# Patient Record
Sex: Female | Born: 1985 | Race: White | Hispanic: No | Marital: Married | Smoking: Never smoker
Health system: Southern US, Community
[De-identification: ages and names within clinical notes are randomized; demographics above are authoritative.]

## PROBLEM LIST (undated history)

## (undated) ENCOUNTER — Inpatient Hospital Stay (HOSPITAL_COMMUNITY): Payer: Self-pay

## (undated) DIAGNOSIS — I10 Essential (primary) hypertension: Secondary | ICD-10-CM

## (undated) DIAGNOSIS — N200 Calculus of kidney: Secondary | ICD-10-CM

## (undated) DIAGNOSIS — O99119 Other diseases of the blood and blood-forming organs and certain disorders involving the immune mechanism complicating pregnancy, unspecified trimester: Secondary | ICD-10-CM

## (undated) DIAGNOSIS — Z803 Family history of malignant neoplasm of breast: Secondary | ICD-10-CM

## (undated) DIAGNOSIS — Z800 Family history of malignant neoplasm of digestive organs: Secondary | ICD-10-CM

## (undated) DIAGNOSIS — F419 Anxiety disorder, unspecified: Secondary | ICD-10-CM

## (undated) DIAGNOSIS — O139 Gestational [pregnancy-induced] hypertension without significant proteinuria, unspecified trimester: Secondary | ICD-10-CM

## (undated) DIAGNOSIS — G43909 Migraine, unspecified, not intractable, without status migrainosus: Secondary | ICD-10-CM

## (undated) DIAGNOSIS — Z808 Family history of malignant neoplasm of other organs or systems: Secondary | ICD-10-CM

## (undated) DIAGNOSIS — K589 Irritable bowel syndrome without diarrhea: Secondary | ICD-10-CM

## (undated) DIAGNOSIS — C4491 Basal cell carcinoma of skin, unspecified: Secondary | ICD-10-CM

## (undated) DIAGNOSIS — D696 Thrombocytopenia, unspecified: Secondary | ICD-10-CM

## (undated) DIAGNOSIS — K219 Gastro-esophageal reflux disease without esophagitis: Secondary | ICD-10-CM

## (undated) DIAGNOSIS — E039 Hypothyroidism, unspecified: Secondary | ICD-10-CM

## (undated) HISTORY — DX: Calculus of kidney: N20.0

## (undated) HISTORY — PX: DENTAL SURGERY: SHX609

## (undated) HISTORY — DX: Family history of malignant neoplasm of breast: Z80.3

## (undated) HISTORY — DX: Migraine, unspecified, not intractable, without status migrainosus: G43.909

## (undated) HISTORY — DX: Family history of malignant neoplasm of other organs or systems: Z80.8

## (undated) HISTORY — DX: Family history of malignant neoplasm of digestive organs: Z80.0

## (undated) HISTORY — DX: Irritable bowel syndrome, unspecified: K58.9

## (undated) HISTORY — PX: BASAL CELL CARCINOMA EXCISION: SHX1214

## (undated) HISTORY — DX: Basal cell carcinoma of skin, unspecified: C44.91

---

## 2007-07-29 ENCOUNTER — Other Ambulatory Visit: Admission: RE | Admit: 2007-07-29 | Discharge: 2007-07-29 | Payer: Self-pay | Admitting: Family Medicine

## 2013-06-13 ENCOUNTER — Encounter: Payer: Self-pay | Admitting: Certified Nurse Midwife

## 2013-06-13 ENCOUNTER — Ambulatory Visit: Payer: Self-pay | Admitting: Certified Nurse Midwife

## 2013-06-14 ENCOUNTER — Ambulatory Visit: Payer: Self-pay | Admitting: Certified Nurse Midwife

## 2013-06-22 ENCOUNTER — Encounter: Payer: Self-pay | Admitting: Certified Nurse Midwife

## 2013-06-22 ENCOUNTER — Ambulatory Visit (INDEPENDENT_AMBULATORY_CARE_PROVIDER_SITE_OTHER): Payer: BC Managed Care – PPO | Admitting: Certified Nurse Midwife

## 2013-06-22 VITALS — BP 100/64 | HR 64 | Resp 16 | Ht 60.25 in | Wt 168.0 lb

## 2013-06-22 DIAGNOSIS — Z23 Encounter for immunization: Secondary | ICD-10-CM

## 2013-06-22 DIAGNOSIS — Z01419 Encounter for gynecological examination (general) (routine) without abnormal findings: Secondary | ICD-10-CM

## 2013-06-22 DIAGNOSIS — IMO0001 Reserved for inherently not codable concepts without codable children: Secondary | ICD-10-CM

## 2013-06-22 DIAGNOSIS — Z309 Encounter for contraceptive management, unspecified: Secondary | ICD-10-CM

## 2013-06-22 DIAGNOSIS — Z0000 Encounter for general adult medical examination without abnormal findings: Secondary | ICD-10-CM

## 2013-06-22 LAB — POCT URINALYSIS DIPSTICK
Bilirubin, UA: NEGATIVE
Leukocytes, UA: NEGATIVE
Nitrite, UA: NEGATIVE
Protein, UA: NEGATIVE
Urobilinogen, UA: NEGATIVE
pH, UA: 5

## 2013-06-22 MED ORDER — NORETHIN ACE-ETH ESTRAD-FE 1-20 MG-MCG PO TABS: 1.0000 | ORAL_TABLET | Freq: Every day | ORAL | Status: DC

## 2013-06-22 NOTE — Patient Instructions (Addendum)
General topics  Next pap or exam is  due in 1 year Take a Women's multivitamin Take 1200 mg. of calcium daily - prefer dietary If any concerns in interim to call back  Breast Self-Awareness Practicing breast self-awareness may pick up problems early, prevent significant medical complications, and possibly save your life. By practicing breast self-awareness, you can become familiar with how your breasts look and feel and if your breasts are changing. This allows you to notice changes early. It can also offer you some reassurance that your breast health is good. One way to learn what is normal for your breasts and whether your breasts are changing is to do a breast self-exam. If you find a lump or something that was not present in the past, it is best to contact your caregiver right away. Other findings that should be evaluated by your caregiver include nipple discharge, especially if it is bloody; skin changes or reddening; areas where the skin seems to be pulled in (retracted); or new lumps and bumps. Breast pain is seldom associated with cancer (malignancy), but should also be evaluated by a caregiver. BREAST SELF-EXAM The best time to examine your breasts is 5 7 days after your menstrual period is over.  Exit[employer] Patient Information 2013 [employer].   Exercise to Stay Healthy Exercise helps you become and stay healthy. EXERCISE IDEAS AND TIPS Choose exercises that:  You enjoy.  Fit into your day. You do not need to exercise really hard to be healthy. You can do exercises at a slow or medium level and stay healthy. You can:  Stretch before and after working out.  Try yoga, Pilates, or tai chi.  Lift weights.  Walk fast, swim, jog, run, climb stairs, bicycle, dance, or rollerskate.  Take aerobic classes. Exercises that burn about 150 calories:  Running 1  miles in 15 minutes.  Playing volleyball for 45 to 60 minutes.  Washing and waxing a car for 45 to 60  minutes.  Playing touch football for 45 minutes.  Walking 1  miles in 35 minutes.  Pushing a stroller 1  miles in 30 minutes.  Playing basketball for 30 minutes.  Raking leaves for 30 minutes.  Bicycling 5 miles in 30 minutes.  Walking 2 miles in 30 minutes.  Dancing for 30 minutes.  Shoveling snow for 15 minutes.  Swimming laps for 20 minutes.  Walking up stairs for 15 minutes.  Bicycling 4 miles in 15 minutes.  Gardening for 30 to 45 minutes.  Jumping rope for 15 minutes.  Washing windows or floors for 45 to 60 minutes. Document Released: 11/08/2010 Document Revised: 12/29/2011 Document Reviewed: 11/08/2010 [employer] Patient Information 2013 [employer].   Other topics ( that may be useful information):    Sexually Transmitted Disease Sexually transmitted disease (STD) refers to any infection that is passed from person to person during sexual activity. This may happen by way of saliva, semen, blood, vaginal mucus, or urine. Common STDs include:  Gonorrhea.  Chlamydia.  Syphilis.  HIV/AIDS.  Genital herpes.  Hepatitis B and C.  Trichomonas.  Human papillomavirus (HPV).  Pubic lice. CAUSES  An STD may be spread by bacteria, virus, or parasite. A person can get an STD by:  Sexual intercourse with an infected person.  Sharing sex toys with an infected person.  Sharing needles with an infected person.  Having intimate contact with the genitals, mouth, or rectal areas of an infected person. SYMPTOMS  Some people may not have any symptoms, but   they can still pass the infection to others. Different STDs have different symptoms. Symptoms include:  Painful or bloody urination.  Pain in the pelvis, abdomen, vagina, anus, throat, or eyes.  Skin rash, itching, irritation, growths, or sores (lesions). These usually occur in the genital or anal area.  Abnormal vaginal discharge.  Penile discharge in men.  Soft, flesh-colored skin growths in the  genital or anal area.  Fever.  Pain or bleeding during sexual intercourse.  Swollen glands in the groin area.  Yellow skin and eyes (jaundice). This is seen with hepatitis. DIAGNOSIS  To make a diagnosis, your caregiver may:  Take a medical history.  Perform a physical exam.  Take a specimen (culture) to be examined.  Examine a sample of discharge under a microscope.  Perform blood test TREATMENT   Chlamydia, gonorrhea, trichomonas, and syphilis can be cured with antibiotic medicine.  Genital herpes, hepatitis, and HIV can be treated, but not cured, with prescribed medicines. The medicines will lessen the symptoms.  Genital warts from HPV can be treated with medicine or by freezing, burning (electrocautery), or surgery. Warts may come back.  HPV is a virus and cannot be cured with medicine or surgery.However, abnormal areas may be followed very closely by your caregiver and may be removed from the cervix, vagina, or vulva through office procedures or surgery. If your diagnosis is confirmed, your recent sexual partners need treatment. This is true even if they are symptom-free or have a negative culture or evaluation. They should not have sex until their caregiver says it is okay. HOME CARE INSTRUCTIONS  All sexual partners should be informed, tested, and treated for all STDs.  Take your antibiotics as directed. Finish them even if you start to feel better.  Only take over-the-counter or prescription medicines for pain, discomfort, or fever as directed by your caregiver.  Rest.  Eat a balanced diet and drink enough fluids to keep your urine clear or pale yellow.  Do not have sex until treatment is completed and you have followed up with your caregiver. STDs should be checked after treatment.  Keep all follow-up appointments, Pap tests, and blood tests as directed by your caregiver.  Only use latex condoms and water-soluble lubricants during sexual activity. Do not use  petroleum jelly or oils.  Avoid alcohol and illegal drugs.  Get vaccinated for HPV and hepatitis. If you have not received these vaccines in the past, talk to your caregiver about whether one or both might be right for you.  Avoid risky sex practices that can break the skin. The only way to avoid getting an STD is to avoid all sexual activity.Latex condoms and dental dams (for oral sex) will help lessen the risk of getting an STD, but will not completely eliminate the risk. SEEK MEDICAL CARE IF:   You have a fever.  You have any new or worsening symptoms. Document Released: 12/27/2002 Document Revised: 12/29/2011 Document Reviewed: 01/03/2011 Exit[employer] Patient Information 2013 [employer].    Domestic Abuse You are being battered or abused if someone close to you hits, pushes, or physically hurts you in any way. You also are being abused if you are forced into activities. You are being sexually abused if you are forced to have sexual contact of any kind. You are being emotionally abused if you are made to feel worthless or if you are constantly threatened. It is important to remember that help is available. No one has the right to abuse you. PREVENTION OF FURTHER   ABUSE  Learn the warning signs of danger. This varies with situations but may include: the use of alcohol, threats, isolation from friends and family, or forced sexual contact. Leave if you feel that violence is going to occur.  If you are attacked or beaten, report it to the police so the abuse is documented. You do not have to press charges. The police can protect you while you or the attackers are leaving. Get the officer's name and badge number and a copy of the report.  Find someone you can trust and tell them what is happening to you: your caregiver, a nurse, clergy member, close friend or family member. Feeling ashamed is natural, but remember that you have done nothing wrong. No one deserves abuse. Document Released:  10/03/2000 Document Revised: 12/29/2011 Document Reviewed: 12/12/2010 Exit[employer] Patient Information 2013 [employer].    How Much is Too Much Alcohol? Drinking too much alcohol can cause injury, accidents, and health problems. These types of problems can include:   Car crashes.  Falls.  Family fighting (domestic violence).  Drowning.  Fights.  Injuries.  Burns.  Damage to certain organs.  Having a baby with birth defects. ONE DRINK CAN BE TOO MUCH WHEN YOU ARE:  Working.  Pregnant or breastfeeding.  Taking medicines. Ask your doctor.  Driving or planning to drive. If you or someone you know has a drinking problem, get help from a doctor.  Document Released: 08/02/2009 Document Revised: 12/29/2011 Document Reviewed: 08/02/2009 Exit[employer] Patient Information 2013 [employer].   Smoking Hazards Smoking cigarettes is extremely bad for your health. Tobacco smoke has over 200 known poisons in it. There are over 60 chemicals in tobacco smoke that cause cancer. Some of the chemicals found in cigarette smoke include:   Cyanide.  Benzene.  Formaldehyde.  Methanol (wood alcohol).  Acetylene (fuel used in welding torches).  Ammonia. Cigarette smoke also contains the poisonous gases nitrogen oxide and carbon monoxide.  Cigarette smokers have an increased risk of many serious medical problems and Smoking causes approximately:  90% of all lung cancer deaths in men.  80% of all lung cancer deaths in women.  90% of deaths from chronic obstructive lung disease. Compared with nonsmokers, smoking increases the risk of:  Coronary heart disease by 2 to 4 times.  Stroke by 2 to 4 times.  Men developing lung cancer by 23 times.  Women developing lung cancer by 13 times.  Dying from chronic obstructive lung diseases by 12 times.  . Smoking is the most preventable cause of death and disease in our society.  WHY IS SMOKING ADDICTIVE?  Nicotine is the chemical  agent in tobacco that is capable of causing addiction or dependence.  When you smoke and inhale, nicotine is absorbed rapidly into the bloodstream through your lungs. Nicotine absorbed through the lungs is capable of creating a powerful addiction. Both inhaled and non-inhaled nicotine may be addictive.  Addiction studies of cigarettes and spit tobacco show that addiction to nicotine occurs mainly during the teen years, when young people begin using tobacco products. WHAT ARE THE BENEFITS OF QUITTING?  There are many health benefits to quitting smoking.   Likelihood of developing cancer and heart disease decreases. Health improvements are seen almost immediately.  Blood pressure, pulse rate, and breathing patterns start returning to normal soon after quitting. QUITTING SMOKING   American Lung Association - 1-800-LUNGUSA  American Cancer Society - 1-800-ACS-2345 Document Released: 11/13/2004 Document Revised: 12/29/2011 Document Reviewed: 07/18/2009 Exit[employer] Patient Information 2013 [employer].   Stress Management Stress is a state of physical or mental tension that often results from changes in your life or normal routine. Some common causes of stress are:  Death of a loved one.  Injuries or severe illnesses.  Getting fired or changing jobs.  Moving into a new home. Other causes may be:  Sexual problems.  Business or financial losses.  Taking on a large debt.  Regular conflict with someone at home or at work.  Constant tiredness from lack of sleep. It is not just bad things that are stressful. It may be stressful to:  Win the lottery.  Get married.  Buy a new car. The amount of stress that can be easily tolerated varies from person to person. Changes generally cause stress, regardless of the types of change. Too much stress can affect your health. It may lead to physical or emotional problems. Too little stress (boredom) may also become stressful. SUGGESTIONS TO  REDUCE STRESS:  Talk things over with your family and friends. It often is helpful to share your concerns and worries. If you feel your problem is serious, you may want to get help from a professional counselor.  Consider your problems one at a time instead of lumping them all together. Trying to take care of everything at once may seem impossible. List all the things you need to do and then start with the most important one. Set a goal to accomplish 2 or 3 things each day. If you expect to do too many in a single day you will naturally fail, causing you to feel even more stressed.  Do not use alcohol or drugs to relieve stress. Although you may feel better for a short time, they do not remove the problems that caused the stress. They can also be habit forming.  Exercise regularly - at least 3 times per week. Physical exercise can help to relieve that "uptight" feeling and will relax you.  The shortest distance between despair and hope is often a good night's sleep.  Go to bed and get up on time allowing yourself time for appointments without being rushed.  Take a short "time-out" period from any stressful situation that occurs during the day. Close your eyes and take some deep breaths. Starting with the muscles in your face, tense them, hold it for a few seconds, then relax. Repeat this with the muscles in your neck, shoulders, hand, stomach, back and legs.  Take good care of yourself. Eat a balanced diet and get plenty of rest.  Schedule time for having fun. Take a break from your daily routine to relax. HOME CARE INSTRUCTIONS   Call if you feel overwhelmed by your problems and feel you can no longer manage them on your own.  Return immediately if you feel like hurting yourself or someone else. Document Released: 04/01/2001 Document Revised: 12/29/2011 Document Reviewed: 11/22/2007 Exit[employer] Patient Information 2013 [employer].       General topics  Next pap or exam is  due in 1  year Take a Women's multivitamin Take 1200 mg. of calcium daily - prefer dietary If any concerns in interim to call back  Breast Self-Awareness Practicing breast self-awareness may pick up problems early, prevent significant medical complications, and possibly save your life. By practicing breast self-awareness, you can become familiar with how your breasts look and feel and if your breasts are changing. This allows you to notice changes early. It can also offer you some reassurance that your breast   health is good. One way to learn what is normal for your breasts and whether your breasts are changing is to do a breast self-exam. If you find a lump or something that was not present in the past, it is best to contact your caregiver right away. Other findings that should be evaluated by your caregiver include nipple discharge, especially if it is bloody; skin changes or reddening; areas where the skin seems to be pulled in (retracted); or new lumps and bumps. Breast pain is seldom associated with cancer (malignancy), but should also be evaluated by a caregiver. BREAST SELF-EXAM The best time to examine your breasts is 5 7 days after your menstrual period is over.  [employer] Patient Information 2013 [employer].   Exercise to Stay Healthy Exercise helps you become and stay healthy. EXERCISE IDEAS AND TIPS Choose exercises that:  You enjoy.  Fit into your day. You do not need to exercise really hard to be healthy. You can do exercises at a slow or medium level and stay healthy. You can:  Stretch before and after working out.  Try yoga, Pilates, or tai chi.  Lift weights.  Walk fast, swim, jog, run, climb stairs, bicycle, dance, or rollerskate.  Take aerobic classes. Exercises that burn about 150 calories:  Running 1  miles in 15 minutes.  Playing volleyball for 45 to 60 minutes.  Washing and waxing a car for 45 to 60 minutes.  Playing touch football for 45 minutes.  Walking 1   miles in 35 minutes.  Pushing a stroller 1  miles in 30 minutes.  Playing basketball for 30 minutes.  Raking leaves for 30 minutes.  Bicycling 5 miles in 30 minutes.  Walking 2 miles in 30 minutes.  Dancing for 30 minutes.  Shoveling snow for 15 minutes.  Swimming laps for 20 minutes.  Walking up stairs for 15 minutes.  Bicycling 4 miles in 15 minutes.  Gardening for 30 to 45 minutes.  Jumping rope for 15 minutes.  Washing windows or floors for 45 to 60 minutes. Document Released: 11/08/2010 Document Revised: 12/29/2011 Document Reviewed: 11/08/2010 Exit[employer] Patient Information 2013 [employer].   Other topics ( that may be useful information):    Sexually Transmitted Disease Sexually transmitted disease (STD) refers to any infection that is passed from person to person during sexual activity. This may happen by way of saliva, semen, blood, vaginal mucus, or urine. Common STDs include:  Gonorrhea.  Chlamydia.  Syphilis.  HIV/AIDS.  Genital herpes.  Hepatitis B and C.  Trichomonas.  Human papillomavirus (HPV).  Pubic lice. CAUSES  An STD may be spread by bacteria, virus, or parasite. A person can get an STD by:  Sexual intercourse with an infected person.  Sharing sex toys with an infected person.  Sharing needles with an infected person.  Having intimate contact with the genitals, mouth, or rectal areas of an infected person. SYMPTOMS  Some people may not have any symptoms, but they can still pass the infection to others. Different STDs have different symptoms. Symptoms include:  Painful or bloody urination.  Pain in the pelvis, abdomen, vagina, anus, throat, or eyes.  Skin rash, itching, irritation, growths, or sores (lesions). These usually occur in the genital or anal area.  Abnormal vaginal discharge.  Penile discharge in men.  Soft, flesh-colored skin growths in the genital or anal area.  Fever.  Pain or bleeding during  sexual intercourse.  Swollen glands in the groin area.  Yellow skin and eyes (jaundice).   This is seen with hepatitis. DIAGNOSIS  To make a diagnosis, your caregiver may:  Take a medical history.  Perform a physical exam.  Take a specimen (culture) to be examined.  Examine a sample of discharge under a microscope.  Perform blood test TREATMENT   Chlamydia, gonorrhea, trichomonas, and syphilis can be cured with antibiotic medicine.  Genital herpes, hepatitis, and HIV can be treated, but not cured, with prescribed medicines. The medicines will lessen the symptoms.  Genital warts from HPV can be treated with medicine or by freezing, burning (electrocautery), or surgery. Warts may come back.  HPV is a virus and cannot be cured with medicine or surgery.However, abnormal areas may be followed very closely by your caregiver and may be removed from the cervix, vagina, or vulva through office procedures or surgery. If your diagnosis is confirmed, your recent sexual partners need treatment. This is true even if they are symptom-free or have a negative culture or evaluation. They should not have sex until their caregiver says it is okay. HOME CARE INSTRUCTIONS  All sexual partners should be informed, tested, and treated for all STDs.  Take your antibiotics as directed. Finish them even if you start to feel better.  Only take over-the-counter or prescription medicines for pain, discomfort, or fever as directed by your caregiver.  Rest.  Eat a balanced diet and drink enough fluids to keep your urine clear or pale yellow.  Do not have sex until treatment is completed and you have followed up with your caregiver. STDs should be checked after treatment.  Keep all follow-up appointments, Pap tests, and blood tests as directed by your caregiver.  Only use latex condoms and water-soluble lubricants during sexual activity. Do not use petroleum jelly or oils.  Avoid alcohol and illegal  drugs.  Get vaccinated for HPV and hepatitis. If you have not received these vaccines in the past, talk to your caregiver about whether one or both might be right for you.  Avoid risky sex practices that can break the skin. The only way to avoid getting an STD is to avoid all sexual activity.Latex condoms and dental dams (for oral sex) will help lessen the risk of getting an STD, but will not completely eliminate the risk. SEEK MEDICAL CARE IF:   You have a fever.  You have any new or worsening symptoms. Document Released: 12/27/2002 Document Revised: 12/29/2011 Document Reviewed: 01/03/2011 Exit[employer] Patient Information 2013 [employer].    Domestic Abuse You are being battered or abused if someone close to you hits, pushes, or physically hurts you in any way. You also are being abused if you are forced into activities. You are being sexually abused if you are forced to have sexual contact of any kind. You are being emotionally abused if you are made to feel worthless or if you are constantly threatened. It is important to remember that help is available. No one has the right to abuse you. PREVENTION OF FURTHER ABUSE  Learn the warning signs of danger. This varies with situations but may include: the use of alcohol, threats, isolation from friends and family, or forced sexual contact. Leave if you feel that violence is going to occur.  If you are attacked or beaten, report it to the police so the abuse is documented. You do not have to press charges. The police can protect you while you or the attackers are leaving. Get the officer's name and badge number and a copy of the report.  Find someone you   can trust and tell them what is happening to you: your caregiver, a nurse, clergy member, close friend or family member. Feeling ashamed is natural, but remember that you have done nothing wrong. No one deserves abuse. Document Released: 10/03/2000 Document Revised: 12/29/2011 Document  Reviewed: 12/12/2010 Exit[employer] Patient Information 2013 [employer].    How Much is Too Much Alcohol? Drinking too much alcohol can cause injury, accidents, and health problems. These types of problems can include:   Car crashes.  Falls.  Family fighting (domestic violence).  Drowning.  Fights.  Injuries.  Burns.  Damage to certain organs.  Having a baby with birth defects. ONE DRINK CAN BE TOO MUCH WHEN YOU ARE:  Working.  Pregnant or breastfeeding.  Taking medicines. Ask your doctor.  Driving or planning to drive. If you or someone you know has a drinking problem, get help from a doctor.  Document Released: 08/02/2009 Document Revised: 12/29/2011 Document Reviewed: 08/02/2009 Exit[employer] Patient Information 2013 [employer].   Smoking Hazards Smoking cigarettes is extremely bad for your health. Tobacco smoke has over 200 known poisons in it. There are over 60 chemicals in tobacco smoke that cause cancer. Some of the chemicals found in cigarette smoke include:   Cyanide.  Benzene.  Formaldehyde.  Methanol (wood alcohol).  Acetylene (fuel used in welding torches).  Ammonia. Cigarette smoke also contains the poisonous gases nitrogen oxide and carbon monoxide.  Cigarette smokers have an increased risk of many serious medical problems and Smoking causes approximately:  90% of all lung cancer deaths in men.  80% of all lung cancer deaths in women.  90% of deaths from chronic obstructive lung disease. Compared with nonsmokers, smoking increases the risk of:  Coronary heart disease by 2 to 4 times.  Stroke by 2 to 4 times.  Men developing lung cancer by 23 times.  Women developing lung cancer by 13 times.  Dying from chronic obstructive lung diseases by 12 times.  . Smoking is the most preventable cause of death and disease in our society.  WHY IS SMOKING ADDICTIVE?  Nicotine is the chemical agent in tobacco that is capable of causing addiction  or dependence.  When you smoke and inhale, nicotine is absorbed rapidly into the bloodstream through your lungs. Nicotine absorbed through the lungs is capable of creating a powerful addiction. Both inhaled and non-inhaled nicotine may be addictive.  Addiction studies of cigarettes and spit tobacco show that addiction to nicotine occurs mainly during the teen years, when young people begin using tobacco products. WHAT ARE THE BENEFITS OF QUITTING?  There are many health benefits to quitting smoking.   Likelihood of developing cancer and heart disease decreases. Health improvements are seen almost immediately.  Blood pressure, pulse rate, and breathing patterns start returning to normal soon after quitting. QUITTING SMOKING   American Lung Association - 1-800-LUNGUSA  American Cancer Society - 1-800-ACS-2345 Document Released: 11/13/2004 Document Revised: 12/29/2011 Document Reviewed: 07/18/2009 Exit[employer] Patient Information 2013 [employer].   Stress Management Stress is a state of physical or mental tension that often results from changes in your life or normal routine. Some common causes of stress are:  Death of a loved one.  Injuries or severe illnesses.  Getting fired or changing jobs.  Moving into a new home. Other causes may be:  Sexual problems.  Business or financial losses.  Taking on a large debt.  Regular conflict with someone at home or at work.  Constant tiredness from lack of sleep. It is not   just bad things that are stressful. It may be stressful to:  Win the lottery.  Get married.  Buy a new car. The amount of stress that can be easily tolerated varies from person to person. Changes generally cause stress, regardless of the types of change. Too much stress can affect your health. It may lead to physical or emotional problems. Too little stress (boredom) may also become stressful. SUGGESTIONS TO REDUCE STRESS:  Talk things over with your family and  friends. It often is helpful to share your concerns and worries. If you feel your problem is serious, you may want to get help from a professional counselor.  Consider your problems one at a time instead of lumping them all together. Trying to take care of everything at once may seem impossible. List all the things you need to do and then start with the most important one. Set a goal to accomplish 2 or 3 things each day. If you expect to do too many in a single day you will naturally fail, causing you to feel even more stressed.  Do not use alcohol or drugs to relieve stress. Although you may feel better for a short time, they do not remove the problems that caused the stress. They can also be habit forming.  Exercise regularly - at least 3 times per week. Physical exercise can help to relieve that "uptight" feeling and will relax you.  The shortest distance between despair and hope is often a good night's sleep.  Go to bed and get up on time allowing yourself time for appointments without being rushed.  Take a short "time-out" period from any stressful situation that occurs during the day. Close your eyes and take some deep breaths. Starting with the muscles in your face, tense them, hold it for a few seconds, then relax. Repeat this with the muscles in your neck, shoulders, hand, stomach, back and legs.  Take good care of yourself. Eat a balanced diet and get plenty of rest.  Schedule time for having fun. Take a break from your daily routine to relax. HOME CARE INSTRUCTIONS   Call if you feel overwhelmed by your problems and feel you can no longer manage them on your own.  Return immediately if you feel like hurting yourself or someone else. Document Released: 04/01/2001 Document Revised: 12/29/2011 Document Reviewed: 11/22/2007 ExitCare Patient Information 2013 [employer].   

## 2013-06-22 NOTE — Progress Notes (Signed)
27 y.o. G0P0000 Married Caucasian Fe here for annual exam. Periods normal no issues. Contraception working well. Planning on trying for pregnancy in the next year.  No health issues today. Plans to start back on weight watchers to help with weight control before pregnancy. Encouraged preconceptual consultation.  Patient's last menstrual period was 05/27/2013.          Sexually active: yes  The current method of family planning is OCP (estrogen/progesterone).    Exercising: yes  cardio, walking & running Smoker:  no  Health Maintenance: Pap:  05-13-11 neg MMG:  none Colonoscopy:  none BMD:   none TDaP:  2004 Labs: Poct urine-neg, Hgb-14.0 Self breast exam: done occ   reports that she has never smoked. She does not have any smokeless tobacco history on file. She reports that she drinks about 1.5 ounces of alcohol per week. She reports that she does not use illicit drugs.  Past Medical History  Diagnosis Date  . IBS (irritable bowel syndrome)   . Migraines     as a child  . Kidney stones     History reviewed. No pertinent past surgical history.  Current Outpatient Prescriptions  Medication Sig Dispense Refill  . Multiple Vitamins-Minerals (MULTIVITAMIN PO) Take by mouth daily.      . norethindrone-ethinyl estradiol (LOESTRIN FE 1/20) 1-20 MG-MCG tablet Take 1 tablet by mouth daily.       No current facility-administered medications for this visit.    Family History  Problem Relation Age of Onset  . Cancer Father     thyroid  . Hypertension Father   . Cancer Paternal Grandmother     colon  . Hypertension Paternal Grandmother   . Hypertension Mother   . Hypertension Brother   . Hypertension Maternal Grandmother   . Heart attack Maternal Grandmother   . Hypertension Maternal Grandfather   . Cancer Paternal Grandfather     brain  . Hypertension Paternal Grandfather     ROS:  Pertinent items are noted in HPI.  Otherwise, a comprehensive ROS was negative.  Exam:   BP  100/64  Pulse 64  Resp 16  Ht 5' 0.25" (1.53 m)  Wt 168 lb (76.204 kg)  BMI 32.55 kg/m2  LMP 05/27/2013 Height: 5' 0.25" (153 cm)  Ht Readings from Last 3 Encounters:  06/22/13 5' 0.25" (1.53 m)    General appearance: alert, cooperative and appears stated age Head: Normocephalic, without obvious abnormality, atraumatic Neck: no adenopathy, supple, symmetrical, trachea midline and thyroid normal to inspection and palpation Lungs: clear to auscultation bilaterally Breasts: normal appearance, no masses or tenderness, No nipple retraction or dimpling, No nipple discharge or bleeding, No axillary or supraclavicular adenopathy Heart: regular rate and rhythm Abdomen: soft, non-tender; no masses,  no organomegaly Extremities: extremities normal, atraumatic, no cyanosis or edema Skin: Skin color, texture, turgor normal. No rashes or lesions Lymph nodes: Cervical, supraclavicular, and axillary nodes normal. No abnormal inguinal nodes palpated Neurologic: Grossly normal   Pelvic: External genitalia:  no lesions              Urethra:  normal appearing urethra with no masses, tenderness or lesions              Bartholin's and Skene's: normal                 Vagina: normal appearing vagina with normal color and discharge, no lesions              Cervix: normal,non tender  Pap taken: yes Bimanual Exam:  Uterus:  normal size, contour, position, consistency, mobility, non-tender and anteverted              Adnexa: normal adnexa and no mass, fullness, tenderness               Rectovaginal: Confirms               Anus:  Deferred  A:  Well Woman with normal exam  Contraception OCP  Planning pregnancy in next year  TDAP due  P:   Reviewed health and wellness pertinent to exam  RxLoestrin 1/20 Fe see order  Encouraged preconceptual consult, start on prenatal vitamins 3 months before trying for pregnancy  Lab: Rubella titer  Requests TDAP  Pap smear as per guidelines   pap smear  taken today  counseled on breast self exam, use and side effects of OCP's, adequate intake of calcium and vitamin D, diet and exercise  return annually or prn  An After Visit Summary was printed and given to the patient.

## 2013-06-23 LAB — RUBELLA SCREEN: Rubella: 1.81 Index — ABNORMAL HIGH (ref <0.90)

## 2013-06-27 NOTE — Progress Notes (Signed)
aex is 9/15

## 2013-06-29 NOTE — Progress Notes (Signed)
Note reviewed, agree with plan.  Ariyel Jeangilles, MD  

## 2013-06-30 ENCOUNTER — Telehealth: Payer: Self-pay

## 2013-06-30 NOTE — Telephone Encounter (Signed)
Per PG,let patient know that TSH is good and Rubella shows immunity. Continue with weight loss for possible pregnancy this next year. Pt notified of results

## 2013-08-22 ENCOUNTER — Telehealth: Payer: Self-pay | Admitting: Certified Nurse Midwife

## 2013-08-22 NOTE — Telephone Encounter (Signed)
Patient has taken two positive OTC pregnancy tests. Patient wants to speak with nurse about some questions she has before scheduling an appointment with [place]. Patient is having her wisdom teeth removed next week.

## 2013-08-22 NOTE — Telephone Encounter (Signed)
Spoke with pt who has had 2 + UPT's. LMP approx. 07-17-13. Pt is supposed to have 4 wisdom teeth extracted next week. Advised pt to call oral surgeon's office to let them know, as they may not want to do the surgery since she is pregnant. Pt agreeable. Sched OV to confirm 08-26-13 at 11:15 per pt request.

## 2013-08-26 ENCOUNTER — Encounter: Payer: Self-pay | Admitting: Certified Nurse Midwife

## 2013-08-26 ENCOUNTER — Ambulatory Visit (INDEPENDENT_AMBULATORY_CARE_PROVIDER_SITE_OTHER): Payer: BC Managed Care – PPO | Admitting: Certified Nurse Midwife

## 2013-08-26 VITALS — BP 110/70 | HR 84 | Resp 18 | Wt 173.0 lb

## 2013-08-26 DIAGNOSIS — Z3201 Encounter for pregnancy test, result positive: Secondary | ICD-10-CM

## 2013-08-26 DIAGNOSIS — N912 Amenorrhea, unspecified: Secondary | ICD-10-CM

## 2013-08-26 NOTE — Progress Notes (Signed)
27 y.o. Married Caucasian female G0P0000 here with complaint  up of amenorrhea with positive home pregnancy test. Spouse present with patient. Planned pregnancy very happy about news. Patient denies vaginal bleeding or cramping. Occasional nausea, but mainly fatigue. Patient recently being treated for infection in gum and on area above the left eye with Amoxicillin and Keflex. Oral issues better and skin issues resolving. Some breast tenderness, over all feels well. LMP 07-17-13. Denies any other problems or medication or alcohol use or smoking. Eating well, has decreased caffeine use now. Taking daily prenatal vitamins  Recent Rubella screen positive immune Tdap updated 06/22/13 here Aex with normal pap 06/22/13 here  O: Healthy WD,WN female Affect: normal    A:Amenorrhea with positive UPT here [facility] 04-23-13 , 5wk 4d per LMP only, planned pregnancy 2-Recent treatment for infection in gum and on skin above the left eye   P: Reviewed importance of prenatal care and good nutrition for pregnancy. Given referral list of OB providers and encouraged appointment by 8 weeks. Discussed warning signs of early pregnancy and need to advise. Reviewed comfort measures for early pregnancy. Offered viability PUS, declines. Questions addressed at length. Given OB packet. Patient to call with appointment so records can be sent. Discussed flu vaccine protection. Wished well with pregnancy   RV prn

## 2013-08-28 NOTE — Progress Notes (Signed)
Note reviewed, agree with plan.  Marcelline Temkin, MD  

## 2013-09-13 LAB — OB RESULTS CONSOLE HIV ANTIBODY (ROUTINE TESTING): HIV: NONREACTIVE

## 2013-09-13 LAB — OB RESULTS CONSOLE RPR: RPR: NONREACTIVE

## 2013-09-13 LAB — OB RESULTS CONSOLE GC/CHLAMYDIA
CHLAMYDIA, DNA PROBE: NEGATIVE
GC PROBE AMP, GENITAL: NEGATIVE

## 2013-09-13 LAB — OB RESULTS CONSOLE ABO/RH: RH Type: POSITIVE

## 2013-09-13 LAB — OB RESULTS CONSOLE HEPATITIS B SURFACE ANTIGEN: Hepatitis B Surface Ag: NEGATIVE

## 2013-09-13 LAB — OB RESULTS CONSOLE ANTIBODY SCREEN: Antibody Screen: NEGATIVE

## 2013-10-20 NOTE — L&D Delivery Note (Signed)
Delivery Note At 10:25 AM a viable female was delivered via Vaginal, Spontaneous Delivery (Presentation: Occiput Anterior).  APGAR: 8, 9; weight 5 lb 5 oz (2410 g).   Placenta status: Intact, Spontaneous.  Cord: 3 vessels with the following complications: None.  Cord pH: NA  Anesthesia: Epidural  Episiotomy: None Lacerations: 2nd degree;Vaginal Suture Repair: 3.0 chromic Est. Blood Loss (mL): 450  Mom to AICU.  Baby to Couplet care / Skin to Skin.  Magnesium Sulfate to continue for 24 hours post delivery  Farrel Gordon 04/14/2014, 11:23 AM

## 2014-03-18 ENCOUNTER — Inpatient Hospital Stay (HOSPITAL_COMMUNITY)
Admission: AD | Admit: 2014-03-18 | Discharge: 2014-03-18 | Disposition: A | Discharge status: Home / Self Care | Payer: BC Managed Care – PPO | Source: Ambulatory Visit | Attending: Obstetrics and Gynecology | Admitting: Obstetrics and Gynecology

## 2014-03-18 ENCOUNTER — Encounter (HOSPITAL_COMMUNITY): Payer: Self-pay | Admitting: *Deleted

## 2014-03-18 DIAGNOSIS — O212 Late vomiting of pregnancy: Secondary | ICD-10-CM | POA: Insufficient documentation

## 2014-03-18 DIAGNOSIS — K589 Irritable bowel syndrome without diarrhea: Secondary | ICD-10-CM | POA: Diagnosis not present

## 2014-03-18 DIAGNOSIS — R42 Dizziness and giddiness: Secondary | ICD-10-CM | POA: Diagnosis present

## 2014-03-18 DIAGNOSIS — R197 Diarrhea, unspecified: Secondary | ICD-10-CM | POA: Insufficient documentation

## 2014-03-18 LAB — URINE MICROSCOPIC-ADD ON

## 2014-03-18 LAB — URINALYSIS, ROUTINE W REFLEX MICROSCOPIC
BILIRUBIN URINE: NEGATIVE
Glucose, UA: NEGATIVE mg/dL
Hgb urine dipstick: NEGATIVE
KETONES UR: 15 mg/dL — AB
NITRITE: NEGATIVE
PROTEIN: NEGATIVE mg/dL
Specific Gravity, Urine: 1.02 (ref 1.005–1.030)
UROBILINOGEN UA: 0.2 mg/dL (ref 0.0–1.0)
pH: 6.5 (ref 5.0–8.0)

## 2014-03-18 MED ORDER — PROMETHAZINE HCL 12.5 MG PO TABS: 12.5000 mg | ORAL_TABLET | Freq: Four times a day (QID) | ORAL | Status: DC | PRN

## 2014-03-18 MED ORDER — ONDANSETRON 4 MG PO TBDP
4.0000 mg | ORAL_TABLET | Freq: Once | ORAL | Status: AC
  Administered 2014-03-18: 4 mg via ORAL
  Filled 2014-03-18: qty 1

## 2014-03-18 NOTE — Discharge Instructions (Signed)
Viral Gastroenteritis Viral gastroenteritis is also known as stomach flu. This condition affects the stomach and intestinal tract. It can cause sudden diarrhea and vomiting. The illness typically lasts 3 to 8 days. Most people develop an immune response that eventually gets rid of the virus. While this natural response develops, the virus can make you quite ill. CAUSES  Many different viruses can cause gastroenteritis, such as rotavirus or noroviruses. You can catch one of these viruses by consuming contaminated food or water. You may also catch a virus by sharing utensils or other personal items with an infected person or by touching a contaminated surface. SYMPTOMS  The most common symptoms are diarrhea and vomiting. These problems can cause a severe loss of body fluids (dehydration) and a body salt (electrolyte) imbalance. Other symptoms may include:  Fever.  Headache.  Fatigue.  Abdominal pain. DIAGNOSIS  Your caregiver can usually diagnose viral gastroenteritis based on your symptoms and a physical exam. A stool sample may also be taken to test for the presence of viruses or other infections. TREATMENT  This illness typically goes away on its own. Treatments are aimed at rehydration. The most serious cases of viral gastroenteritis involve vomiting so severely that you are not able to keep fluids down. In these cases, fluids must be given through an intravenous line (IV). HOME CARE INSTRUCTIONS   Drink enough fluids to keep your urine clear or pale yellow. Drink small amounts of fluids frequently and increase the amounts as tolerated.  Ask your caregiver for specific rehydration instructions.  Avoid:  Foods high in sugar.  Alcohol.  Carbonated drinks.  Tobacco.  Juice.  Caffeine drinks.  Extremely hot or cold fluids.  Fatty, greasy foods.  Too much intake of anything at one time.  Dairy products until 24 to 48 hours after diarrhea stops.  You may consume probiotics.  Probiotics are active cultures of beneficial bacteria. They may lessen the amount and number of diarrheal stools in adults. Probiotics can be found in yogurt with active cultures and in supplements.  Wash your hands well to avoid spreading the virus.  Only take over-the-counter or prescription medicines for pain, discomfort, or fever as directed by your caregiver. Do not give aspirin to children. Antidiarrheal medicines are not recommended.  Ask your caregiver if you should continue to take your regular prescribed and over-the-counter medicines.  Keep all follow-up appointments as directed by your caregiver. SEEK IMMEDIATE MEDICAL CARE IF:   You are unable to keep fluids down.  You do not urinate at least once every 6 to 8 hours.  You develop shortness of breath.  You notice blood in your stool or vomit. This may look like coffee grounds.  You have abdominal pain that increases or is concentrated in one small area (localized).  You have persistent vomiting or diarrhea.  You have a fever.  The patient is a child younger than 3 months, and he or she has a fever.  The patient is a child older than 3 months, and he or she has a fever and persistent symptoms.  The patient is a child older than 3 months, and he or she has a fever and symptoms suddenly get worse.  The patient is a baby, and he or she has no tears when crying. MAKE SURE YOU:   Understand these instructions.  Will watch your condition.  Will get help right away if you are not doing well or get worse. Document Released: 10/06/2005 Document Revised: 12/29/2011 Document Reviewed: 07/23/2011  or she has no tears when crying.  MAKE SURE YOU:   · Understand these instructions.  · Will watch your condition.  · Will get help right away if you are not doing well or get worse.  Document Released: 10/06/2005 Document Revised: 12/29/2011 Document Reviewed: 07/23/2011  ExitCare® Patient Information ©2014 [employer].  Diet for Diarrhea, Adult  Frequent, runny stools (diarrhea) may be caused or worsened by food or drink. Diarrhea may be relieved by changing your diet. Since diarrhea can last up to 7 days, it is easy for you to lose too much fluid from the body and become dehydrated. Fluids that are  lost need to be replaced. Along with a modified diet, make sure you drink enough fluids to keep your urine clear or pale yellow.  DIET INSTRUCTIONS  · Ensure adequate fluid intake (hydration): have 1 cup (8 oz) of fluid for each diarrhea episode. Avoid fluids that contain simple sugars or sports drinks, fruit juices, whole milk products, and sodas. Your urine should be clear or pale yellow if you are drinking enough fluids. Hydrate with an oral rehydration solution that you can purchase at pharmacies, retail stores, and online. You can prepare an oral rehydration solution at home by mixing the following ingredients together:  ·   tsp table salt.  · ¾ tsp baking soda.  ·  tsp salt substitute containing potassium chloride.  · 1  tablespoons sugar.  · 1 L (34 oz) of water.  · Certain foods and beverages may increase the speed at which food moves through the gastrointestinal (GI) tract. These foods and beverages should be avoided and include:  · Caffeinated and alcoholic beverages.  · High-fiber foods, such as raw fruits and vegetables, nuts, seeds, and whole grain breads and cereals.  · Foods and beverages sweetened with sugar alcohols, such as xylitol, sorbitol, and mannitol.  · Some foods may be well tolerated and may help thicken stool including:  · Starchy foods, such as rice, toast, pasta, low-sugar cereal, oatmeal, grits, baked potatoes, crackers, and bagels.    · Bananas.    · Applesauce.  · Add probiotic-rich foods to help increase healthy bacteria in the GI tract, such as yogurt and fermented milk products.  RECOMMENDED FOODS AND BEVERAGES  Starches  Choose foods with less than 2 g of fiber per serving.  · Recommended:  White, French, and pita breads, plain rolls, buns, bagels. Plain muffins, matzo. Soda, saltine, or graham crackers. Pretzels, melba toast, zwieback. Cooked cereals made with water: cornmeal, farina, cream cereals. Dry cereals: refined corn, wheat, rice. Potatoes prepared any way without skins,  refined macaroni, spaghetti, noodles, refined rice.  · Avoid:  Bread, rolls, or crackers made with whole wheat, multi-grains, rye, bran seeds, nuts, or coconut. Corn tortillas or taco shells. Cereals containing whole grains, multi-grains, bran, coconut, nuts, raisins. Cooked or dry oatmeal. Coarse wheat cereals, granola. Cereals advertised as "high-fiber." Potato skins. Whole grain pasta, wild or brown rice. Popcorn. Sweet potatoes, yams. Sweet rolls, doughnuts, waffles, pancakes, sweet breads.  Vegetables  · Recommended: Strained tomato and vegetable juices. Most well-cooked and canned vegetables without seeds. Fresh: Tender lettuce, cucumber without the skin, cabbage, spinach, bean sprouts.  · Avoid: Fresh, cooked, or canned: Artichokes, baked beans, beet greens, broccoli, Brussels sprouts, corn, kale, legumes, peas, sweet potatoes. Cooked: Green or red cabbage, spinach. Avoid large servings of any vegetables because vegetables shrink when cooked, and they contain more fiber per serving than fresh vegetables.  Fruit  · Recommended: Cooked   or canned: Apricots, applesauce, cantaloupe, cherries, fruit cocktail, grapefruit, grapes, kiwi, mandarin oranges, peaches, pears, plums, watermelon. Fresh: Apples without skin, ripe banana, grapes, cantaloupe, cherries, grapefruit, peaches, oranges, plums. Keep servings limited to ½ cup or 1 piece.  · Avoid: Fresh: Apples with skin, apricots, mangoes, pears, raspberries, strawberries. Prune juice, stewed or dried prunes. Dried fruits, raisins, dates. Large servings of all fresh fruits.  Protein  · Recommended: Ground or well-cooked tender beef, ham, veal, lamb, pork, or poultry. Eggs. Fish, oysters, shrimp, lobster, other seafoods. Liver, organ meats.  · Avoid: Tough, fibrous meats with gristle. Peanut butter, smooth or chunky. Cheese, nuts, seeds, legumes, dried peas, beans, lentils.  Dairy  · Recommended: Yogurt, lactose-free milk, kefir, drinkable yogurt, buttermilk, soy  milk, or plain hard cheese.  · Avoid: Milk, chocolate milk, beverages made with milk, such as milkshakes.  Soups  · Recommended: Bouillon, broth, or soups made from allowed foods. Any strained soup.  · Avoid: Soups made from vegetables that are not allowed, cream or milk-based soups.  Desserts and Sweets  · Recommended: Sugar-free gelatin, sugar-free frozen ice pops made without sugar alcohol.  · Avoid: Plain cakes and cookies, pie made with fruit, pudding, custard, cream pie. Gelatin, fruit, ice, sherbet, frozen ice pops. Ice cream, ice milk without nuts. Plain hard candy, honey, jelly, molasses, syrup, sugar, chocolate syrup, gumdrops, marshmallows.  Fats and Oils  · Recommended: Limit fats to less than 8 tsp per day.  · Avoid: Seeds, nuts, olives, avocados. Margarine, butter, cream, mayonnaise, salad oils, plain salad dressings. Plain gravy, crisp bacon without rind.  Beverages  · Recommended: Water, decaffeinated teas, oral rehydration solutions, sugar-free beverages not sweetened with sugar alcohols.  · Avoid: Fruit juices, caffeinated beverages (coffee, tea, soda), alcohol, sports drinks, or lemon-lime soda.  Condiments  · Recommended: Ketchup, mustard, horseradish, vinegar, cocoa powder. Spices in moderation: allspice, basil, bay leaves, celery powder or leaves, cinnamon, cumin powder, curry powder, ginger, mace, marjoram, onion or garlic powder, oregano, paprika, parsley flakes, ground pepper, rosemary, sage, savory, tarragon, thyme, turmeric.  · Avoid: Coconut, honey.  Document Released: 12/27/2003 Document Revised: 06/30/2012 Document Reviewed: 02/20/2012  ExitCare® Patient Information ©2014 [employer].

## 2014-03-18 NOTE — MAU Provider Note (Signed)
History   28yo, G1P0 at [redacted]w[redacted]d presents to [facility]U, she had a baby shower today and around 5 pm started to feel bad with lightheadedness, diarrhea and N/V beginning around 8pm this evening. Diarrhea x 2-3 and vomiting x1.  Denies anyone around her being sick with similar symptoms.  Denies VB, UCs, recent fever, resp c/o's, UTI or PIH s/s. GFM.   There are no active problems to display for this patient.   Chief Complaint  Patient presents with  . Nausea  . Emesis During Pregnancy   HPI  OB History   Grav Para Term Preterm Abortions TAB SAB Ect Mult Living   1 0 0 0 0 0 0 0 0 0       Past Medical History  Diagnosis Date  . IBS (irritable bowel syndrome)   . Migraines     as a child  . Kidney stones     History reviewed. No pertinent past surgical history.  Family History  Problem Relation Age of Onset  . Cancer Father     thyroid  . Hypertension Father   . Cancer Paternal Grandmother     colon  . Hypertension Paternal Grandmother   . Hypertension Mother   . Hypertension Brother   . Hypertension Maternal Grandmother   . Heart attack Maternal Grandmother   . Hypertension Maternal Grandfather   . Cancer Paternal Grandfather     brain  . Hypertension Paternal Grandfather     History  Substance Use Topics  . Smoking status: Never Smoker   . Smokeless tobacco: Never Used  . Alcohol Use: 1.5 - 2.0 oz/week    3-4 drink(s) per week     Comment: occ glass of wine    Allergies: No Known Allergies  Prescriptions prior to admission  Medication Sig Dispense Refill  . Prenatal Vit-Fe Fumarate-FA (PRENATAL MULTIVITAMIN) TABS tablet Take 1 tablet by mouth daily at 12 noon.      . ranitidine (ZANTAC) 150 MG tablet Take 150 mg by mouth 2 (two) times daily as needed for heartburn.        ROS ROS: see HPI above, all other systems are negative  Physical Exam   Blood pressure 136/85, temperature 98.2 F (36.8 C), temperature source Oral, resp. rate 20, last menstrual period  07/17/2013, SpO2 98.00%.  Physical Exam Chest: Clear Heart: RRR Abdomen: gravid, NT Extremities: WNL  FHT: NST reactive UCs: none  ED Course  IUP at [redacted]w[redacted]d ?Viral syndrome with N/V/D  Zofran 4mg  ODT once - reports little relief PO hydration - well tolerated with no emesis  D/c home with precautions Phenergan 12.5 mg PO Q 6 hour PRN     Linda Hedges CNM, MSN 03/18/2014 9:24 PM

## 2014-03-18 NOTE — MAU Note (Signed)
Had a baby shower today and around 5 pm started to feel bad, lightheaded and back pain. Diarrhea and vomiting began around 8pm this evening. Diarrhea x 2-3 and vomiting x1.

## 2014-03-29 LAB — OB RESULTS CONSOLE GBS: STREP GROUP B AG: POSITIVE

## 2014-04-12 ENCOUNTER — Encounter (HOSPITAL_COMMUNITY): Payer: Self-pay | Admitting: *Deleted

## 2014-04-12 ENCOUNTER — Inpatient Hospital Stay (HOSPITAL_COMMUNITY)
Admission: AD | Admit: 2014-04-12 | Discharge: 2014-04-16 | DRG: 774 | Disposition: A | Discharge status: Home / Self Care | Payer: BC Managed Care – PPO | Source: Ambulatory Visit | Attending: Obstetrics and Gynecology | Admitting: Obstetrics and Gynecology

## 2014-04-12 DIAGNOSIS — K589 Irritable bowel syndrome without diarrhea: Secondary | ICD-10-CM | POA: Diagnosis present

## 2014-04-12 DIAGNOSIS — O365990 Maternal care for other known or suspected poor fetal growth, unspecified trimester, not applicable or unspecified: Secondary | ICD-10-CM | POA: Diagnosis present

## 2014-04-12 DIAGNOSIS — O9903 Anemia complicating the puerperium: Secondary | ICD-10-CM | POA: Diagnosis present

## 2014-04-12 DIAGNOSIS — O99892 Other specified diseases and conditions complicating childbirth: Secondary | ICD-10-CM | POA: Diagnosis present

## 2014-04-12 DIAGNOSIS — B951 Streptococcus, group B, as the cause of diseases classified elsewhere: Secondary | ICD-10-CM | POA: Diagnosis present

## 2014-04-12 DIAGNOSIS — Z22330 Carrier of Group B streptococcus: Secondary | ICD-10-CM

## 2014-04-12 DIAGNOSIS — Z87442 Personal history of urinary calculi: Secondary | ICD-10-CM | POA: Diagnosis not present

## 2014-04-12 DIAGNOSIS — O1490 Unspecified pre-eclampsia, unspecified trimester: Secondary | ICD-10-CM | POA: Diagnosis present

## 2014-04-12 DIAGNOSIS — D649 Anemia, unspecified: Secondary | ICD-10-CM | POA: Diagnosis present

## 2014-04-12 DIAGNOSIS — Z8249 Family history of ischemic heart disease and other diseases of the circulatory system: Secondary | ICD-10-CM

## 2014-04-12 DIAGNOSIS — IMO0002 Reserved for concepts with insufficient information to code with codable children: Principal | ICD-10-CM | POA: Diagnosis present

## 2014-04-12 DIAGNOSIS — O9989 Other specified diseases and conditions complicating pregnancy, childbirth and the puerperium: Secondary | ICD-10-CM

## 2014-04-12 DIAGNOSIS — C4491 Basal cell carcinoma of skin, unspecified: Secondary | ICD-10-CM | POA: Diagnosis not present

## 2014-04-12 LAB — COMPREHENSIVE METABOLIC PANEL
ALK PHOS: 144 U/L — AB (ref 39–117)
ALT: 11 U/L (ref 0–35)
ALT: 12 U/L (ref 0–35)
AST: 17 U/L (ref 0–37)
AST: 17 U/L (ref 0–37)
Albumin: 2.6 g/dL — ABNORMAL LOW (ref 3.5–5.2)
Albumin: 2.7 g/dL — ABNORMAL LOW (ref 3.5–5.2)
Alkaline Phosphatase: 133 U/L — ABNORMAL HIGH (ref 39–117)
BUN: 14 mg/dL (ref 6–23)
BUN: 13 mg/dL (ref 6–23)
CALCIUM: 9.3 mg/dL (ref 8.4–10.5)
CO2: 18 meq/L — AB (ref 19–32)
CO2: 18 mEq/L — ABNORMAL LOW (ref 19–32)
Calcium: 9.4 mg/dL (ref 8.4–10.5)
Chloride: 101 mEq/L (ref 96–112)
Chloride: 100 mEq/L (ref 96–112)
Creatinine, Ser: 0.93 mg/dL (ref 0.50–1.10)
Creatinine, Ser: 0.96 mg/dL (ref 0.50–1.10)
GFR calc Af Amer: >90 mL/min (ref >90)
GFR calc Af Amer: >90 mL/min (ref >90)
GFR calc non Af Amer: 82 mL/min — ABNORMAL LOW (ref >90)
GFR calc non Af Amer: 79 mL/min — ABNORMAL LOW (ref >90)
Glucose, Bld: 85 mg/dL (ref 70–99)
Glucose, Bld: 76 mg/dL (ref 70–99)
Potassium: 4 mEq/L (ref 3.7–5.3)
Potassium: 4 mEq/L (ref 3.7–5.3)
SODIUM: 132 meq/L — AB (ref 137–147)
Sodium: 133 mEq/L — ABNORMAL LOW (ref 137–147)
TOTAL PROTEIN: 6.6 g/dL (ref 6.0–8.3)
TOTAL PROTEIN: 6.6 g/dL (ref 6.0–8.3)
Total Bilirubin: <0.2 mg/dL — ABNORMAL LOW (ref 0.3–1.2)
Total Bilirubin: 0.2 mg/dL — ABNORMAL LOW (ref 0.3–1.2)

## 2014-04-12 LAB — URINALYSIS, ROUTINE W REFLEX MICROSCOPIC
BILIRUBIN URINE: NEGATIVE
Glucose, UA: NEGATIVE mg/dL
Ketones, ur: 15 mg/dL — AB
Nitrite: NEGATIVE
PH: 6 (ref 5.0–8.0)
Protein, ur: 100 mg/dL — AB
SPECIFIC GRAVITY, URINE: 1.025 (ref 1.005–1.030)
Urobilinogen, UA: 0.2 mg/dL (ref 0.0–1.0)

## 2014-04-12 LAB — CBC
HCT: 36.1 % (ref 36.0–46.0)
HEMATOCRIT: 37.3 % (ref 36.0–46.0)
HEMOGLOBIN: 12.9 g/dL (ref 12.0–15.0)
Hemoglobin: 12.4 g/dL (ref 12.0–15.0)
MCH: 30.2 pg (ref 26.0–34.0)
MCH: 30.3 pg (ref 26.0–34.0)
MCHC: 34.3 g/dL (ref 30.0–36.0)
MCHC: 34.6 g/dL (ref 30.0–36.0)
MCV: 87.8 fL (ref 78.0–100.0)
MCV: 87.6 fL (ref 78.0–100.0)
PLATELETS: 117 10*3/uL — AB (ref 150–400)
Platelets: 130 10*3/uL — ABNORMAL LOW (ref 150–400)
RBC: 4.11 MIL/uL (ref 3.87–5.11)
RBC: 4.26 MIL/uL (ref 3.87–5.11)
RDW: 13 % (ref 11.5–15.5)
RDW: 13.2 % (ref 11.5–15.5)
WBC: 9.7 10*3/uL (ref 4.0–10.5)
WBC: 12.6 10*3/uL — ABNORMAL HIGH (ref 4.0–10.5)

## 2014-04-12 LAB — PROTEIN / CREATININE RATIO, URINE
CREATININE, URINE: 140.74 mg/dL
Protein Creatinine Ratio: 0.51 — ABNORMAL HIGH (ref 0.00–0.15)
Total Protein, Urine: 71.5 mg/dL

## 2014-04-12 LAB — URINE MICROSCOPIC-ADD ON

## 2014-04-12 LAB — LACTATE DEHYDROGENASE
LDH: 211 U/L (ref 94–250)
LDH: 226 U/L (ref 94–250)

## 2014-04-12 LAB — MAGNESIUM: MAGNESIUM: 1.7 mg/dL (ref 1.5–2.5)

## 2014-04-12 LAB — URIC ACID
URIC ACID, SERUM: 8.9 mg/dL — AB (ref 2.4–7.0)
Uric Acid, Serum: 9 mg/dL — ABNORMAL HIGH (ref 2.4–7.0)

## 2014-04-12 MED ORDER — FLEET ENEMA 7-19 GM/118ML RE ENEM: 1.0000 | ENEMA | RECTAL | Status: DC | PRN

## 2014-04-12 MED ORDER — TERBUTALINE SULFATE 1 MG/ML IJ SOLN: 0.2500 mg | Freq: Once | INTRAMUSCULAR | Status: AC | PRN

## 2014-04-12 MED ORDER — MISOPROSTOL 25 MCG QUARTER TABLET
25.0000 ug | ORAL_TABLET | ORAL | Status: DC | PRN
  Administered 2014-04-12 – 2014-04-13 (×4): 25 ug via VAGINAL
  Filled 2014-04-12 (×3): qty 0.25
  Filled 2014-04-12: qty 1
  Filled 2014-04-12: qty 0.25

## 2014-04-12 MED ORDER — PENICILLIN G POTASSIUM 5000000 UNITS IJ SOLR: 5.0000 10*6.[IU] | Freq: Once | INTRAVENOUS | Status: DC

## 2014-04-12 MED ORDER — ACETAMINOPHEN 325 MG PO TABS: 650.0000 mg | ORAL_TABLET | ORAL | Status: DC | PRN

## 2014-04-12 MED ORDER — CITRIC ACID-SODIUM CITRATE 334-500 MG/5ML PO SOLN: 30.0000 mL | ORAL | Status: DC | PRN

## 2014-04-12 MED ORDER — MAGNESIUM SULFATE BOLUS VIA INFUSION
4.0000 g | Freq: Once | INTRAVENOUS | Status: AC
  Administered 2014-04-12: 4 g via INTRAVENOUS
  Filled 2014-04-12: qty 500

## 2014-04-12 MED ORDER — OXYTOCIN 40 UNITS IN LACTATED RINGERS INFUSION - SIMPLE MED
1.0000 m[IU]/min | INTRAVENOUS | Status: DC
  Administered 2014-04-13: 2 m[IU]/min via INTRAVENOUS
  Filled 2014-04-12: qty 1000

## 2014-04-12 MED ORDER — ZOLPIDEM TARTRATE 5 MG PO TABS
5.0000 mg | ORAL_TABLET | Freq: Every evening | ORAL | Status: DC | PRN
  Administered 2014-04-12: 5 mg via ORAL
  Filled 2014-04-12: qty 1

## 2014-04-12 MED ORDER — PENICILLIN G POTASSIUM 5000000 UNITS IJ SOLR: 2.5000 10*6.[IU] | INTRAVENOUS | Status: DC

## 2014-04-12 MED ORDER — IBUPROFEN 600 MG PO TABS: 600.0000 mg | ORAL_TABLET | Freq: Four times a day (QID) | ORAL | Status: DC | PRN

## 2014-04-12 MED ORDER — OXYCODONE-ACETAMINOPHEN 5-325 MG PO TABS: 1.0000 | ORAL_TABLET | ORAL | Status: DC | PRN

## 2014-04-12 MED ORDER — LIDOCAINE HCL (PF) 1 % IJ SOLN
30.0000 mL | INTRAMUSCULAR | Status: DC | PRN
  Filled 2014-04-12: qty 30

## 2014-04-12 MED ORDER — PENICILLIN G POTASSIUM 5000000 UNITS IJ SOLR
2.5000 10*6.[IU] | INTRAVENOUS | Status: DC
  Administered 2014-04-13 – 2014-04-14 (×8): 2.5 10*6.[IU] via INTRAVENOUS
  Filled 2014-04-12 (×10): qty 2.5

## 2014-04-12 MED ORDER — PENICILLIN G POTASSIUM 5000000 UNITS IJ SOLR
5.0000 10*6.[IU] | Freq: Once | INTRAMUSCULAR | Status: AC
  Administered 2014-04-12: 5 10*6.[IU] via INTRAVENOUS
  Filled 2014-04-12: qty 5

## 2014-04-12 MED ORDER — ONDANSETRON HCL 4 MG/2ML IJ SOLN
4.0000 mg | Freq: Four times a day (QID) | INTRAMUSCULAR | Status: DC | PRN
  Administered 2014-04-14: 4 mg via INTRAVENOUS
  Filled 2014-04-12: qty 2

## 2014-04-12 MED ORDER — OXYTOCIN BOLUS FROM INFUSION: 500.0000 mL | INTRAVENOUS | Status: DC

## 2014-04-12 MED ORDER — OXYTOCIN 40 UNITS IN LACTATED RINGERS INFUSION - SIMPLE MED: 62.5000 mL/h | INTRAVENOUS | Status: DC

## 2014-04-12 MED ORDER — LABETALOL HCL 5 MG/ML IV SOLN: 10.0000 mg | INTRAVENOUS | Status: DC | PRN

## 2014-04-12 MED ORDER — LACTATED RINGERS IV SOLN
INTRAVENOUS | Status: DC
  Administered 2014-04-12 – 2014-04-13 (×2): via INTRAVENOUS

## 2014-04-12 MED ORDER — BUTORPHANOL TARTRATE 1 MG/ML IJ SOLN
1.0000 mg | INTRAMUSCULAR | Status: DC | PRN
  Administered 2014-04-13: 1 mg via INTRAVENOUS
  Filled 2014-04-12: qty 1

## 2014-04-12 MED ORDER — MAGNESIUM SULFATE 40 G IN LACTATED RINGERS - SIMPLE
1.0000 g/h | INTRAVENOUS | Status: DC
  Administered 2014-04-13: 2 g/h via INTRAVENOUS
  Filled 2014-04-12 (×2): qty 500

## 2014-04-12 MED ORDER — LACTATED RINGERS IV SOLN: 500.0000 mL | INTRAVENOUS | Status: DC | PRN

## 2014-04-12 NOTE — H&P (Signed)
Katherine Marshall is a 28 y.o. female, G1P0 at 29 2/7 weeks, presenting for admission due to pre-eclampsia.  Seen in office today with elevated BP--sent to MAU for PIH w/u, with results showing elevated uric acid, mild thrombocytopenia, elevated PCR, and persistently elevated BPs.  Patient has noted visual changes this week and has " just not been feeling well".  Denies leaking, bleeding, HA, epigastric pain, contractions, and reports +FM.   Patient Active Problem List   Diagnosis Date Noted  . Preeclampsia 04/12/2014  . Positive GBS test 04/12/2014  . SGA (small for gestational age)--16%ile at 36 weeks 04/12/2014    History of present pregnancy: Patient entered care at 10 2/7 weeks.   EDC of 04/24/14 was established by [place] at 10 2/7 weeks.   Anatomy scan:  18 2/7 weeks, with normal findings and an posterior placenta, limited cardiac views.  Questionable marginal cord insertion. Additional [place] evaluations:   24 2/7 weeks: EFW 1+7, 24.7%ile, ? Marginal cord insertion. Limited cardiac views. 27 5/7 weeks:  EFW 2+9, 45%ile, normal fluid, vtx, with completion of cardiac views. 34 3/7 weeks:  EFW 5+7, 16%ile, normal fluid, vtx, BPP 8/8 Significant prenatal events:  Uncertain dx of marginal insertion on anatomy US, but f/u USs did not show marginal insertion.  Had basal cell carcinoma dx 12/2013 on face, with f/u surgery on 02/23/14.  Baseline BP  102/60.  BPs since 34 weeks 110-120/70-80. Last evaluation:  04/12/14, 38 2/7 weeks, c/o dizziness, seeing spots, feeling "off".  Sent to MAU for PIH evaluation and PCR.  OB History   Grav Para Term Preterm Abortions TAB SAB Ect Mult Living   1 0 0 0 0 0 0 0 0 0      Past Medical History  Diagnosis Date  . IBS (irritable bowel syndrome)   . Migraines     as a child  . Kidney stones    History reviewed. No pertinent past surgical history.  Family History: family history includes Cancer in her father, paternal grandfather, and paternal grandmother; Heart  attack in her maternal grandmother; Hypertension in her brother, father, maternal grandfather, maternal grandmother, mother, paternal grandfather, and paternal grandmother.  Social History:  reports that she has never smoked. She has never used smokeless tobacco. She reports that she drinks about 1.5 - 2 ounces of alcohol per week. She reports that she does not use illicit drugs.  Husband, Aaron Edelman, is involved and supportive.  Patient is employed as Freight forwarder and is college educated, of the [employer], and Caucasian.   Prenatal Transfer Tool  Maternal Diabetes: No Genetic Screening: Normal 1st trimester screen and AFP Maternal Ultrasounds/Referrals: Normal, but EFW 16%ile at 34 weeks Fetal Ultrasounds or other Referrals:  None Maternal Substance Abuse:  No Significant Maternal Medications:  Meds include: Other: Magnesium during labor Significant Maternal Lab Results: Lab values include: Group B Strep positive    ROS:  Mild visual sx on admission, +FM  No Known Allergies     Blood pressure 136/90, pulse 81, temperature 98.5 F (36.9 C), temperature source Oral, resp. rate 20, height 5' (1.524 m), weight 212 lb 12.8 oz (96.525 kg), last menstrual period 07/17/2013, SpO2 98.00%.  Filed Vitals:   04/12/14 1731 04/12/14 1746 04/12/14 1801 04/12/14 1816  BP: 161/94 145/94 139/98 136/90  Pulse: 63 56 70 81  Temp:      TempSrc:      Resp:      Height:      Weight:  SpO2:      BP range in MAU 130-161/90-98   Chest clear Heart RRR without murmur Abd gravid, NT, FH 37 cm Pelvic: Closed, 50%, vtx, -2, cervix soft Ext: DTR 2+ without clonus, 1-2+ edema  FHR: Category 1 UCs:  None  Prenatal labs: ABO, Rh: A/Positive/-- (11/25 0000) Antibody: Negative (11/25 0000) Rubella:   Immune RPR: Nonreactive (11/25 0000)  HBsAg: Negative (11/25 0000)  HIV: Non-reactive (11/25 0000)  GBS: Positive (06/10 0000) Sickle cell/Hgb electrophoresis:  NA Pap:  Unknown GC:  Negative  09/13/13 Chlamydia: Negative 09/13/13 Genetic screenings:  Normal 1st trimester screen and AFP Glucola:  WNL Other:  Hgb 13.5 at NOB, 12.3 11/2013, 11.4 at 28 weeks. TSH WNL on 11/23/13, Vit D WNL at 11/23/13.       Assessment/Plan: IUP at 38 2/7 weeks Pre-eclampsia GBS positive SGA fetus  Plan: Admit to [facility] per consult with Dr. Mancel Bale Routine [facility] orders Magnesium sulfate per protocol Labetalol IV prn for BP parameters GBS Rx with PCN per standard dosing PIH labs with mag level q 6 hours--next draw at 2330 Cytotech for cervical ripening, then pitocin per protocol Epidural prn. Reviewed dx of pre-eclampsia with patient and husband, including need for close monitoring of maternal/fetal status, magnesium sulfate during labor and for 24 hours pp. Will allow to eat tonight.  Allena Katz, [place] 04/12/2014, 8:58 PM

## 2014-04-12 NOTE — Progress Notes (Signed)
  Subjective: Now on Birthing Suite--denies HA, visual sx, or epigastric pain.  Objective: BP 122/88  Pulse 74  Temp(Src) 98.5 F (36.9 C) (Oral)  Resp 18  Ht 5' (1.524 m)  Wt 212 lb 12.8 oz (96.525 kg)  BMI 41.56 kg/m2  SpO2 98%  LMP 07/17/2013    Magnesium bolus infusing  FHT: Category 1 UC:   Very occasional SVE:   Dilation: Closed Effacement (%): 50 Station: -2 Exam by:: Donnel Saxon CNM Cytotech 25 mcg placed in posterior fornix--1st dose  Assessment:  Induction for pre-eclampsia GBS positive Unfavorable cervix SGA infant  Plan: Continue cytotech q 4 hours prn.  Donnel Saxon CNM 04/12/2014, 9:28 PM

## 2014-04-12 NOTE — MAU Note (Signed)
Pt sent over from the office for evaluation of elevated BP.

## 2014-04-12 NOTE — MAU Note (Signed)
Pt sent from MD office for elevated BP, has gradually been increasing over the last 2 weeks.  Has been having mild HA's, seeing spots @ times.  Had some uc's last night, mild cramps today.  Closed in office today, denies bleeding or LOF.

## 2014-04-13 ENCOUNTER — Inpatient Hospital Stay (HOSPITAL_COMMUNITY): Payer: BC Managed Care – PPO | Admitting: Anesthesiology

## 2014-04-13 ENCOUNTER — Encounter (HOSPITAL_COMMUNITY): Payer: BC Managed Care – PPO | Admitting: Anesthesiology

## 2014-04-13 DIAGNOSIS — K589 Irritable bowel syndrome without diarrhea: Secondary | ICD-10-CM | POA: Diagnosis not present

## 2014-04-13 DIAGNOSIS — Z87442 Personal history of urinary calculi: Secondary | ICD-10-CM | POA: Diagnosis not present

## 2014-04-13 DIAGNOSIS — C4491 Basal cell carcinoma of skin, unspecified: Secondary | ICD-10-CM | POA: Diagnosis not present

## 2014-04-13 LAB — COMPREHENSIVE METABOLIC PANEL
ALBUMIN: 2.5 g/dL — AB (ref 3.5–5.2)
ALBUMIN: 2.7 g/dL — AB (ref 3.5–5.2)
ALT: 11 U/L (ref 0–35)
ALT: 13 U/L (ref 0–35)
AST: 16 U/L (ref 0–37)
AST: 19 U/L (ref 0–37)
Alkaline Phosphatase: 141 U/L — ABNORMAL HIGH (ref 39–117)
Alkaline Phosphatase: 154 U/L — ABNORMAL HIGH (ref 39–117)
BUN: 13 mg/dL (ref 6–23)
BUN: 12 mg/dL (ref 6–23)
CO2: 21 mEq/L (ref 19–32)
CO2: 21 mEq/L (ref 19–32)
CREATININE: 1.08 mg/dL (ref 0.50–1.10)
Calcium: 8.2 mg/dL — ABNORMAL LOW (ref 8.4–10.5)
Calcium: 8.3 mg/dL — ABNORMAL LOW (ref 8.4–10.5)
Chloride: 100 mEq/L (ref 96–112)
Chloride: 103 mEq/L (ref 96–112)
Creatinine, Ser: 0.96 mg/dL (ref 0.50–1.10)
GFR calc Af Amer: 80 mL/min — ABNORMAL LOW (ref >90)
GFR calc Af Amer: >90 mL/min (ref >90)
GFR calc non Af Amer: 69 mL/min — ABNORMAL LOW (ref >90)
GFR calc non Af Amer: 79 mL/min — ABNORMAL LOW (ref >90)
Glucose, Bld: 96 mg/dL (ref 70–99)
Glucose, Bld: 90 mg/dL (ref 70–99)
POTASSIUM: 4.1 meq/L (ref 3.7–5.3)
Potassium: 4.2 mEq/L (ref 3.7–5.3)
SODIUM: 136 meq/L — AB (ref 137–147)
Sodium: 133 mEq/L — ABNORMAL LOW (ref 137–147)
TOTAL PROTEIN: 6.2 g/dL (ref 6.0–8.3)
TOTAL PROTEIN: 6.9 g/dL (ref 6.0–8.3)
Total Bilirubin: <0.2 mg/dL — ABNORMAL LOW (ref 0.3–1.2)
Total Bilirubin: <0.2 mg/dL — ABNORMAL LOW (ref 0.3–1.2)

## 2014-04-13 LAB — CBC
HCT: 34.4 % — ABNORMAL LOW (ref 36.0–46.0)
HCT: 36.9 % (ref 36.0–46.0)
HEMATOCRIT: 39.6 % (ref 36.0–46.0)
Hemoglobin: 11.8 g/dL — ABNORMAL LOW (ref 12.0–15.0)
Hemoglobin: 12.6 g/dL (ref 12.0–15.0)
Hemoglobin: 13.4 g/dL (ref 12.0–15.0)
MCH: 30.1 pg (ref 26.0–34.0)
MCH: 30.1 pg (ref 26.0–34.0)
MCH: 29.9 pg (ref 26.0–34.0)
MCHC: 34.3 g/dL (ref 30.0–36.0)
MCHC: 34.1 g/dL (ref 30.0–36.0)
MCHC: 33.8 g/dL (ref 30.0–36.0)
MCV: 87.8 fL (ref 78.0–100.0)
MCV: 88.1 fL (ref 78.0–100.0)
MCV: 88.4 fL (ref 78.0–100.0)
PLATELETS: 110 10*3/uL — AB (ref 150–400)
PLATELETS: 121 10*3/uL — AB (ref 150–400)
Platelets: 122 10*3/uL — ABNORMAL LOW (ref 150–400)
RBC: 3.92 MIL/uL (ref 3.87–5.11)
RBC: 4.19 MIL/uL (ref 3.87–5.11)
RBC: 4.48 MIL/uL (ref 3.87–5.11)
RDW: 13.1 % (ref 11.5–15.5)
RDW: 13 % (ref 11.5–15.5)
RDW: 13.2 % (ref 11.5–15.5)
WBC: 10.8 10*3/uL — AB (ref 4.0–10.5)
WBC: 11.3 10*3/uL — ABNORMAL HIGH (ref 4.0–10.5)
WBC: 12 10*3/uL — ABNORMAL HIGH (ref 4.0–10.5)

## 2014-04-13 LAB — TYPE AND SCREEN
ABO/RH(D): A POS
ANTIBODY SCREEN: NEGATIVE

## 2014-04-13 LAB — PROTEIN / CREATININE RATIO, URINE
CREATININE, URINE: 14.73 mg/dL
Total Protein, Urine: <4 mg/dL

## 2014-04-13 LAB — RPR

## 2014-04-13 LAB — URIC ACID
URIC ACID, SERUM: 8.9 mg/dL — AB (ref 2.4–7.0)
URIC ACID, SERUM: 8.9 mg/dL — AB (ref 2.4–7.0)

## 2014-04-13 LAB — LACTATE DEHYDROGENASE
LDH: 214 U/L (ref 94–250)
LDH: 232 U/L (ref 94–250)

## 2014-04-13 LAB — MAGNESIUM: Magnesium: 3.9 mg/dL — ABNORMAL HIGH (ref 1.5–2.5)

## 2014-04-13 LAB — ABO/RH: ABO/RH(D): A POS

## 2014-04-13 MED ORDER — DIPHENHYDRAMINE HCL 50 MG/ML IJ SOLN: 12.5000 mg | INTRAMUSCULAR | Status: DC | PRN

## 2014-04-13 MED ORDER — PHENYLEPHRINE 40 MCG/ML (10ML) SYRINGE FOR IV PUSH (FOR BLOOD PRESSURE SUPPORT)
80.0000 ug | PREFILLED_SYRINGE | INTRAVENOUS | Status: DC | PRN
  Filled 2014-04-13: qty 2

## 2014-04-13 MED ORDER — EPHEDRINE 5 MG/ML INJ
10.0000 mg | INTRAVENOUS | Status: DC | PRN
  Filled 2014-04-13: qty 2

## 2014-04-13 MED ORDER — BUPIVACAINE HCL (PF) 0.25 % IJ SOLN
INTRAMUSCULAR | Status: DC | PRN
  Administered 2014-04-13: 4 mL via EPIDURAL
  Administered 2014-04-13: 4 mL

## 2014-04-13 MED ORDER — SODIUM BICARBONATE 8.4 % IV SOLN
INTRAVENOUS | Status: DC | PRN
  Administered 2014-04-13: 3 mL via EPIDURAL

## 2014-04-13 MED ORDER — FENTANYL 2.5 MCG/ML BUPIVACAINE 1/10 % EPIDURAL INFUSION (WH - ANES)
14.0000 mL/h | INTRAMUSCULAR | Status: DC | PRN
  Administered 2014-04-14: 14 mL/h via EPIDURAL
  Filled 2014-04-13 (×2): qty 125

## 2014-04-13 MED ORDER — FENTANYL 2.5 MCG/ML BUPIVACAINE 1/10 % EPIDURAL INFUSION (WH - ANES)
INTRAMUSCULAR | Status: DC | PRN
  Administered 2014-04-13: 14 mL/h via EPIDURAL

## 2014-04-13 MED ORDER — EPHEDRINE 5 MG/ML INJ
10.0000 mg | INTRAVENOUS | Status: DC | PRN
  Filled 2014-04-13: qty 2
  Filled 2014-04-13: qty 4

## 2014-04-13 MED ORDER — PHENYLEPHRINE 40 MCG/ML (10ML) SYRINGE FOR IV PUSH (FOR BLOOD PRESSURE SUPPORT)
80.0000 ug | PREFILLED_SYRINGE | INTRAVENOUS | Status: DC | PRN
  Filled 2014-04-13: qty 10
  Filled 2014-04-13: qty 2

## 2014-04-13 MED ORDER — FENTANYL CITRATE 0.05 MG/ML IJ SOLN
100.0000 ug | INTRAMUSCULAR | Status: DC | PRN
  Administered 2014-04-13 (×2): 100 ug via INTRAVENOUS
  Filled 2014-04-13 (×2): qty 2

## 2014-04-13 MED ORDER — TERBUTALINE SULFATE 1 MG/ML IJ SOLN: 0.2500 mg | Freq: Once | INTRAMUSCULAR | Status: AC | PRN

## 2014-04-13 MED ORDER — LACTATED RINGERS IV SOLN
500.0000 mL | Freq: Once | INTRAVENOUS | Status: DC
Start: 2014-04-13 — End: 2014-04-14

## 2014-04-13 NOTE — Progress Notes (Signed)
  Subjective: Pt uncomfortable with UCs, having to breath through them.  Family at bedside and supportive.  Pt asking for epidural.  Objective: BP 140/95  Pulse 51  Temp(Src) 98.5 F (36.9 C) (Oral)  Resp 18  Ht 5' (1.524 m)  Wt 212 lb (96.163 kg)  BMI 41.40 kg/m2  SpO2 98%  LMP 07/17/2013      FHT: Category I UC:   regular, every 5-6 minutes  SVE:   Deferred d/t SROM and foley bulb in place  Induction of labor due to preeclampsia,  4 doses of Cytotec placed throughout the day, foley bulb placed at 1745 with SROM of clear fluid.  Pt is currently contracting on her own, will monitor progress and start pitocin as needed. Labor: Progressing normally  Preeclampsia: on magnesium sulfate, no signs or symptoms of toxicity, labs stable and BP at 1915 128/79  Fetal Wellbeing: Category I  Pain Control: Requesting epidural at this time  I/D: GBS pos; PCN; SROM at 1745; Afebrile  Anticipated MOD: NSVD    OXLEY, JENNIFER 01/21/2014, 7:23 AM

## 2014-04-13 NOTE — Progress Notes (Signed)
  Subjective: Pt is comfortable with epidural.  Family at bedside and supportive.  Pt voices that she is nervous - reassurances given.  Objective: BP 110/83  Pulse 57  Temp(Src) 98.5 F (36.9 C) (Oral)  Resp 18  Ht 5' (1.524 m)  Wt 212 lb (96.163 kg)  BMI 41.40 kg/m2  SpO2 98%  LMP 07/17/2013      FHT: Category I UC:   regular, every 5-6 minutes  SVE:   Dilation: 4.5 Effacement (%): 70 Station: -2 Exam by:: oxley, Anderson Malta CNM   Induction of labor due to preeclampsia,  Foley bulb noted in vagina, removed  Labor: Pitocin started  Preeclampsia: on magnesium sulfate, no signs or symptoms of toxicity and BPs 140/95 and 110/83  Fetal Wellbeing: Category I  Pain Control: Epidural  I/D: GBS pos; PCN; SROM at 1745; Afebrile    Anticipated MOD: NSVD    OXLEY, JENNIFER 01/21/2014, 7:23 AM

## 2014-04-13 NOTE — Progress Notes (Addendum)
  Subjective: Received IV medication just prior to foley bulb placement. Denies s/s of pre-e.  Objective: BP 157/93  Pulse 60  Temp(Src) 98.4 F (36.9 C) (Oral)  Resp 20  Ht 5' (1.524 m)  Wt 212 lb (96.163 kg)  BMI 41.40 kg/m2  SpO2 98%  LMP 07/17/2013 I/O last 3 completed shifts: In: 4166.3 [P.O.:1773; I.V.:2043.3; IV Piggyback:350] Out: 1400 [XYBFX:8329] Total I/O In: 1916 [P.O.:1740; I.V.:1500; IV Piggyback:300] Out: 2550 [Urine:2450; Emesis/NG output:100]  FHT: Category 2 (min variability due to recent Fentanyl IV) UC:   irregular, every 3-5 minutes SVE:   Dilation: 1 Effacement (%): 50 Station: -2 Exam by:: williams, k cnm On magnesium sulfate, no signs or symptoms of toxicity, intake and ouput balanced and BP stable.  Foley balloon inserted and inflated w 60cc LR, taped to leg w mild traction. SROM, clear fluid while placing foley. Cephalic by Leopolds confirmed by limited u/s.  Assessment:  IOL for pre-e  Plan: Continue w/ current management plan. Consult prn. Pre-e labs in a.m. Report to oncoming CNM.  Farrel Gordon CNM 04/13/2014, 6:19 PM

## 2014-04-13 NOTE — Progress Notes (Signed)
  Subjective: Feeling some contractions, but comfortable.  Has dosed at intervals. Dry cath placed due to platelets 110 at 2015--cath placed at   Objective: BP 120/95  Pulse 85  Temp(Src) 99.2 F (37.3 C) (Axillary)  Resp 18  Ht 5' (1.524 m)  Wt 212 lb (96.163 kg)  BMI 41.40 kg/m2  SpO2 98%  LMP 07/17/2013   Total I/O In: 3075.3 [P.O.:1182; I.V.:1543.3; IV Piggyback:350] Out: 850 [Urine:850]  Filed Vitals:   04/13/14 0401  BP: 120/95  Pulse: 85  Temp:   Resp:    CBC Latest Ref Rng 04/12/2014 04/12/2014 04/12/2014  WBC 4.0 - 10.5 K/uL 10.8(H) 12.6(H) 9.7  Hemoglobin 12.0 - 15.0 g/dL 11.8(L) 12.9 12.4  Hematocrit 36.0 - 46.0 % 34.4(L) 37.3 36.1  Platelets 150 - 400 K/uL 110(L) 130(L) 117(L)   Magnesium started at 2143 Magnesium level 3.9 at 2235  Initial PCN dose for GBS prophylaxis at 2255.  FHT: Category 1 UC:   Irregular, mild SVE:   Dilation: Closed Effacement (%): 50 Station: -2 Exam by:: Donnel Saxon CNM Cytotech 2nd dose placed at 4am  Assessment:  Induction for pre-eclampsia GBS positive  Plan: Continue magnesium sulfate Continue GBS prophylaxis Re-evaluate cervix at 8am for next plan of care (repeat Cytotech, foley bulb, pitocin)  LATHAM, VICKI CNM 04/13/2014, 4:17 AM

## 2014-04-13 NOTE — Progress Notes (Addendum)
  Subjective: Feeling ctxs more. Stadol made her too loopy, but helped some with pain.  Objective: BP 157/93  Pulse 60  Temp(Src) 98.4 F (36.9 C) (Oral)  Resp 20  Ht 5' (1.524 m)  Wt 212 lb (96.163 kg)  BMI 41.40 kg/m2  SpO2 98%  LMP 07/17/2013 I/O last 3 completed shifts: In: 4166.3 [P.O.:1773; I.V.:2043.3; IV Piggyback:350] Out: 1400 [QKMMN:8177] Total I/O In: 1165 [P.O.:1740; I.V.:1375; IV Piggyback:300] Out: 2350 [Urine:2350]  Vomited 100 ml of dark emesis  FHT: Category 1 UC:   irregular, every 2-5 minutes SVE:   Dilation: 1 Effacement (%): 50 Station: -2 Exam by:: williams, k cnm   Assessment:  Latent labor GBS positive  Plan: Reviewed R&B of foley bulb induction with patient and husband. Patient and husband seem to understand and agreed with proceeding. Fentanyl IV prior to foley bulb. Consult prn.  Farrel Gordon CNM 04/13/2014, 5:31 PM

## 2014-04-13 NOTE — Anesthesia Preprocedure Evaluation (Signed)
Anesthesia Evaluation  Patient identified by MRN, date of birth, ID band Patient awake    Reviewed: Allergy & Precautions, H&P , NPO status , Patient's Chart, lab work & pertinent test results  History of Anesthesia Complications Negative for: history of anesthetic complications  Airway Mallampati: II TM Distance: >3 FB Neck ROM: Full    Dental  (+) Teeth Intact   Pulmonary neg pulmonary ROS,          Cardiovascular hypertension, Rhythm:Regular     Neuro/Psych  Headaches, negative psych ROS   GI/Hepatic Neg liver ROS, GERD-  Medicated and Controlled,  Endo/Other    Renal/GU negative Renal ROS     Musculoskeletal   Abdominal   Peds  Hematology  (+) anemia ,   Anesthesia Other Findings   Reproductive/Obstetrics                           Anesthesia Physical Anesthesia Plan  ASA: III  Anesthesia Plan: Epidural   Post-op Pain Management:    Induction:   Airway Management Planned:   Additional Equipment:   Intra-op Plan:   Post-operative Plan:   Informed Consent: I have reviewed the patients History and Physical, chart, labs and discussed the procedure including the risks, benefits and alternatives for the proposed anesthesia with the patient or authorized representative who has indicated his/her understanding and acceptance.   Dental advisory given  Plan Discussed with: Anesthesiologist  Anesthesia Plan Comments:         Anesthesia Quick Evaluation

## 2014-04-13 NOTE — Progress Notes (Signed)
Subjective: Starting to feel her ctxs, but states bearable. Family at bedside. Still has slight headache but denies visual disturbances, RUQ pain, CP, SOB or N/V. +FM, no VB or LOF.  Objective: BP 145/89  Pulse 68  Temp(Src) 98.4 F (36.9 C) (Oral)  Resp 20  Ht 5' (1.524 m)  Wt 212 lb (96.163 kg)  BMI 41.40 kg/m2  SpO2 98%  LMP 07/17/2013 I/O last 3 completed shifts: In: 4166.3 [P.O.:1773; I.V.:2043.3; IV Piggyback:350] Out: 1400 [Urine:1400] Total I/O In: 2332.9 [P.O.:1260; I.V.:872.9; IV Piggyback:200] Out: 1800 [Urine:1800]  Results for orders placed during the hospital encounter of 04/12/14 (from the past 24 hour(s))  PROTEIN / CREATININE RATIO, URINE     Status: Abnormal   Collection Time    04/12/14  4:37 PM      Result Value Ref Range   Creatinine, Urine 140.74     Total Protein, Urine 71.5     PROTEIN CREATININE RATIO 0.51 (*) 0.00 - 0.15  URINALYSIS, ROUTINE W REFLEX MICROSCOPIC     Status: Abnormal   Collection Time    04/12/14  4:47 PM      Result Value Ref Range   Color, Urine YELLOW  YELLOW   APPearance CLOUDY (*) CLEAR   Specific Gravity, Urine 1.025  1.005 - 1.030   pH 6.0  5.0 - 8.0   Glucose, UA NEGATIVE  NEGATIVE mg/dL   Hgb urine dipstick TRACE (*) NEGATIVE   Bilirubin Urine NEGATIVE  NEGATIVE   Ketones, ur 15 (*) NEGATIVE mg/dL   Protein, ur 100 (*) NEGATIVE mg/dL   Urobilinogen, UA 0.2  0.0 - 1.0 mg/dL   Nitrite NEGATIVE  NEGATIVE   Leukocytes, UA SMALL (*) NEGATIVE  URINE MICROSCOPIC-ADD ON     Status: Abnormal   Collection Time    04/12/14  4:47 PM      Result Value Ref Range   Squamous Epithelial / LPF MANY (*) RARE   WBC, UA 3-6  <3 WBC/hpf   Bacteria, UA MANY (*) RARE  CBC     Status: Abnormal   Collection Time    04/12/14  5:25 PM      Result Value Ref Range   WBC 9.7  4.0 - 10.5 K/uL   RBC 4.11  3.87 - 5.11 MIL/uL   Hemoglobin 12.4  12.0 - 15.0 g/dL   HCT 36.1  36.0 - 46.0 %   MCV 87.8  78.0 - 100.0 fL   MCH 30.2  26.0 - 34.0  pg   MCHC 34.3  30.0 - 36.0 g/dL   RDW 13.0  11.5 - 15.5 %   Platelets 117 (*) 150 - 400 K/uL  COMPREHENSIVE METABOLIC PANEL     Status: Abnormal   Collection Time    04/12/14  5:25 PM      Result Value Ref Range   Sodium 132 (*) 137 - 147 mEq/L   Potassium 4.0  3.7 - 5.3 mEq/L   Chloride 101  96 - 112 mEq/L   CO2 18 (*) 19 - 32 mEq/L   Glucose, Bld 85  70 - 99 mg/dL   BUN 14  6 - 23 mg/dL   Creatinine, Ser 0.93  0.50 - 1.10 mg/dL   Calcium 9.4  8.4 - 10.5 mg/dL   Total Protein 6.6  6.0 - 8.3 g/dL   Albumin 2.6 (*) 3.5 - 5.2 g/dL   AST 17  0 - 37 U/L   ALT 11  0 - 35 U/L   Alkaline  Phosphatase 133 (*) 39 - 117 U/L   Total Bilirubin <0.2 (*) 0.3 - 1.2 mg/dL   GFR calc non Af Amer 82 (*) >90 mL/min   GFR calc Af Amer >90  >90 mL/min  LACTATE DEHYDROGENASE     Status: None   Collection Time    04/12/14  5:25 PM      Result Value Ref Range   LDH 211  94 - 250 U/L  URIC ACID     Status: Abnormal   Collection Time    04/12/14  5:25 PM      Result Value Ref Range   Uric Acid, Serum 9.0 (*) 2.4 - 7.0 mg/dL  RPR     Status: None   Collection Time    04/12/14  9:14 PM      Result Value Ref Range   RPR NON REAC  NON REAC  CBC     Status: Abnormal   Collection Time    04/12/14  9:14 PM      Result Value Ref Range   WBC 12.6 (*) 4.0 - 10.5 K/uL   RBC 4.26  3.87 - 5.11 MIL/uL   Hemoglobin 12.9  12.0 - 15.0 g/dL   HCT 37.3  36.0 - 46.0 %   MCV 87.6  78.0 - 100.0 fL   MCH 30.3  26.0 - 34.0 pg   MCHC 34.6  30.0 - 36.0 g/dL   RDW 13.2  11.5 - 15.5 %   Platelets 130 (*) 150 - 400 K/uL  COMPREHENSIVE METABOLIC PANEL     Status: Abnormal   Collection Time    04/12/14  9:14 PM      Result Value Ref Range   Sodium 133 (*) 137 - 147 mEq/L   Potassium 4.0  3.7 - 5.3 mEq/L   Chloride 100  96 - 112 mEq/L   CO2 18 (*) 19 - 32 mEq/L   Glucose, Bld 76  70 - 99 mg/dL   BUN 13  6 - 23 mg/dL   Creatinine, Ser 0.96  0.50 - 1.10 mg/dL   Calcium 9.3  8.4 - 10.5 mg/dL   Total Protein 6.6   6.0 - 8.3 g/dL   Albumin 2.7 (*) 3.5 - 5.2 g/dL   AST 17  0 - 37 U/L   ALT 12  0 - 35 U/L   Alkaline Phosphatase 144 (*) 39 - 117 U/L   Total Bilirubin 0.2 (*) 0.3 - 1.2 mg/dL   GFR calc non Af Amer 79 (*) >90 mL/min   GFR calc Af Amer >90  >90 mL/min  LACTATE DEHYDROGENASE     Status: None   Collection Time    04/12/14  9:14 PM      Result Value Ref Range   LDH 226  94 - 250 U/L  URIC ACID     Status: Abnormal   Collection Time    04/12/14  9:14 PM      Result Value Ref Range   Uric Acid, Serum 8.9 (*) 2.4 - 7.0 mg/dL  MAGNESIUM     Status: None   Collection Time    04/12/14  9:14 PM      Result Value Ref Range   Magnesium 1.7  1.5 - 2.5 mg/dL  TYPE AND SCREEN     Status: None   Collection Time    04/12/14  9:14 PM      Result Value Ref Range   ABO/RH(D) A POS     Antibody Screen  NEG     Sample Expiration 04/15/2014    ABO/RH     Status: None   Collection Time    04/12/14  9:14 PM      Result Value Ref Range   ABO/RH(D) A POS    CBC     Status: Abnormal   Collection Time    04/12/14 10:35 PM      Result Value Ref Range   WBC 10.8 (*) 4.0 - 10.5 K/uL   RBC 3.92  3.87 - 5.11 MIL/uL   Hemoglobin 11.8 (*) 12.0 - 15.0 g/dL   HCT 34.4 (*) 36.0 - 46.0 %   MCV 87.8  78.0 - 100.0 fL   MCH 30.1  26.0 - 34.0 pg   MCHC 34.3  30.0 - 36.0 g/dL   RDW 13.1  11.5 - 15.5 %   Platelets 110 (*) 150 - 400 K/uL  MAGNESIUM     Status: Abnormal   Collection Time    04/12/14 10:35 PM      Result Value Ref Range   Magnesium 3.9 (*) 1.5 - 2.5 mg/dL  CBC     Status: Abnormal   Collection Time    04/13/14  5:25 AM      Result Value Ref Range   WBC 11.3 (*) 4.0 - 10.5 K/uL   RBC 4.19  3.87 - 5.11 MIL/uL   Hemoglobin 12.6  12.0 - 15.0 g/dL   HCT 36.9  36.0 - 46.0 %   MCV 88.1  78.0 - 100.0 fL   MCH 30.1  26.0 - 34.0 pg   MCHC 34.1  30.0 - 36.0 g/dL   RDW 13.0  11.5 - 15.5 %   Platelets 121 (*) 150 - 400 K/uL  COMPREHENSIVE METABOLIC PANEL     Status: Abnormal   Collection Time     04/13/14  5:25 AM      Result Value Ref Range   Sodium 133 (*) 137 - 147 mEq/L   Potassium 4.1  3.7 - 5.3 mEq/L   Chloride 100  96 - 112 mEq/L   CO2 21  19 - 32 mEq/L   Glucose, Bld 96  70 - 99 mg/dL   BUN 13  6 - 23 mg/dL   Creatinine, Ser 1.08  0.50 - 1.10 mg/dL   Calcium 8.2 (*) 8.4 - 10.5 mg/dL   Total Protein 6.2  6.0 - 8.3 g/dL   Albumin 2.5 (*) 3.5 - 5.2 g/dL   AST 16  0 - 37 U/L   ALT 11  0 - 35 U/L   Alkaline Phosphatase 141 (*) 39 - 117 U/L   Total Bilirubin <0.2 (*) 0.3 - 1.2 mg/dL   GFR calc non Af Amer 69 (*) >90 mL/min   GFR calc Af Amer 80 (*) >90 mL/min  LACTATE DEHYDROGENASE     Status: None   Collection Time    04/13/14  5:25 AM      Result Value Ref Range   LDH 214  94 - 250 U/L  URIC ACID     Status: Abnormal   Collection Time    04/13/14  5:25 AM      Result Value Ref Range   Uric Acid, Serum 8.9 (*) 2.4 - 7.0 mg/dL  PROTEIN / CREATININE RATIO, URINE     Status: None   Collection Time    04/13/14 11:00 AM      Result Value Ref Range   Creatinine, Urine 14.73     Total  Protein, Urine <4     PROTEIN CREATININE RATIO         0.00 - 0.15  CBC     Status: Abnormal   Collection Time    04/13/14 11:10 AM      Result Value Ref Range   WBC 12.0 (*) 4.0 - 10.5 K/uL   RBC 4.48  3.87 - 5.11 MIL/uL   Hemoglobin 13.4  12.0 - 15.0 g/dL   HCT 39.6  36.0 - 46.0 %   MCV 88.4  78.0 - 100.0 fL   MCH 29.9  26.0 - 34.0 pg   MCHC 33.8  30.0 - 36.0 g/dL   RDW 13.2  11.5 - 15.5 %   Platelets 122 (*) 150 - 400 K/uL  LACTATE DEHYDROGENASE     Status: None   Collection Time    04/13/14 11:10 AM      Result Value Ref Range   LDH 232  94 - 250 U/L  COMPREHENSIVE METABOLIC PANEL     Status: Abnormal   Collection Time    04/13/14 11:10 AM      Result Value Ref Range   Sodium 136 (*) 137 - 147 mEq/L   Potassium 4.2  3.7 - 5.3 mEq/L   Chloride 103  96 - 112 mEq/L   CO2 21  19 - 32 mEq/L   Glucose, Bld 90  70 - 99 mg/dL   BUN 12  6 - 23 mg/dL   Creatinine, Ser 0.96   0.50 - 1.10 mg/dL   Calcium 8.3 (*) 8.4 - 10.5 mg/dL   Total Protein 6.9  6.0 - 8.3 g/dL   Albumin 2.7 (*) 3.5 - 5.2 g/dL   AST 19  0 - 37 U/L   ALT 13  0 - 35 U/L   Alkaline Phosphatase 154 (*) 39 - 117 U/L   Total Bilirubin <0.2 (*) 0.3 - 1.2 mg/dL   GFR calc non Af Amer 79 (*) >90 mL/min   GFR calc Af Amer >90  >90 mL/min  URIC ACID     Status: Abnormal   Collection Time    04/13/14 11:10 AM      Result Value Ref Range   Uric Acid, Serum 8.9 (*) 2.4 - 7.0 mg/dL   PCR pending  FHT: Category 1 UC:   irregular, every 1-5 minutes SVE:   Dilation: Closed Effacement (%): 50 Station: -2 Exam by:: Melysa Schroyer, cnm  Cytotec #4 placed @ 1303  Assessment:  IOL for pre-e GBS positive Plan: D/C q 6 hr labs per Dr. Charlesetta Garibaldi; repeat in a.m.   Farrel Gordon CNM 04/13/2014, 1:20 PM

## 2014-04-13 NOTE — Progress Notes (Addendum)
  Subjective: Assumed care. Reports slight headache and active fetus. Denies visual disturbances, RUQ pain, N/V, CP, SOB, LOF, ctxs or VB.  Objective: BP 151/98  Pulse 62  Temp(Src) 98.2 F (36.8 C) (Oral)  Resp 18  Ht 5' (1.524 m)  Wt 212 lb (96.163 kg)  BMI 41.40 kg/m2  SpO2 98%  LMP 07/17/2013 I/O last 3 completed shifts: In: 4166.3 [P.O.:1773; I.V.:2043.3; IV Piggyback:350] Out: 1400 [Urine:1400] Total I/O In: 266.7 [I.V.:266.7] Out: 500 [Urine:500]  Gen: NAD Lungs: CTAB CV: RRR Abd: gravid, soft, NT Cephalic by Leopolds Ext: 3+ DTRs bilaterally, no clonus, trace edema FHT: Category 1 UC:   irregular, every 1-6 minutes SVE:   Dilation: Closed Effacement (%): 50 Station: -2 Exam by:: williams, cnm  Has had 2 doses of Cytotec. #3 placed at [place].  Assessment:  Pre-e GBS positive  Plan: Light breakfast Next set of pre-e labs at 1100 Continue Magnesium Sulfate Labetalol IV prn Re-evaluate in 4 hrs, sooner if indicated Consult prn  Farrel Gordon CNM 04/13/2014, 8:29 AM

## 2014-04-13 NOTE — Anesthesia Procedure Notes (Signed)
Epidural Patient location during procedure: OB Start time: 04/13/2014 2:49 AM End time: 04/13/2014 3:05 AM  Staffing Anesthesiologist: MOSER, CHRIS Performed by: anesthesiologist   Preanesthetic Checklist Completed: patient identified, surgical consent, pre-op evaluation, timeout performed, IV checked, risks and benefits discussed and monitors and equipment checked  Epidural Patient position: sitting Prep: site prepped and draped and DuraPrep Patient monitoring: heart rate, cardiac monitor, continuous pulse ox and blood pressure Approach: midline Location: L3-L4 Injection technique: LOR saline  Needle:  Needle type: Tuohy  Needle gauge: 17 G Needle length: 9 cm Needle insertion depth: 7 cm Catheter type: closed end flexible Catheter size: 19 Gauge Catheter at skin depth: 12 cm Test dose: 2% lidocaine with Epi 1:200 K  Assessment Events: blood not aspirated, injection not painful, no injection resistance, negative IV test and no paresthesia  Additional Notes H+P and labs checked, risks and benefits discussed with the patient, consent obtained, procedure tolerated well and without complications.  Reason for block:procedure for pain

## 2014-04-14 ENCOUNTER — Encounter (HOSPITAL_COMMUNITY): Payer: Self-pay | Admitting: *Deleted

## 2014-04-14 DIAGNOSIS — O1490 Unspecified pre-eclampsia, unspecified trimester: Secondary | ICD-10-CM

## 2014-04-14 LAB — COMPREHENSIVE METABOLIC PANEL
ALT: 12 U/L (ref 0–35)
AST: 16 U/L (ref 0–37)
Albumin: 2.4 g/dL — ABNORMAL LOW (ref 3.5–5.2)
Alkaline Phosphatase: 139 U/L — ABNORMAL HIGH (ref 39–117)
BUN: 11 mg/dL (ref 6–23)
CALCIUM: 6.9 mg/dL — AB (ref 8.4–10.5)
CO2: 21 mEq/L (ref 19–32)
Chloride: 98 mEq/L (ref 96–112)
Creatinine, Ser: 1.11 mg/dL — ABNORMAL HIGH (ref 0.50–1.10)
GFR calc Af Amer: 77 mL/min — ABNORMAL LOW (ref >90)
GFR calc non Af Amer: 66 mL/min — ABNORMAL LOW (ref >90)
Glucose, Bld: 127 mg/dL — ABNORMAL HIGH (ref 70–99)
Potassium: 4.1 mEq/L (ref 3.7–5.3)
SODIUM: 134 meq/L — AB (ref 137–147)
Total Bilirubin: 0.3 mg/dL (ref 0.3–1.2)
Total Protein: 6.6 g/dL (ref 6.0–8.3)

## 2014-04-14 LAB — CBC
HCT: 37.5 % (ref 36.0–46.0)
HEMATOCRIT: 34.2 % — AB (ref 36.0–46.0)
HEMOGLOBIN: 11.6 g/dL — AB (ref 12.0–15.0)
Hemoglobin: 12.5 g/dL (ref 12.0–15.0)
MCH: 29.8 pg (ref 26.0–34.0)
MCH: 29.9 pg (ref 26.0–34.0)
MCHC: 33.3 g/dL (ref 30.0–36.0)
MCHC: 33.9 g/dL (ref 30.0–36.0)
MCV: 89.5 fL (ref 78.0–100.0)
MCV: 88.1 fL (ref 78.0–100.0)
PLATELETS: 122 10*3/uL — AB (ref 150–400)
Platelets: 117 10*3/uL — ABNORMAL LOW (ref 150–400)
RBC: 4.19 MIL/uL (ref 3.87–5.11)
RBC: 3.88 MIL/uL (ref 3.87–5.11)
RDW: 13.3 % (ref 11.5–15.5)
RDW: 13.2 % (ref 11.5–15.5)
WBC: 16.2 10*3/uL — ABNORMAL HIGH (ref 4.0–10.5)
WBC: 23.6 10*3/uL — ABNORMAL HIGH (ref 4.0–10.5)

## 2014-04-14 LAB — MAGNESIUM
MAGNESIUM: 6 mg/dL — AB (ref 1.5–2.5)
Magnesium: 7.7 mg/dL (ref 1.5–2.5)

## 2014-04-14 LAB — MRSA PCR SCREENING: MRSA by PCR: NEGATIVE

## 2014-04-14 LAB — URIC ACID: Uric Acid, Serum: 9.5 mg/dL — ABNORMAL HIGH (ref 2.4–7.0)

## 2014-04-14 LAB — LACTATE DEHYDROGENASE: LDH: 215 U/L (ref 94–250)

## 2014-04-14 MED ORDER — IBUPROFEN 600 MG PO TABS
600.0000 mg | ORAL_TABLET | Freq: Four times a day (QID) | ORAL | Status: DC
  Administered 2014-04-14 – 2014-04-16 (×8): 600 mg via ORAL
  Filled 2014-04-14 (×8): qty 1

## 2014-04-14 MED ORDER — SODIUM CHLORIDE 0.9 % IJ SOLN: 3.0000 mL | INTRAMUSCULAR | Status: DC | PRN

## 2014-04-14 MED ORDER — ONDANSETRON HCL 4 MG/2ML IJ SOLN: 4.0000 mg | INTRAMUSCULAR | Status: DC | PRN

## 2014-04-14 MED ORDER — SODIUM CHLORIDE 0.9 % IJ SOLN
3.0000 mL | Freq: Two times a day (BID) | INTRAMUSCULAR | Status: DC
  Administered 2014-04-15: 3 mL via INTRAVENOUS

## 2014-04-14 MED ORDER — TETANUS-DIPHTH-ACELL PERTUSSIS 5-2.5-18.5 LF-MCG/0.5 IM SUSP
0.5000 mL | Freq: Once | INTRAMUSCULAR | Status: DC
  Filled 2014-04-14: qty 0.5

## 2014-04-14 MED ORDER — DIPHENHYDRAMINE HCL 25 MG PO CAPS: 25.0000 mg | ORAL_CAPSULE | Freq: Four times a day (QID) | ORAL | Status: DC | PRN

## 2014-04-14 MED ORDER — SIMETHICONE 80 MG PO CHEW: 80.0000 mg | CHEWABLE_TABLET | ORAL | Status: DC | PRN

## 2014-04-14 MED ORDER — MEDROXYPROGESTERONE ACETATE 150 MG/ML IM SUSP: 150.0000 mg | INTRAMUSCULAR | Status: DC | PRN

## 2014-04-14 MED ORDER — ONDANSETRON HCL 4 MG PO TABS: 4.0000 mg | ORAL_TABLET | ORAL | Status: DC | PRN

## 2014-04-14 MED ORDER — ZOLPIDEM TARTRATE 5 MG PO TABS: 5.0000 mg | ORAL_TABLET | Freq: Every evening | ORAL | Status: DC | PRN

## 2014-04-14 MED ORDER — OXYCODONE-ACETAMINOPHEN 5-325 MG PO TABS: 1.0000 | ORAL_TABLET | ORAL | Status: DC | PRN

## 2014-04-14 MED ORDER — SENNOSIDES-DOCUSATE SODIUM 8.6-50 MG PO TABS
2.0000 | ORAL_TABLET | ORAL | Status: DC
  Administered 2014-04-14 – 2014-04-15 (×2): 2 via ORAL
  Filled 2014-04-14: qty 2

## 2014-04-14 MED ORDER — PRENATAL MULTIVITAMIN CH
1.0000 | ORAL_TABLET | Freq: Every day | ORAL | Status: DC
  Administered 2014-04-14 – 2014-04-16 (×3): 1 via ORAL
  Filled 2014-04-14 (×3): qty 1

## 2014-04-14 MED ORDER — WITCH HAZEL-GLYCERIN EX PADS: 1.0000 "application " | MEDICATED_PAD | CUTANEOUS | Status: DC | PRN

## 2014-04-14 MED ORDER — MAGNESIUM SULFATE 40 G IN LACTATED RINGERS - SIMPLE
1.0000 g/h | INTRAVENOUS | Status: AC
  Administered 2014-04-15: 1 g/h via INTRAVENOUS
  Filled 2014-04-14 (×2): qty 500

## 2014-04-14 MED ORDER — LANOLIN HYDROUS EX OINT: TOPICAL_OINTMENT | CUTANEOUS | Status: DC | PRN

## 2014-04-14 MED ORDER — LACTATED RINGERS IV SOLN
INTRAVENOUS | Status: AC
  Administered 2014-04-14 – 2014-04-15 (×3): via INTRAVENOUS

## 2014-04-14 MED ORDER — BENZOCAINE-MENTHOL 20-0.5 % EX AERO
1.0000 "application " | INHALATION_SPRAY | CUTANEOUS | Status: DC | PRN
  Administered 2014-04-14: 1 via TOPICAL
  Filled 2014-04-14: qty 56

## 2014-04-14 MED ORDER — DIBUCAINE 1 % RE OINT: 1.0000 "application " | TOPICAL_OINTMENT | RECTAL | Status: DC | PRN

## 2014-04-14 NOTE — Progress Notes (Addendum)
  Subjective: Pt is comfortable with epidural.  Family at bedside and supportive.  Objective: BP 125/82  Pulse 69  Temp(Src) 98.2 F (36.8 C) (Axillary)  Resp 18  Ht 5' (1.524 m)  Wt 212 lb (96.163 kg)  BMI 41.40 kg/m2  SpO2 98%  LMP 07/17/2013      FHT: Category II UC:   regular, every 4-5 minutes  SVE:   Dilation: Lip/rim Effacement (%): 100 Station: +1 Exam by:: Linda Hedges CNM   Induction of labor due to preeclampsia,  progressing well on pitocin  Labor: Progressing on Pitocin @  9 miliU Preeclampsia: on magnesium sulfate, no signs or symptoms of toxicity and labs drawn this am  Fetal Wellbeing: Category II  Pain Control: Epidural  I/D: GBS pos; PCN; ROM x12 hours; Afebrile     Anticipated MOD: NSVD    OXLEY, JENNIFER 01/21/2014, 7:23 AM

## 2014-04-14 NOTE — Lactation Note (Signed)
This note was copied from the chart of Katherine Marshall. Lactation Consultation Note  Patient Name: Katherine Marshall [id number] Date: 04/14/2014 Reason for consult: Follow-up assessment;Difficult latch but with persistence and brief assistance from RN, baby was able to latch for 20 minutes.  LC encouraged continued cue feedings (at least every 3 hours due to weight <6 lbs) and discussed with AICU RN, mom and FOB how breast support and intermittent breast compression may help keep baby in strong sucking rhythm.     Maternal Data    Feeding Feeding Type: Breast Fed Length of feed: 20 min  LATCH Score/Interventions                      Lactation Tools Discussed/Used   Breast support and compression throughout feeding Cue feedings  Consult Status Consult Status: Follow-up Date: 04/15/14 Follow-up type: In-patient    [name] [facility] 04/14/2014, 10:56 PM

## 2014-04-14 NOTE — Progress Notes (Addendum)
  Subjective: Pt is comfortable with epidural, sleeping Family at bedside and supportive.  Objective: BP 116/78  Pulse 61  Temp(Src) 98.4 F (36.9 C) (Oral)  Resp 18  Ht 5' (1.524 m)  Wt 212 lb (96.163 kg)  BMI 41.40 kg/m2  SpO2 98%  LMP 07/17/2013      FHT: Category I UC:   regular, every 5-6 minutes  SVE:   Deferred   Induction of labor due to preeclampsia,  progressing well on pitocin  Labor: Progressing on Pitocin, @ 4 miliU  Preeclampsia: on magnesium sulfate, no signs or symptoms of toxicity and BPs 106/51, 130/77, 116/78  Fetal Wellbeing: Category I  Pain Control: Epidural  I/D: GBS pos; PCN; SROM at 1745; Afebrile    Anticipated MOD: NSVD    OXLEY, JENNIFER 01/21/2014, 7:23 AM

## 2014-04-14 NOTE — Progress Notes (Signed)
Magn 7.7. Not symptomatic. Cut rate in half.  10/100/2+  FHR: Cat 1  Ready to push.  Dr. Raphael Gibney

## 2014-04-14 NOTE — Progress Notes (Signed)
Subjective: Received report.   Call received from RN in re: Magnesium level of 7.7.   Objective: BP 131/89  Pulse 73  Temp(Src) 98.7 F (37.1 C) (Oral)  Resp 20  Ht 5' (1.524 m)  Wt 212 lb (96.163 kg)  BMI 41.40 kg/m2  SpO2 98%  LMP 07/17/2013 I/O last 3 completed shifts: In: 10315.2 [P.O.:4053; I.V.:5212.2; IV Piggyback:1050] Out: 7209 [Urine:4525; Emesis/NG output:520]    FHT: Category 2 UC:   regular, every 3 minutes SVE:   Dilation: Lip/rim Effacement (%): 100 Station: +1 Exam by:: Linda Hedges CNM Pitocin at 10 mius/min Results for orders placed during the hospital encounter of 04/12/14 (from the past 24 hour(s))  PROTEIN / CREATININE RATIO, URINE     Status: None   Collection Time    04/13/14 11:00 AM      Result Value Ref Range   Creatinine, Urine 14.73     Total Protein, Urine <4     PROTEIN CREATININE RATIO         0.00 - 0.15  CBC     Status: Abnormal   Collection Time    04/13/14 11:10 AM      Result Value Ref Range   WBC 12.0 (*) 4.0 - 10.5 K/uL   RBC 4.48  3.87 - 5.11 MIL/uL   Hemoglobin 13.4  12.0 - 15.0 g/dL   HCT 39.6  36.0 - 46.0 %   MCV 88.4  78.0 - 100.0 fL   MCH 29.9  26.0 - 34.0 pg   MCHC 33.8  30.0 - 36.0 g/dL   RDW 13.2  11.5 - 15.5 %   Platelets 122 (*) 150 - 400 K/uL  LACTATE DEHYDROGENASE     Status: None   Collection Time    04/13/14 11:10 AM      Result Value Ref Range   LDH 232  94 - 250 U/L  COMPREHENSIVE METABOLIC PANEL     Status: Abnormal   Collection Time    04/13/14 11:10 AM      Result Value Ref Range   Sodium 136 (*) 137 - 147 mEq/L   Potassium 4.2  3.7 - 5.3 mEq/L   Chloride 103  96 - 112 mEq/L   CO2 21  19 - 32 mEq/L   Glucose, Bld 90  70 - 99 mg/dL   BUN 12  6 - 23 mg/dL   Creatinine, Ser 0.96  0.50 - 1.10 mg/dL   Calcium 8.3 (*) 8.4 - 10.5 mg/dL   Total Protein 6.9  6.0 - 8.3 g/dL   Albumin 2.7 (*) 3.5 - 5.2 g/dL   AST 19  0 - 37 U/L   ALT 13  0 - 35 U/L   Alkaline Phosphatase 154 (*) 39 - 117 U/L   Total Bilirubin <0.2 (*) 0.3 - 1.2 mg/dL   GFR calc non Af Amer 79 (*) >90 mL/min   GFR calc Af Amer >90  >90 mL/min  URIC ACID     Status: Abnormal   Collection Time    04/13/14 11:10 AM      Result Value Ref Range   Uric Acid, Serum 8.9 (*) 2.4 - 7.0 mg/dL  MAGNESIUM     Status: Abnormal   Collection Time    04/14/14  6:10 AM      Result Value Ref Range   Magnesium 7.7 (*) 1.5 - 2.5 mg/dL  CBC     Status: Abnormal   Collection Time    04/14/14  6:10 AM  Result Value Ref Range   WBC 16.2 (*) 4.0 - 10.5 K/uL   RBC 4.19  3.87 - 5.11 MIL/uL   Hemoglobin 12.5  12.0 - 15.0 g/dL   HCT 37.5  36.0 - 46.0 %   MCV 89.5  78.0 - 100.0 fL   MCH 29.8  26.0 - 34.0 pg   MCHC 33.3  30.0 - 36.0 g/dL   RDW 13.3  11.5 - 15.5 %   Platelets 122 (*) 150 - 400 K/uL  COMPREHENSIVE METABOLIC PANEL     Status: Abnormal   Collection Time    04/14/14  6:10 AM      Result Value Ref Range   Sodium 134 (*) 137 - 147 mEq/L   Potassium 4.1  3.7 - 5.3 mEq/L   Chloride 98  96 - 112 mEq/L   CO2 21  19 - 32 mEq/L   Glucose, Bld 127 (*) 70 - 99 mg/dL   BUN 11  6 - 23 mg/dL   Creatinine, Ser 1.11 (*) 0.50 - 1.10 mg/dL   Calcium 6.9 (*) 8.4 - 10.5 mg/dL   Total Protein 6.6  6.0 - 8.3 g/dL   Albumin 2.4 (*) 3.5 - 5.2 g/dL   AST 16  0 - 37 U/L   ALT 12  0 - 35 U/L   Alkaline Phosphatase 139 (*) 39 - 117 U/L   Total Bilirubin 0.3  0.3 - 1.2 mg/dL   GFR calc non Af Amer 66 (*) >90 mL/min   GFR calc Af Amer 77 (*) >90 mL/min  LACTATE DEHYDROGENASE     Status: None   Collection Time    04/14/14  6:10 AM      Result Value Ref Range   LDH 215  94 - 250 U/L  URIC ACID     Status: Abnormal   Collection Time    04/14/14  6:10 AM      Result Value Ref Range   Uric Acid, Serum 9.5 (*) 2.4 - 7.0 mg/dL     Assessment:  IOL for pre-e Hypermagnesemia (on 2 g/hr Magnesium Sulfate)  Plan: Magnesium Sulfate reduced to 1 g/hr. Discussed w/ Dr. Raphael Gibney.  Farrel Gordon CNM 04/14/2014, 7:51 AM

## 2014-04-14 NOTE — Progress Notes (Signed)
CRITICAL VALUE ALERT  Critical value received:  **Magnesium 7.7*  Date of notification:  04/14/14  Time of notification:  **0715*  Critical value read back:Yes.    Nurse who received alert: [facility]  MD notified (1st page):  Irena Reichmann CNM  Time of first page:  0720  MD notified (2nd page):  Time of second page:  Responding MD:  K williams cnm   Time MD responded:  [phone]

## 2014-04-14 NOTE — Anesthesia Postprocedure Evaluation (Signed)
Anesthesia Post Note  Patient: Katherine Marshall  Procedure(s) Performed: * No procedures listed *  Anesthesia type: Epidural  Patient location: Mother/Baby  Post pain: Pain level controlled  Post assessment: Post-op Vital signs reviewed  Last Vitals:  Filed Vitals:   04/14/14 1800  BP: 128/86  Pulse: 72  Temp:   Resp: 18    Post vital signs: Reviewed  Level of consciousness:alert  Complications: No apparent anesthesia complications

## 2014-04-14 NOTE — Progress Notes (Signed)
Magnesium level 7.7 this morning. Magnesium reduced to 1g/hr  Will repeat magnesium level at 1900 this evening and at 0700 tomorrow.  Pt w/o s/s of pre-e   Farrel Gordon, CNM, MS 04/14/14@2 :14 p.m.

## 2014-04-15 LAB — MAGNESIUM: Magnesium: 6 mg/dL — ABNORMAL HIGH (ref 1.5–2.5)

## 2014-04-15 LAB — CBC
HEMATOCRIT: 29.2 % — AB (ref 36.0–46.0)
Hemoglobin: 9.7 g/dL — ABNORMAL LOW (ref 12.0–15.0)
MCH: 29.6 pg (ref 26.0–34.0)
MCHC: 33.2 g/dL (ref 30.0–36.0)
MCV: 89 fL (ref 78.0–100.0)
Platelets: 99 10*3/uL — ABNORMAL LOW (ref 150–400)
RBC: 3.28 MIL/uL — AB (ref 3.87–5.11)
RDW: 13.3 % (ref 11.5–15.5)
WBC: 16.3 10*3/uL — ABNORMAL HIGH (ref 4.0–10.5)

## 2014-04-15 NOTE — Lactation Note (Signed)
This note was copied from the chart of Katherine Caira Cragle. Lactation Consultation Note  Baby out of room being circumcised.   Informed parents that baby may be sleepy for the next 4-6 hours after circ and mother should attempt to bf every 3 hours until baby starts waking to feed on his own. If baby is too sleepy to feed she can place baby STS.  Reviewed cluster feeding. Encouraged mother to call if assistance is needed.    Patient Name: Katherine Marshall [id number] Date: 04/15/2014 Reason for consult: Follow-up assessment   Maternal Data    Feeding    LATCH Score/Interventions                      Lactation Tools Discussed/Used     Consult Status Consult Status: PRN    Carlye Grippe 04/15/2014, 11:34 AM

## 2014-04-15 NOTE — Progress Notes (Signed)
04/15/14 1023  Vitals  BP ! 156/101 mmHg  MAP (mmHg) 114  Pulse Rate 64  Resp 18  Oxygen Therapy  SpO2 97 %  O2 Device None (Room air)  pt tearful and anxious about several things (when will she be able to shower? How will she be able to see all of her family members?) Pt expressing feelings of being  "overwhelmed". Emotional support provided.

## 2014-04-15 NOTE — Progress Notes (Signed)
Post Partum Day 1  Subjective:  no complaints and tolerating PO  No headaches, blurred vision, or RUQ tenderness.  Objective: Blood pressure 156/101, pulse 64, temperature 98.3 F (36.8 C), temperature source Oral, resp. rate 18, height 5' (1.524 m), weight 217 lb 1.6 oz (98.476 kg), last menstrual period 07/17/2013, SpO2 97.00%, unknown if currently breastfeeding.  Physical Exam:  General: no distress Lochia: appropriate Uterine Fundus: firm Incision: NA DVT Evaluation: No evidence of DVT seen on physical exam. Chest: Clear Heart: RRR Reflexes: 3/4 (no clonus).   Recent Labs  04/14/14 1140 04/15/14 0540  HGB 11.6* 9.7*  HCT 34.2* 29.2*   Magn 6  Assessment/Plan:  PP Day 1  PreEclampsia  Anemia  Plan for discharge tomorrow, Breastfeeding and Circumcision prior to discharge  DC magn after 24 hours.  OK to transfer to [facility] floor.   LOS: 3 days   STRINGER,ARTHUR V 04/15/2014, 10:46 AM

## 2014-04-16 MED ORDER — IBUPROFEN 600 MG PO TABS: 600.0000 mg | ORAL_TABLET | Freq: Four times a day (QID) | ORAL | Status: DC | PRN

## 2014-04-16 MED ORDER — OXYCODONE-ACETAMINOPHEN 5-325 MG PO TABS: 1.0000 | ORAL_TABLET | ORAL | Status: DC | PRN

## 2014-04-16 NOTE — Discharge Instructions (Signed)

## 2014-04-16 NOTE — Discharge Summary (Signed)
Vaginal Delivery Discharge Summary  Katherine Marshall  DOB:    [birth year] MRN:    [id number] CSN:    [id number]  Date of admission:                  04/12/14  Date of discharge:                   04/16/14  Procedures this admission:   SVB, magnesium sulfate for pre-eclampsia treatment, repair of 2nd degree laceration  Date of Delivery: 04/14/14  Newborn Data:  Live born female  Birth Weight: 5 lb 5 oz (2410 g) APGAR: 8, 9  Home with mother.  Circumcision Plan: Inpatient  History of Present Illness:  Katherine Marshall is a 28 y.o. female, G1P1001, who presents at [redacted]w[redacted]d weeks gestation. The patient has been followed at the [facility] and Gynecology division of [facility]. She was admitted induction of labor due to pre-eclampsia. Her pregnancy has been complicated by:  Patient Active Problem List   Diagnosis Date Noted  . Spontaneous vaginal delivery 04/14/2014  . Basal cell carcinoma of face--02/2014 04/13/2014  . IBS (irritable bowel syndrome) 04/13/2014  . Personal history of kidney stones--2010 04/13/2014  . Preeclampsia 04/12/2014  . Positive GBS test 04/12/2014  . SGA (small for gestational age)--16%ile at 39 weeks 04/12/2014     Hospital Course:  Admitted 04/12/14 due to dx of pre-eclampsia. Positive GBS. Progressed with [employer], then foley bulb, then pitocin. Utilized epidural for pain management.  Magnesium sulfate was administered during labor and for 24 hours post-delivery.  Delivery was performed by Farrel Gordon, CNM,  without complication. Patient and baby tolerated the procedure without difficulty, with 2nd degree perineal laceration noted. Infant status was stable and remained in room with mother.  Mother and infant then had an uncomplicated postpartum course, with breast feeding going well. Mom's physical exam was WNL, and she was discharged home in stable condition. Contraception plan was Nexplanon, with condoms in the interim.  She  received adequate benefit from po pain medications.  She did not require any po antihypertensives after delivery.  [facility] nurse is to see the patient later this week for BP evaluation.   Feeding:  breast  Contraception:  Nexplanon, initially condoms  Discharge hemoglobin:  Hemoglobin  Date Value Ref Range Status  04/15/2014 9.7* 12.0 - 15.0 g/dL Final     HCT  Date Value Ref Range Status  04/15/2014 29.2* 36.0 - 46.0 % Final    Discharge Physical Exam:   General: alert Lochia: appropriate Uterine Fundus: firm Incision:  Healing well DVT Evaluation: No evidence of DVT seen on physical exam. Negative Homan's sign. Tr/1+ edema  Intrapartum Procedures: spontaneous vaginal delivery and magnesium sulfate  Postpartum Procedures: Magnesium sulfate for 24 hours pp Complications-Operative and Postpartum: 2nd degree perineal laceration  Discharge Diagnoses: Term Pregnancy-delivered and Preelampsia  Discharge Information:  Activity:           pelvic rest Diet:                routine Medications: Ibuprofen and Percocet Condition:      stable Instructions:     Discharge to: home  Follow-up Information   Follow up with [facility]. Schedule an appointment as soon as possible for a visit in 6 weeks. (Call for any questions or concerns.)    Specialty:  Obstetrics and Gynecology   Contact information:   [address]  [id number] [place], Manuel Garcia CNM 04/16/2014 11:16 AM

## 2014-04-17 ENCOUNTER — Encounter (HOSPITAL_COMMUNITY): Payer: Self-pay | Admitting: *Deleted

## 2014-04-17 ENCOUNTER — Inpatient Hospital Stay (HOSPITAL_COMMUNITY)
Admission: AD | Admit: 2014-04-17 | Discharge: 2014-04-17 | Disposition: A | Discharge status: Home / Self Care | Payer: BC Managed Care – PPO | Source: Ambulatory Visit | Attending: Obstetrics and Gynecology | Admitting: Obstetrics and Gynecology

## 2014-04-17 DIAGNOSIS — K589 Irritable bowel syndrome without diarrhea: Secondary | ICD-10-CM | POA: Diagnosis not present

## 2014-04-17 DIAGNOSIS — O9989 Other specified diseases and conditions complicating pregnancy, childbirth and the puerperium: Principal | ICD-10-CM

## 2014-04-17 DIAGNOSIS — O99893 Other specified diseases and conditions complicating puerperium: Secondary | ICD-10-CM | POA: Insufficient documentation

## 2014-04-17 DIAGNOSIS — R7989 Other specified abnormal findings of blood chemistry: Secondary | ICD-10-CM | POA: Insufficient documentation

## 2014-04-17 DIAGNOSIS — N200 Calculus of kidney: Secondary | ICD-10-CM | POA: Diagnosis not present

## 2014-04-17 DIAGNOSIS — R030 Elevated blood-pressure reading, without diagnosis of hypertension: Secondary | ICD-10-CM | POA: Diagnosis present

## 2014-04-17 LAB — COMPREHENSIVE METABOLIC PANEL
ALBUMIN: 2.2 g/dL — AB (ref 3.5–5.2)
ALT: 39 U/L — ABNORMAL HIGH (ref 0–35)
AST: 48 U/L — ABNORMAL HIGH (ref 0–37)
Alkaline Phosphatase: 86 U/L (ref 39–117)
BUN: 16 mg/dL (ref 6–23)
CALCIUM: 8.7 mg/dL (ref 8.4–10.5)
CO2: 24 mEq/L (ref 19–32)
CREATININE: 1.04 mg/dL (ref 0.50–1.10)
Chloride: 103 mEq/L (ref 96–112)
GFR calc non Af Amer: 72 mL/min — ABNORMAL LOW (ref >90)
GFR, EST AFRICAN AMERICAN: 83 mL/min — AB (ref >90)
GLUCOSE: 76 mg/dL (ref 70–99)
Potassium: 4.5 mEq/L (ref 3.7–5.3)
Sodium: 137 mEq/L (ref 137–147)
TOTAL PROTEIN: 5.6 g/dL — AB (ref 6.0–8.3)
Total Bilirubin: <0.2 mg/dL — ABNORMAL LOW (ref 0.3–1.2)

## 2014-04-17 LAB — CBC
HEMATOCRIT: 28.4 % — AB (ref 36.0–46.0)
HEMOGLOBIN: 9.2 g/dL — AB (ref 12.0–15.0)
MCH: 29.9 pg (ref 26.0–34.0)
MCHC: 32.4 g/dL (ref 30.0–36.0)
MCV: 92.2 fL (ref 78.0–100.0)
Platelets: 118 10*3/uL — ABNORMAL LOW (ref 150–400)
RBC: 3.08 MIL/uL — ABNORMAL LOW (ref 3.87–5.11)
RDW: 13.2 % (ref 11.5–15.5)
WBC: 11 10*3/uL — ABNORMAL HIGH (ref 4.0–10.5)

## 2014-04-17 NOTE — Discharge Instructions (Signed)

## 2014-04-17 NOTE — MAU Note (Signed)
Patient states she had a vaginal delivery on 6-26 after IOL for preeclampsia. Husband states he took her blood pressure at home today with a regular cuff and on the leftt was 160/98 and the right 175/100. States she has a little visual changes. Denies headaches. Feels "off".

## 2014-04-17 NOTE — MAU Provider Note (Signed)
History     CSN: [id number]  Arrival date and time: 04/17/14 1803   None     Chief Complaint  Patient presents with  . Hypertension   HPI Comments: Pt is a G72P1, PPD3 s/p SVD, dc'd home yesterday, rcv'd mag sulfate in labor and 24hrs postpartum for pre-eclampsia. Arrives today to MAU unannounced w c/o BP being elevated at home per husband. Denies HA/N/V/RUQ pain, c/o fatigue, edema stable. Did not require antihypertensives.      Past Medical History  Diagnosis Date  . IBS (irritable bowel syndrome)   . Migraines     as a child  . Kidney stones     Past Surgical History  Procedure Laterality Date  . No past surgeries      Family History  Problem Relation Age of Onset  . Cancer Father     thyroid  . Hypertension Father   . Cancer Paternal Grandmother     colon  . Hypertension Paternal Grandmother   . Hypertension Mother   . Hypertension Brother   . Hypertension Maternal Grandmother   . Heart attack Maternal Grandmother   . Hypertension Maternal Grandfather   . Cancer Paternal Grandfather     brain  . Hypertension Paternal Grandfather     History  Substance Use Topics  . Smoking status: Never Smoker   . Smokeless tobacco: Never Used  . Alcohol Use: 1.5 - 2.0 oz/week    3-4 drink(s) per week     Comment: occ glass of wine    Allergies: No Known Allergies  Prescriptions prior to admission  Medication Sig Dispense Refill  . ibuprofen (ADVIL,MOTRIN) 200 MG tablet Take 800 mg by mouth every 6 (six) hours as needed for moderate pain.      . Prenatal Vit-Fe Fumarate-FA (PRENATAL MULTIVITAMIN) TABS tablet Take 1 tablet by mouth daily at 12 noon.      . promethazine (PHENERGAN) 12.5 MG tablet Take 1 tablet (12.5 mg total) by mouth every 6 (six) hours as needed for nausea or vomiting.  30 tablet  0  . ranitidine (ZANTAC) 150 MG tablet Take 150 mg by mouth 2 (two) times daily as needed for heartburn.        Review of Systems  All other systems reviewed and are  negative.  Physical Exam   Blood pressure 141/71, pulse 45, temperature 98.1 F (36.7 C), temperature source Oral, resp. rate 16, height 5' (1.524 m), weight 216 lb (97.977 kg), last menstrual period 07/17/2013, SpO2 99.00%, unknown if currently breastfeeding.  Filed Vitals:   04/17/14 2003 04/17/14 2018 04/17/14 2033 04/17/14 2048  BP: 132/69 135/70 141/66 141/71  Pulse: 45 45 43 45  Temp:      TempSrc:      Resp:      Height:      Weight:      SpO2:         Physical Exam  Nursing note and vitals reviewed. Constitutional: She is oriented to person, place, and time. She appears well-developed and well-nourished.  HENT:  Head: Normocephalic.  Eyes: Pupils are equal, round, and reactive to light.  Neck: Normal range of motion.  Cardiovascular: Normal rate, regular rhythm and normal heart sounds.   Respiratory: Effort normal and breath sounds normal.  GI: Soft. Bowel sounds are normal.  Genitourinary:  Deferred   Musculoskeletal: Normal range of motion. She exhibits edema.  3+ pedal edema   Neurological: She is alert and oriented to person, place, and time. She has  normal reflexes. She displays normal reflexes.  Skin: Skin is warm and dry.  Psychiatric: She has a normal mood and affect. Her behavior is normal.    MAU Course  Procedures    Assessment and Plan  PPD3, s/p SVD, and tx'd w mag sulfate for pre-eclampsia, dc'd home yesterday BF'ing, w some difficulty, now supplementing    Mildly elevated LFT's, CMET otherwise normal Platelets 119, increased from 99 on 6/27 hgb 9.2, stable BP's stable  Pt reassured Enc increasing PO fluids, resting at home,  [facility] nurse to take BP this week Call office if HA/N/V/RUQ pain dc'd home stable condition   LILLARD,SHELLEY M 04/17/2014, 9:22 PM

## 2014-04-18 ENCOUNTER — Ambulatory Visit (HOSPITAL_COMMUNITY): Payer: BC Managed Care – PPO

## 2014-06-27 ENCOUNTER — Ambulatory Visit: Payer: BC Managed Care – PPO | Admitting: Certified Nurse Midwife

## 2014-06-27 ENCOUNTER — Encounter: Payer: Self-pay | Admitting: Certified Nurse Midwife

## 2014-08-21 ENCOUNTER — Encounter (HOSPITAL_COMMUNITY): Payer: Self-pay | Admitting: *Deleted

## 2015-03-22 ENCOUNTER — Emergency Department (HOSPITAL_COMMUNITY): Payer: BLUE CROSS/BLUE SHIELD

## 2015-03-22 ENCOUNTER — Encounter (HOSPITAL_COMMUNITY): Payer: Self-pay | Admitting: Emergency Medicine

## 2015-03-22 ENCOUNTER — Observation Stay (HOSPITAL_COMMUNITY)
Admission: EM | Admit: 2015-03-22 | Discharge: 2015-03-23 | Disposition: A | Discharge status: Home / Self Care | Payer: BLUE CROSS/BLUE SHIELD | Attending: Internal Medicine | Admitting: Internal Medicine

## 2015-03-22 DIAGNOSIS — Z85828 Personal history of other malignant neoplasm of skin: Secondary | ICD-10-CM | POA: Insufficient documentation

## 2015-03-22 DIAGNOSIS — C4491 Basal cell carcinoma of skin, unspecified: Secondary | ICD-10-CM | POA: Diagnosis present

## 2015-03-22 DIAGNOSIS — Z808 Family history of malignant neoplasm of other organs or systems: Secondary | ICD-10-CM | POA: Insufficient documentation

## 2015-03-22 DIAGNOSIS — K589 Irritable bowel syndrome without diarrhea: Secondary | ICD-10-CM | POA: Diagnosis not present

## 2015-03-22 DIAGNOSIS — Z87442 Personal history of urinary calculi: Secondary | ICD-10-CM | POA: Diagnosis not present

## 2015-03-22 DIAGNOSIS — R9431 Abnormal electrocardiogram [ECG] [EKG]: Secondary | ICD-10-CM | POA: Diagnosis not present

## 2015-03-22 DIAGNOSIS — G43909 Migraine, unspecified, not intractable, without status migrainosus: Secondary | ICD-10-CM | POA: Insufficient documentation

## 2015-03-22 DIAGNOSIS — G43009 Migraine without aura, not intractable, without status migrainosus: Secondary | ICD-10-CM

## 2015-03-22 DIAGNOSIS — R55 Syncope and collapse: Secondary | ICD-10-CM | POA: Diagnosis not present

## 2015-03-22 DIAGNOSIS — K219 Gastro-esophageal reflux disease without esophagitis: Secondary | ICD-10-CM | POA: Diagnosis not present

## 2015-03-22 HISTORY — DX: Gastro-esophageal reflux disease without esophagitis: K21.9

## 2015-03-22 LAB — URINE MICROSCOPIC-ADD ON

## 2015-03-22 LAB — I-STAT BETA HCG BLOOD, ED (MC, WL, AP ONLY): I-stat hCG, quantitative: <5 m[IU]/mL (ref <5)

## 2015-03-22 LAB — URINALYSIS, ROUTINE W REFLEX MICROSCOPIC
BILIRUBIN URINE: NEGATIVE
GLUCOSE, UA: NEGATIVE mg/dL
Ketones, ur: NEGATIVE mg/dL
Nitrite: NEGATIVE
PH: 7 (ref 5.0–8.0)
Protein, ur: NEGATIVE mg/dL
Specific Gravity, Urine: 1.006 (ref 1.005–1.030)
UROBILINOGEN UA: 0.2 mg/dL (ref 0.0–1.0)

## 2015-03-22 LAB — PROTIME-INR
INR: 1.2 (ref 0.00–1.49)
Prothrombin Time: 15.3 seconds — ABNORMAL HIGH (ref 11.6–15.2)

## 2015-03-22 LAB — COMPREHENSIVE METABOLIC PANEL
ALBUMIN: 3.4 g/dL — AB (ref 3.5–5.0)
ALT: 10 U/L — ABNORMAL LOW (ref 14–54)
AST: 16 U/L (ref 15–41)
Alkaline Phosphatase: 58 U/L (ref 38–126)
Anion gap: 8 (ref 5–15)
BUN: 13 mg/dL (ref 6–20)
CO2: 27 mmol/L (ref 22–32)
Calcium: 8.7 mg/dL — ABNORMAL LOW (ref 8.9–10.3)
Chloride: 100 mmol/L — ABNORMAL LOW (ref 101–111)
Creatinine, Ser: 0.9 mg/dL (ref 0.44–1.00)
Glucose, Bld: 110 mg/dL — ABNORMAL HIGH (ref 65–99)
Potassium: 4.1 mmol/L (ref 3.5–5.1)
Sodium: 135 mmol/L (ref 135–145)
TOTAL PROTEIN: 7.2 g/dL (ref 6.5–8.1)
Total Bilirubin: 0.4 mg/dL (ref 0.3–1.2)

## 2015-03-22 LAB — CK: CK TOTAL: 56 U/L (ref 38–234)

## 2015-03-22 LAB — CBC
HCT: 38 % (ref 36.0–46.0)
Hemoglobin: 12.4 g/dL (ref 12.0–15.0)
MCH: 29.3 pg (ref 26.0–34.0)
MCHC: 32.6 g/dL (ref 30.0–36.0)
MCV: 89.8 fL (ref 78.0–100.0)
PLATELETS: 225 10*3/uL (ref 150–400)
RBC: 4.23 MIL/uL (ref 3.87–5.11)
RDW: 13 % (ref 11.5–15.5)
WBC: 8.5 10*3/uL (ref 4.0–10.5)

## 2015-03-22 LAB — CBG MONITORING, ED: Glucose-Capillary: 103 mg/dL — ABNORMAL HIGH (ref 65–99)

## 2015-03-22 LAB — I-STAT TROPONIN, ED: TROPONIN I, POC: 0 ng/mL (ref 0.00–0.08)

## 2015-03-22 LAB — TROPONIN I: Troponin I: <0.03 ng/mL (ref <0.031)

## 2015-03-22 LAB — ETHANOL: Alcohol, Ethyl (B): <5 mg/dL (ref <5)

## 2015-03-22 MED ORDER — SODIUM CHLORIDE 0.9 % IV SOLN
1000.0000 mL | Freq: Once | INTRAVENOUS | Status: AC
  Administered 2015-03-22: 1000 mL via INTRAVENOUS

## 2015-03-22 MED ORDER — HEPARIN SODIUM (PORCINE) 5000 UNIT/ML IJ SOLN
5000.0000 [IU] | Freq: Three times a day (TID) | INTRAMUSCULAR | Status: DC
  Administered 2015-03-22: 5000 [IU] via SUBCUTANEOUS
  Filled 2015-03-22 (×5): qty 1

## 2015-03-22 MED ORDER — FAMOTIDINE 20 MG PO TABS
20.0000 mg | ORAL_TABLET | Freq: Every day | ORAL | Status: DC
  Administered 2015-03-22 – 2015-03-23 (×2): 20 mg via ORAL
  Filled 2015-03-22 (×2): qty 1

## 2015-03-22 MED ORDER — ALUM & MAG HYDROXIDE-SIMETH 200-200-20 MG/5ML PO SUSP: 30.0000 mL | Freq: Four times a day (QID) | ORAL | Status: DC | PRN

## 2015-03-22 MED ORDER — SODIUM CHLORIDE 0.9 % IJ SOLN
3.0000 mL | Freq: Two times a day (BID) | INTRAMUSCULAR | Status: DC
  Administered 2015-03-22 – 2015-03-23 (×2): 3 mL via INTRAVENOUS

## 2015-03-22 MED ORDER — ACETAMINOPHEN 500 MG PO TABS: 500.0000 mg | ORAL_TABLET | ORAL | Status: DC | PRN

## 2015-03-22 MED ORDER — ONDANSETRON HCL 4 MG PO TABS: 4.0000 mg | ORAL_TABLET | Freq: Four times a day (QID) | ORAL | Status: DC | PRN

## 2015-03-22 MED ORDER — PSEUDOEPHEDRINE-ACETAMINOPHEN 30-500 MG PO TABS: 1.0000 | ORAL_TABLET | ORAL | Status: DC | PRN

## 2015-03-22 MED ORDER — CHLORHEXIDINE GLUCONATE 0.12 % MT SOLN
5.0000 mL | Freq: Every day | OROMUCOSAL | Status: DC
  Filled 2015-03-22: qty 15

## 2015-03-22 MED ORDER — ACETAMINOPHEN 325 MG PO TABS: 650.0000 mg | ORAL_TABLET | Freq: Four times a day (QID) | ORAL | Status: DC | PRN

## 2015-03-22 MED ORDER — PSEUDOEPHEDRINE HCL 30 MG PO TABS
30.0000 mg | ORAL_TABLET | ORAL | Status: DC | PRN
  Filled 2015-03-22 (×2): qty 1

## 2015-03-22 MED ORDER — IBUPROFEN 200 MG PO TABS
600.0000 mg | ORAL_TABLET | Freq: Three times a day (TID) | ORAL | Status: DC | PRN
  Administered 2015-03-22 – 2015-03-23 (×3): 600 mg via ORAL
  Filled 2015-03-22 (×3): qty 3

## 2015-03-22 MED ORDER — AMOXICILLIN 500 MG PO CAPS
500.0000 mg | ORAL_CAPSULE | Freq: Three times a day (TID) | ORAL | Status: DC
  Administered 2015-03-22 – 2015-03-23 (×2): 500 mg via ORAL
  Filled 2015-03-22 (×4): qty 1

## 2015-03-22 MED ORDER — ONDANSETRON HCL 4 MG/2ML IJ SOLN: 4.0000 mg | Freq: Four times a day (QID) | INTRAMUSCULAR | Status: DC | PRN

## 2015-03-22 MED ORDER — SODIUM CHLORIDE 0.9 % IV SOLN
1000.0000 mL | INTRAVENOUS | Status: DC
  Administered 2015-03-22 – 2015-03-23 (×3): 1000 mL via INTRAVENOUS

## 2015-03-22 MED ORDER — ACETAMINOPHEN 650 MG RE SUPP: 650.0000 mg | Freq: Four times a day (QID) | RECTAL | Status: DC | PRN

## 2015-03-22 NOTE — H&P (Signed)
[facility] History and Physical  Katherine Marshall YNW:[id number] DOB: [birth year] DOA: 03/22/2015  Referring physician: ED physician PCP: Gara Kroner, MD  Specialists:   Chief Complaint: syncope  HPI: Katherine Marshall is a 29 y.o. female with PMH of IBS, migraine headaches, GERD, who presents with syncope  Patient states that after she used bathroom and urinated, she hurt her finger. She then began to feel lightheaded and sat down and does not remember a period of time. Her friend told her that she entered the room, and said that she was not doing okay, then they laid her down on the floor. Immediately upon laying her on the floor she improved. There is no clear postictal state according to her friend. She might have muscle fasciculation, but not like seizure per friends. She denies any episodes similar to this in the past. Of note, she had a tooth extraction couple of weeks ago and has been on anti-biotics due to sinus tract, she still needs to take amoxicillin for 13 day per her oral surgeon.   Currently patient denies fever, chills, running nose, ear pain, headaches, cough, chest pain, SOB, abdominal pain, diarrhea, constipation, dysuria, urgency, frequency, hematuria, skin rashes, joint pain or leg swelling. No unilateral weakness, numbness or tingling sensations. No vision change or hearing loss.  In ED, patient was found to have negative CT head, WBC 8.5, temperature normal, no tachycardia, electrolytes okay, urinalysis with trace amount of leukocytes, but no symptoms or UTI, negative troponin. EKG showed ectopic atrial rhythm. Vision is admitted to inpatient for further evaluation and treatment. Neurology was consulted.  Where does patient live?   At home    Can patient participate in ADLs?  Yes    Review of Systems:   General: no fevers, chills, no changes in body weight, normal appetite, little fatigue HEENT: no blurry vision, hearing changes or sore throat Pulm: no dyspnea,  coughing, wheezing CV: no chest pain, palpitations Abd: no nausea, vomiting, abdominal pain, diarrhea, constipation GU: no dysuria, burning on urination, increased urinary frequency, hematuria  Ext: no leg edema Neuro: no unilateral weakness, numbness, or tingling, no vision change or hearing loss Skin: no rash MSK: No muscle spasm, no deformity, no limitation of range of movement in spin Heme: No easy bruising.  Travel history: No recent long distant travel.  Allergy: No Known Allergies  Past Medical History  Diagnosis Date  . IBS (irritable bowel syndrome)   . Migraines     as a child  . Kidney stones   . GERD (gastroesophageal reflux disease)     Past Surgical History  Procedure Laterality Date  . Dental surgery    . Basal cell carcinoma excision      Social History:  reports that she has never smoked. She has never used smokeless tobacco. She reports that she drinks about 1.5 - 2.0 oz of alcohol per week. She reports that she does not use illicit drugs.  Family History:  Family History  Problem Relation Age of Onset  . Cancer Father     thyroid  . Hypertension Father   . Cancer Paternal Grandmother     colon  . Hypertension Paternal Grandmother   . Hypertension Mother   . Hypertension Brother   . Hypertension Maternal Grandmother   . Heart attack Maternal Grandmother   . Hypertension Maternal Grandfather   . Cancer Paternal Grandfather     brain  . Hypertension Paternal Grandfather      Prior to Admission medications  Medication Sig Start Date End Date Taking? Authorizing Provider  amoxicillin (AMOXIL) 500 MG capsule Take 500 mg by mouth 3 (three) times daily. Started 03/21/15, for 14 days ending 04/03/15 03/05/15  Yes Historical Provider, MD  chlorhexidine (PERIDEX) 0.12 % solution Use as directed 5 mLs in the mouth or throat daily. 03/05/15  Yes Historical Provider, MD  ibuprofen (ADVIL,MOTRIN) 600 MG tablet Take 600 mg by mouth 3 (three) times daily as needed  for moderate pain.  02/21/15  Yes Historical Provider, MD  pseudoephedrine-acetaminophen (TYLENOL SINUS) 30-500 MG TABS Take 1 tablet by mouth every 4 (four) hours as needed.   Yes Historical Provider, MD  promethazine (PHENERGAN) 12.5 MG tablet Take 1 tablet (12.5 mg total) by mouth every 6 (six) hours as needed for nausea or vomiting. 03/18/14   Linda Hedges, CNM  ranitidine (ZANTAC) 150 MG tablet Take 150 mg by mouth 2 (two) times daily as needed for heartburn.    Historical Provider, MD    Physical Exam: Filed Vitals:   03/22/15 2030 03/22/15 2122 03/23/15 0500 03/23/15 0534  BP: 115/78 112/78 113/68   Pulse: 91 86 68   Temp:  98.5 F (36.9 C) 98.1 F (36.7 C)   TempSrc:  Oral Oral   Resp: 21 20 18    Height:  5\' 1"  (1.549 m)    Weight:  79.924 kg (176 lb 3.2 oz)  80.1 kg (176 lb 9.4 oz)  SpO2: 98% 100% 93%    General: Not in acute distress HEENT:       Eyes: PERRL, EOMI, no scleral icterus.       ENT: No discharge from the ears and nose, no pharynx injection, no tonsillar enlargement.        Neck: No JVD, no bruit, no mass felt. Heme: No neck lymph node enlargement. Cardiac: S1/S2, RRR, No murmurs, No gallops or rubs. Pulm: Good air movement bilaterally. No rales, wheezing, rhonchi or rubs. Abd: Soft, nondistended, nontender, no rebound pain, no organomegaly, BS present. Ext: No pitting leg edema bilaterally. 2+DP/PT pulse bilaterally. Musculoskeletal: No joint deformities, No joint redness or warmth, no limitation of ROM in spin. Skin: No rashes.  Neuro: Alert, oriented X3, cranial nerves II-XII grossly intact, muscle strength 5/5 in all extremities, sensation to light touch intact. Brachial reflex 2+ bilaterally. Knee reflex 1+ bilaterally. Negative Babinski's sign. Normal finger to nose test. Psych: Patient is not psychotic, no suicidal or hemocidal ideation.  Labs on Admission:  Basic Metabolic Panel:  Recent Labs Lab 03/22/15 1738 03/23/15 0327  NA 135 139  K 4.1  4.2  CL 100* 109  CO2 27 25  GLUCOSE 110* 111*  BUN 13 10  CREATININE 0.90 0.83  CALCIUM 8.7* 8.1*   Liver Function Tests:  Recent Labs Lab 03/22/15 1738  AST 16  ALT 10*  ALKPHOS 58  BILITOT 0.4  PROT 7.2  ALBUMIN 3.4*   No results for input(s): LIPASE, AMYLASE in the last 168 hours. No results for input(s): AMMONIA in the last 168 hours. CBC:  Recent Labs Lab 03/22/15 1738 03/23/15 0327  WBC 8.5 7.8  HGB 12.4 11.4*  HCT 38.0 35.2*  MCV 89.8 91.7  PLT 225 195   Cardiac Enzymes:  Recent Labs Lab 03/22/15 2236 03/23/15 0327  CKTOTAL 56  --   TROPONINI <0.03 <0.03    BNP (last 3 results) No results for input(s): BNP in the last 8760 hours.  ProBNP (last 3 results) No results for input(s): PROBNP in the last 8760  hours.  CBG:  Recent Labs Lab 03/22/15 1810 03/23/15 0539  GLUCAP 103* 89    Radiological Exams on Admission: Ct Head Wo Contrast  03/22/2015   CLINICAL DATA:  Syncopal episode at work today. Loss of consciousness. Initial encounter.  EXAM: CT HEAD WITHOUT CONTRAST  TECHNIQUE: Contiguous axial images were obtained from the base of the skull through the vertex without intravenous contrast.  COMPARISON:  None.  FINDINGS: There is no evidence of acute intracranial hemorrhage, mass lesion, brain edema or extra-axial fluid collection. The ventricles and subarachnoid spaces are appropriately sized for age. There is no CT evidence of acute cortical infarction.  There is near complete opacification of the left maxillary and ethmoid sinuses with a maxillary sinus air-fluid level. The left frontal sinus is hypoplastic. The walls of the left maxillary sinus appear thinned. There is a focal defect posterolaterally on image 1. No definite acute facial fractures identified. The right-sided paranasal sinuses are clear. The mastoid air cells and middle ears are clear. The calvarium is intact.  IMPRESSION: 1. No acute intracranial findings. 2. Left maxillary and  ethmoid sinus disease with irregularity of the walls of the left maxillary sinus, presumably secondary to chronic sinusitis or previous intervention. No definite acute osseous findings.   Electronically Signed   By: Richardean Sale M.D.   On: 03/22/2015 19:10    EKG: Independently reviewed.  Abnormal findings: Ectopic atrial rhythm   Assessment/Plan Principal Problem:   Syncope Active Problems:   Basal cell carcinoma of face--02/2014   Migraines   Abnormal EKG   GERD (gastroesophageal reflux disease)  Syncope: Etiology is not clear. Neurology was consulted, Dr. Leonel Ramsay saw patient, thinks it is most likely due to syncope, less likely seizure. Patient has a ectopic atrial rhythm, making cardiac arrhythmia an important differential diagnosis.  -will admit to tele bed -Appreciate Dr. Cecil Cobbs consultation, with follow-up recommendations to get EEG. -trop x 3 -EKG in AM -2d echo -UDS, HIV ab, alcohol level, TSH, free T4 and T3 -check ortho static vital signs -IVF  GERD: -Pepcid  Sinus Tract: -continue Amoxicillin which is per oral surgeon   DVT ppx: SQ Heparin    Code Status: Full code Family Communication: None at bed side.   Disposition Plan: Admit to inpatient   Date of Service 03/23/2015    Ivor Costa [facility] Pager [phone]  If 7PM-7AM, please contact night-coverage [email or web address] Password [email or web address] 03/23/2015, 7:06 AM

## 2015-03-22 NOTE — ED Notes (Signed)
MD Nui at the bedside.

## 2015-03-22 NOTE — ED Notes (Signed)
Patient being transported upstairs by [place], EMT

## 2015-03-22 NOTE — ED Provider Notes (Signed)
CSN: [id number]     Arrival date & time 03/22/15  1648 History   First MD Initiated Contact with Patient 03/22/15 1648     Chief Complaint  Patient presents with  . Loss of Consciousness     (Consider location/radiation/quality/duration/timing/severity/associated sxs/prior Treatment) Patient is a 29 y.o. female presenting with syncope. The history is provided by the patient, medical records, a significant other and the EMS personnel. No language interpreter was used.  Loss of Consciousness Associated symptoms: no chest pain, no diaphoresis, no fever, no headaches, no nausea, no shortness of breath and no vomiting     Katherine Marshall is a 29 y.o. female  with a hx of IBS presents to the Emergency Department complaining of acute syncopal episode onset 1 hour PTA.  Pt reports she was at work, went to the bathroom to urinate and when she began to walk back to her desk felt lightheaded. She sat in a nearby chair and called to a co-worker which is the last thing she remembers before waking on the floor.  Patient denies feeling hot, short of breath or having chest pain or palpitations prior to the incident.  She reports she has had 40 ounces of water to drink today.  She denies personal or family cardiac history.  Patient reports she is currently being treated with amoxicillin for a sinus tract from wisdom tooth extraction earlier in the month that became infected.  Pt reports 3 doses yesterday and 2 today. She has no known allergies, no rash, SOB or feeling throat closing.    Per EMS, patient's coworker stated that patient passed out while sitting in the chair, was lowered to the floor and had several bouts of muscle fasciculations. Patient was also incontinent of urine. EMS reports that upon their arrival patient was awake, alert and oriented. Patient's coworker reported that she was unconscious for several minutes.    Past Medical History  Diagnosis Date  . IBS (irritable bowel syndrome)   .  Migraines     as a child  . Kidney stones    Past Surgical History  Procedure Laterality Date  . No past surgeries     Family History  Problem Relation Age of Onset  . Cancer Father     thyroid  . Hypertension Father   . Cancer Paternal Grandmother     colon  . Hypertension Paternal Grandmother   . Hypertension Mother   . Hypertension Brother   . Hypertension Maternal Grandmother   . Heart attack Maternal Grandmother   . Hypertension Maternal Grandfather   . Cancer Paternal Grandfather     brain  . Hypertension Paternal Grandfather    History  Substance Use Topics  . Smoking status: Never Smoker   . Smokeless tobacco: Never Used  . Alcohol Use: 1.5 - 2.0 oz/week    3-4 drink(s) per week     Comment: occ glass of wine   OB History    Gravida Para Term Preterm AB TAB SAB Ectopic Multiple Living   1 1 1  0 0 0 0 0 0 1     Review of Systems  Constitutional: Negative for fever, diaphoresis, appetite change, fatigue and unexpected weight change.  HENT: Negative for mouth sores.   Eyes: Negative for visual disturbance.  Respiratory: Negative for cough, chest tightness, shortness of breath and wheezing.   Cardiovascular: Positive for syncope. Negative for chest pain.  Gastrointestinal: Negative for nausea, vomiting, abdominal pain, diarrhea and constipation.  Endocrine: Negative for polydipsia, polyphagia  and polyuria.  Genitourinary: Negative for dysuria, urgency, frequency and hematuria.  Musculoskeletal: Negative for back pain and neck stiffness.  Skin: Negative for rash.  Allergic/Immunologic: Negative for immunocompromised state.  Neurological: Positive for syncope. Negative for light-headedness and headaches.  Hematological: Does not bruise/bleed easily.  Psychiatric/Behavioral: Negative for sleep disturbance. The patient is not nervous/anxious.       Allergies  Review of patient's allergies indicates no known allergies.  Home Medications   Prior to  Admission medications   Medication Sig Start Date End Date Taking? Authorizing Provider  amoxicillin (AMOXIL) 500 MG capsule Take 500 mg by mouth 3 (three) times daily. Started 03/21/15, for 14 days ending 04/03/15 03/05/15  Yes Historical Provider, MD  chlorhexidine (PERIDEX) 0.12 % solution Use as directed 5 mLs in the mouth or throat daily. 03/05/15  Yes Historical Provider, MD  ibuprofen (ADVIL,MOTRIN) 600 MG tablet Take 600 mg by mouth 3 (three) times daily as needed for moderate pain.  02/21/15  Yes Historical Provider, MD  pseudoephedrine-acetaminophen (TYLENOL SINUS) 30-500 MG TABS Take 1 tablet by mouth every 4 (four) hours as needed.   Yes Historical Provider, MD  promethazine (PHENERGAN) 12.5 MG tablet Take 1 tablet (12.5 mg total) by mouth every 6 (six) hours as needed for nausea or vomiting. 03/18/14   Linda Hedges, CNM  ranitidine (ZANTAC) 150 MG tablet Take 150 mg by mouth 2 (two) times daily as needed for heartburn.    Historical Provider, MD   BP 124/80 mmHg  Pulse 98  Temp(Src) 98.2 F (36.8 C) (Oral)  Resp 23  Ht 5\' 1"  (1.549 m)  Wt 170 lb (77.111 kg)  BMI 32.14 kg/m2  SpO2 100%  LMP 03/15/2015 (Exact Date) Physical Exam  Constitutional: She is oriented to person, place, and time. She appears well-developed and well-nourished. No distress.  HENT:  Head: Normocephalic and atraumatic.  Mouth/Throat: Oropharynx is clear and moist.  Eyes: Conjunctivae and EOM are normal. Pupils are equal, round, and reactive to light. No scleral icterus.  No horizontal, vertical or rotational nystagmus  Neck: Normal range of motion. Neck supple.  Full active and passive ROM without pain No midline or paraspinal tenderness No nuchal rigidity or meningeal signs  Cardiovascular: Normal rate, regular rhythm, normal heart sounds and intact distal pulses.   No murmur heard. Pulmonary/Chest: Effort normal and breath sounds normal. No respiratory distress. She has no wheezes. She has no rales.   Abdominal: Soft. Bowel sounds are normal. There is no tenderness. There is no rebound and no guarding.  Musculoskeletal: Normal range of motion.  Lymphadenopathy:    She has no cervical adenopathy.  Neurological: She is alert and oriented to person, place, and time. She has normal reflexes. No cranial nerve deficit. She exhibits normal muscle tone. Coordination normal.  Mental Status:  Alert, oriented, thought content appropriate. Speech fluent without evidence of aphasia. Able to follow 2 step commands without difficulty.  Cranial Nerves:  II:  Peripheral visual fields grossly normal, pupils equal, round, reactive to light III,IV, VI: ptosis not present, extra-ocular motions intact bilaterally  V,VII: smile symmetric, facial light touch sensation equal VIII: hearing grossly normal bilaterally  IX,X: gag reflex present  XI: bilateral shoulder shrug equal and strong XII: midline tongue extension  Motor:  5/5 in upper and lower extremities bilaterally including strong and equal grip strength and dorsiflexion/plantar flexion Sensory: Pinprick and light touch normal in all extremities.  Deep Tendon Reflexes: 2+ and symmetric  Cerebellar: normal finger-to-nose with bilateral upper extremities  Gait: normal gait and balance CV: distal pulses palpable throughout   Skin: Skin is warm and dry. No rash noted. She is not diaphoretic.  Psychiatric: She has a normal mood and affect. Her behavior is normal. Judgment and thought content normal.  Nursing note and vitals reviewed.   ED Course  Procedures (including critical care time) Labs Review Labs Reviewed  COMPREHENSIVE METABOLIC PANEL - Abnormal; Notable for the following:    Chloride 100 (*)    Glucose, Bld 110 (*)    Calcium 8.7 (*)    Albumin 3.4 (*)    ALT 10 (*)    All other components within normal limits  URINALYSIS, ROUTINE W REFLEX MICROSCOPIC (NOT AT [facility]) - Abnormal; Notable for the following:    APPearance CLOUDY (*)    Hgb  urine dipstick MODERATE (*)    Leukocytes, UA TRACE (*)    All other components within normal limits  URINE MICROSCOPIC-ADD ON - Abnormal; Notable for the following:    Squamous Epithelial / LPF FEW (*)    All other components within normal limits  CBG MONITORING, ED - Abnormal; Notable for the following:    Glucose-Capillary 103 (*)    All other components within normal limits  CBC  POCT CBG (FASTING - GLUCOSE)-MANUAL ENTRY  I-STAT TROPOININ, ED  I-STAT BETA HCG BLOOD, ED ([facility], [facility], [facility] ONLY)    Imaging Review Ct Head Wo Contrast  03/22/2015   CLINICAL DATA:  Syncopal episode at work today. Loss of consciousness. Initial encounter.  EXAM: CT HEAD WITHOUT CONTRAST  TECHNIQUE: Contiguous axial images were obtained from the base of the skull through the vertex without intravenous contrast.  COMPARISON:  None.  FINDINGS: There is no evidence of acute intracranial hemorrhage, mass lesion, brain edema or extra-axial fluid collection. The ventricles and subarachnoid spaces are appropriately sized for age. There is no CT evidence of acute cortical infarction.  There is near complete opacification of the left maxillary and ethmoid sinuses with a maxillary sinus air-fluid level. The left frontal sinus is hypoplastic. The walls of the left maxillary sinus appear thinned. There is a focal defect posterolaterally on image 1. No definite acute facial fractures identified. The right-sided paranasal sinuses are clear. The mastoid air cells and middle ears are clear. The calvarium is intact.  IMPRESSION: 1. No acute intracranial findings. 2. Left maxillary and ethmoid sinus disease with irregularity of the walls of the left maxillary sinus, presumably secondary to chronic sinusitis or previous intervention. No definite acute osseous findings.   Electronically Signed   By: Richardean Sale M.D.   On: 03/22/2015 19:10     EKG Interpretation   Date/Time:  Thursday March 22 2015 17:07:37 EDT Ventricular Rate:  81 PR  Interval:  114 QRS Duration: 78 QT Interval:  360 QTC Calculation: 418 R Axis:   47 Text Interpretation:  Ectopic atrial rhythm Borderline short PR interval  Low voltage, precordial leads Borderline T abnormalities, diffuse leads  abnormal atrial rythm. Nonspecific Twave. Confirmed by Johnney Killian, MD, Jeannie Done  [phone]) on 03/22/2015 5:20:34 PM      MDM   Final diagnoses:  Syncope and collapse  Abnormal ECG   Sanam Avellino presents with syncope.  No cardiac hx.  Movement witnessed by co-worker likely muscle fasciculations from hypoxia; doubt seizures activity as she had no post-ictal period.    Will obtain labs, ECG and CT head with questionable seizure activity.    7:40 PM Labs reassuring however patient's EKG is abnormal. She has  had no previous sutures unknown if this is baseline. She has no chest pain or shortness of breath.  CT without acute abnormality.  As pt has abnormal ECG she is not low risk, will proceed with admission overnight for observation.    8:13 PM Pt admitted by Dr. Blaine Hamper to tele.  BP 124/80 mmHg  Pulse 98  Temp(Src) 98.2 F (36.8 C) (Oral)  Resp 23  Ht 5\' 1"  (1.549 m)  Wt 170 lb (77.111 kg)  BMI 32.14 kg/m2  SpO2 100%  LMP 03/15/2015 (Exact Date)   Abigail Butts, PA-C 03/22/15 2013  Charlesetta Shanks, MD 03/22/15 2234

## 2015-03-22 NOTE — ED Notes (Signed)
  CBG 103  

## 2015-03-22 NOTE — ED Notes (Signed)
Phlebotomy at the bedside  

## 2015-03-22 NOTE — ED Notes (Signed)
Neurology at the bedside

## 2015-03-22 NOTE — ED Notes (Signed)
Per EMS- pt here from work with syncopal episode and possible seizure activity while at work. Pt reports walking briskly from bathroom to office when she "felt bad" and sat down. . Co workers reported jerking motion while the pt was unconscious for approx 1-2 mins. EMS reports a/o x 4 upon arrival with some incontinence of urine. Pt ambulatory to room.

## 2015-03-22 NOTE — Consult Note (Signed)
Neurology Consultation Reason for Consult: Syncope Referring Physician: Mora Bellman  CC: Syncope  History is obtained from: Patient, friend  HPI: Katherine Marshall is a 29 y.o. female with a history of migraines, IBS who presents with syncope. She states that she had been coming from the bathroom and hurt her finger just prior. She then began to feel lightheaded and sat down and does not remember a period of time. Her friend states that she entered the room and she was able to say she was not doing okay then they laid her down on the floor. Immediately upon laying her on the floor she improved. There is no clear postictal state according to her friend.  She denies any episodes similar to this in the past. There was no clear seizure-like abnormal movements according to the friend.  Also of note, she had a tooth extraction couple of weeks ago and was on anti-biotics, finish those about one week ago and then was noted to have a communication with her sinus and therefore was restarted on antibiotics.   Her friend also noticed that she was clammy.  ROS: A 14 point ROS was performed and is negative except as noted in the HPI.   Past Medical History  Diagnosis Date  . IBS (irritable bowel syndrome)   . Migraines     as a child  . Kidney stones     Family History: No history of seizures  Social History: Tob: Denies  Exam: Current vital signs: BP 115/78 mmHg  Pulse 91  Temp(Src) 98.2 F (36.8 C) (Oral)  Resp 21  Ht 5\' 1"  (1.549 m)  Wt 77.111 kg (170 lb)  BMI 32.14 kg/m2  SpO2 98%  LMP 03/15/2015 (Exact Date) Vital signs in last 24 hours: Temp:  [98.2 F (36.8 C)] 98.2 F (36.8 C) (06/02 1709) Pulse Rate:  [73-98] 91 (06/02 2030) Resp:  [16-23] 21 (06/02 2030) BP: (95-124)/(66-80) 115/78 mmHg (06/02 2030) SpO2:  [96 %-100 %] 98 % (06/02 2030) Weight:  [77.111 kg (170 lb)] 77.111 kg (170 lb) (06/02 1705)  Physical Exam  Constitutional: Appears well-developed and well-nourished.   Psych: Affect appropriate to situation Eyes: No scleral injection HENT: No OP obstrucion Head: Normocephalic.  Cardiovascular: Normal rate and regular rhythm.  Respiratory: Effort normal and breath sounds normal to anterior ascultation GI: Soft.  No distension. There is no tenderness.  Skin: WDI  Neuro: Mental Status: Patient is awake, alert, oriented to person, place, month, year, and situation. Patient is able to give a clear and coherent history. No signs of aphasia or neglect Cranial Nerves: II: Visual Fields are full. Pupils are equal, round, and reactive to light.   III,IV, VI: EOMI without ptosis or diploplia.  V: Facial sensation is symmetric to temperature VII: Facial movement is symmetric.  VIII: hearing is intact to voice X: Uvula elevates symmetrically XI: Shoulder shrug is symmetric. XII: tongue is midline without atrophy or fasciculations.  Motor: Tone is normal. Bulk is normal. 5/5 strength was present in all four extremities.  Sensory: Sensation is symmetric to light touch and temperature in the arms and legs. Deep Tendon Reflexes: 2+ and symmetric in the biceps and ankles Cerebellar: FNF  intact bilaterally     I have reviewed labs in epic and the results pertinent to this consultation are: Negative beta hCG CMP was unremarkable  I have reviewed the images obtained: CT head was unremarkable  Impression: 29 year old female with syncope. I have very low suspicion for seizures, though an  EEG could be reasonable. Vasovagal syncope could be a possibility, though with her abnormal EKG I certainly think that cardiac causes of syncope need to be ruled out as well. Her rapid improvement with laying her down flat is very suggestive that it was hypoperfusion causing her syncope.  Recommendations: 1) EEG, if normal then no further recommendations and neurology will sign off. 2) further evaluations of cardiac causes of syncope per internal medicine.   Roland Rack, MD [facility] [phone]  If 7pm- 7am, please page neurology on call as listed in [place].

## 2015-03-23 ENCOUNTER — Inpatient Hospital Stay (HOSPITAL_COMMUNITY): Payer: BLUE CROSS/BLUE SHIELD

## 2015-03-23 ENCOUNTER — Observation Stay (HOSPITAL_BASED_OUTPATIENT_CLINIC_OR_DEPARTMENT_OTHER): Payer: BLUE CROSS/BLUE SHIELD

## 2015-03-23 ENCOUNTER — Encounter (HOSPITAL_COMMUNITY): Payer: Self-pay | Admitting: Internal Medicine

## 2015-03-23 DIAGNOSIS — R9431 Abnormal electrocardiogram [ECG] [EKG]: Secondary | ICD-10-CM | POA: Diagnosis not present

## 2015-03-23 DIAGNOSIS — R55 Syncope and collapse: Secondary | ICD-10-CM

## 2015-03-23 DIAGNOSIS — K219 Gastro-esophageal reflux disease without esophagitis: Secondary | ICD-10-CM | POA: Diagnosis present

## 2015-03-23 DIAGNOSIS — G43909 Migraine, unspecified, not intractable, without status migrainosus: Secondary | ICD-10-CM | POA: Diagnosis not present

## 2015-03-23 LAB — BASIC METABOLIC PANEL
Anion gap: 5 (ref 5–15)
BUN: 10 mg/dL (ref 6–20)
CALCIUM: 8.1 mg/dL — AB (ref 8.9–10.3)
CO2: 25 mmol/L (ref 22–32)
CREATININE: 0.83 mg/dL (ref 0.44–1.00)
Chloride: 109 mmol/L (ref 101–111)
GFR calc Af Amer: >60 mL/min (ref >60)
GFR calc non Af Amer: >60 mL/min (ref >60)
Glucose, Bld: 111 mg/dL — ABNORMAL HIGH (ref 65–99)
Potassium: 4.2 mmol/L (ref 3.5–5.1)
Sodium: 139 mmol/L (ref 135–145)

## 2015-03-23 LAB — CBC
HCT: 35.2 % — ABNORMAL LOW (ref 36.0–46.0)
HEMOGLOBIN: 11.4 g/dL — AB (ref 12.0–15.0)
MCH: 29.7 pg (ref 26.0–34.0)
MCHC: 32.4 g/dL (ref 30.0–36.0)
MCV: 91.7 fL (ref 78.0–100.0)
PLATELETS: 195 10*3/uL (ref 150–400)
RBC: 3.84 MIL/uL — ABNORMAL LOW (ref 3.87–5.11)
RDW: 13.1 % (ref 11.5–15.5)
WBC: 7.8 10*3/uL (ref 4.0–10.5)

## 2015-03-23 LAB — HIV ANTIBODY (ROUTINE TESTING W REFLEX): HIV Screen 4th Generation wRfx: NONREACTIVE

## 2015-03-23 LAB — TSH: TSH: 3.332 u[IU]/mL (ref 0.350–4.500)

## 2015-03-23 LAB — RAPID URINE DRUG SCREEN, HOSP PERFORMED
Amphetamines: NOT DETECTED
BARBITURATES: NOT DETECTED
Benzodiazepines: NOT DETECTED
Cocaine: NOT DETECTED
OPIATES: NOT DETECTED
TETRAHYDROCANNABINOL: NOT DETECTED

## 2015-03-23 LAB — GLUCOSE, CAPILLARY: Glucose-Capillary: 89 mg/dL (ref 65–99)

## 2015-03-23 LAB — TROPONIN I: Troponin I: <0.03 ng/mL (ref <0.031)

## 2015-03-23 LAB — T4, FREE: Free T4: 0.92 ng/dL (ref 0.61–1.12)

## 2015-03-23 NOTE — Progress Notes (Signed)
  Echocardiogram 2D Echocardiogram has been performed.  Katherine Marshall FRANCES 03/23/2015, 10:37 AM

## 2015-03-23 NOTE — Progress Notes (Signed)
EEG Completed; Results Pending  

## 2015-03-23 NOTE — Discharge Summary (Signed)
Physician Discharge Summary  Katherine Marshall WUJ:[id number] DOB: 07-Oct-1986 DOA: 03/22/2015  PCP: Gara Kroner, MD  Admit date: 03/22/2015 Discharge date: 03/23/2015  Time spent: > 30 minutes  Recommendations for Outpatient Follow-up:  1. Follow up with Dr. Moreen Fowler in 2-4 weeks   Discharge Diagnoses:  Principal Problem:   Syncope Active Problems:   Basal cell carcinoma of face--02/2014   Migraines   Abnormal EKG   GERD (gastroesophageal reflux disease)  Discharge Condition: stable  Diet recommendation: regular  Filed Weights   03/22/15 1705 03/22/15 2122 03/23/15 0534  Weight: 77.111 kg (170 lb) 79.924 kg (176 lb 3.2 oz) 80.1 kg (176 lb 9.4 oz)   History of present illness:  Katherine Marshall is a 29 y.o. female with PMH of IBS, migraine headaches, GERD, who presents with syncope. Patient states that after she used bathroom and urinated, she hurt her finger. She then began to feel lightheaded and sat down and does not remember a period of time. Her friend told her that she entered the room, and said that she was not doing okay, then they laid her down on the floor. Immediately upon laying her on the floor she improved. There is no clear postictal state according to her friend. She might have muscle fasciculation, but not like seizure per friends. She denies any episodes similar to this in the past. Of note, she had a tooth extraction couple of weeks ago and has been on anti-biotics due to sinus tract, she still needs to take amoxicillin for 13 day per her oral surgeon. Currently patient denies fever, chills, running nose, ear pain, headaches, cough, chest pain, SOB, abdominal pain, diarrhea, constipation, dysuria, urgency, frequency, hematuria, skin rashes, joint pain or leg swelling. No unilateral weakness, numbness or tingling sensations. No vision change or hearing loss.In ED, patient was found to have negative CT head, WBC 8.5, temperature normal, no tachycardia, electrolytes okay, urinalysis  with trace amount of leukocytes, but no symptoms or UTI, negative troponin. EKG showed ectopic atrial rhythm. Vision is admitted to inpatient for further evaluation and treatment. Neurology was consulted.  Hospital Course:  Patient was admitted to the hospital with a syncopal episode, neurology was consulted and recommended an EEG, which was found to be normal. Her EKG on admission was found to have an ectopic atrial rhythm which on repeat EKG showed sinus rhythm. She underwent an 2D echo which was normal with EF of 60%, no WMA and without significant valvular disease. I discussed patient's EKG and case with Dr. Tamala Julian from cardiology, her ectopy is less likely to cause her syncopal episode and has no worrisome features in this young patient. Patient was discharged home in stable condition. Her tylenol sinus which contains pseudoephedrine was discontinued on d/c.  Procedures:  2D echo  Study Conclusions - Left ventricle: The cavity size was normal. Wall thickness wasnormal. The estimated ejection fraction was 60%. Wall motion wasnormal; there were no regional wall motion abnormalities. Right ventricle: The cavity size was normal. Systolic functionwas normal.   Consultations:  Neurology   Cardiology curbside  Discharge Exam: Filed Vitals:   03/22/15 2030 03/22/15 2122 03/23/15 0500 03/23/15 0534  BP: 115/78 112/78 113/68   Pulse: 91 86 68   Temp:  98.5 F (36.9 C) 98.1 F (36.7 C)   TempSrc:  Oral Oral   Resp: 21 20 18    Height:  5\' 1"  (1.549 m)    Weight:  79.924 kg (176 lb 3.2 oz)  80.1 kg (176 lb 9.4 oz)  SpO2: 98% 100% 93%    General: NAD Cardiovascular: RRR Respiratory: CTA biL  Discharge Instructions    Medication List    STOP taking these medications        pseudoephedrine-acetaminophen 30-500 MG Tabs  Commonly known as:  TYLENOL SINUS      TAKE these medications        amoxicillin 500 MG capsule  Commonly known as:  AMOXIL  Take 500 mg by mouth 3 (three)  times daily. Started 03/21/15, for 14 days ending 04/03/15     chlorhexidine 0.12 % solution  Commonly known as:  PERIDEX  Use as directed 5 mLs in the mouth or throat daily.     ibuprofen 600 MG tablet  Commonly known as:  ADVIL,MOTRIN  Take 600 mg by mouth 3 (three) times daily as needed for moderate pain.     promethazine 12.5 MG tablet  Commonly known as:  PHENERGAN  Take 1 tablet (12.5 mg total) by mouth every 6 (six) hours as needed for nausea or vomiting.     ranitidine 150 MG tablet  Commonly known as:  ZANTAC  Take 150 mg by mouth 2 (two) times daily as needed for heartburn.           Follow-up Information    Follow up with Gara Kroner, MD. Schedule an appointment as soon as possible for a visit in 2 weeks.   Specialty:  Family Medicine   Contact information:   [address] [place] [address] [phone]      The results of significant diagnostics from this hospitalization (including imaging, microbiology, ancillary and laboratory) are listed below for reference.    Significant Diagnostic Studies: Ct Head Wo Contrast  03/22/2015   CLINICAL DATA:  Syncopal episode at work today. Loss of consciousness. Initial encounter.  EXAM: CT HEAD WITHOUT CONTRAST  TECHNIQUE: Contiguous axial images were obtained from the base of the skull through the vertex without intravenous contrast.  COMPARISON:  None.  FINDINGS: There is no evidence of acute intracranial hemorrhage, mass lesion, brain edema or extra-axial fluid collection. The ventricles and subarachnoid spaces are appropriately sized for age. There is no CT evidence of acute cortical infarction.  There is near complete opacification of the left maxillary and ethmoid sinuses with a maxillary sinus air-fluid level. The left frontal sinus is hypoplastic. The walls of the left maxillary sinus appear thinned. There is a focal defect posterolaterally on image 1. No definite acute facial fractures identified. The  right-sided paranasal sinuses are clear. The mastoid air cells and middle ears are clear. The calvarium is intact.  IMPRESSION: 1. No acute intracranial findings. 2. Left maxillary and ethmoid sinus disease with irregularity of the walls of the left maxillary sinus, presumably secondary to chronic sinusitis or previous intervention. No definite acute osseous findings.   Electronically Signed   By: Richardean Sale M.D.   On: 03/22/2015 19:10   Labs: Basic Metabolic Panel:  Recent Labs Lab 03/22/15 1738 03/23/15 0327  NA 135 139  K 4.1 4.2  CL 100* 109  CO2 27 25  GLUCOSE 110* 111*  BUN 13 10  CREATININE 0.90 0.83  CALCIUM 8.7* 8.1*   Liver Function Tests:  Recent Labs Lab 03/22/15 1738  AST 16  ALT 10*  ALKPHOS 58  BILITOT 0.4  PROT 7.2  ALBUMIN 3.4*   CBC:  Recent Labs Lab 03/22/15 1738 03/23/15 0327  WBC 8.5 7.8  HGB 12.4 11.4*  HCT 38.0 35.2*  MCV 89.8 91.7  PLT 225 195   Cardiac Enzymes:  Recent Labs Lab 03/22/15 2236 03/23/15 0327 03/23/15 1116  CKTOTAL 56  --   --   TROPONINI <0.03 <0.03 <0.03   CBG:  Recent Labs Lab 03/22/15 1810 03/23/15 0539  GLUCAP 103* 89    Signed:  GHERGHE, COSTIN  [facility] 03/23/2015, 4:31 PM

## 2015-03-23 NOTE — Procedures (Signed)
ELECTROENCEPHALOGRAM REPORT  Date of Study: 03/23/2015  Patient's Name: Katherine Marshall MRN: [id number] Date of Birth: November 19, 1985  Referring Provider: Dr. Ivor Costa  Clinical History: This is a 29 year old woman with syncope.   Medications: alum & mag hydroxide-simeth (MAALOX/MYLANTA) 200-200-20 MG/5ML suspension 30 mL amoxicillin (AMOXIL) capsule 500 mg famotidine (PEPCID) tablet 20 mg ibuprofen (ADVIL,MOTRIN) tablet 600 mg pseudoephedrine (SUDAFED) tablet 30 mg  Technical Summary: A multichannel digital EEG recording measured by the international 10-20 system with electrodes applied with paste and impedances below 5000 ohms performed in our laboratory with EKG monitoring in an awake and asleep patient.  Hyperventilation was not performed. Photic stimulation was performed.  The digital EEG was referentially recorded, reformatted, and digitally filtered in a variety of bipolar and referential montages for optimal display.    Description: The patient is awake and asleep during the recording.  During maximal wakefulness, there is a symmetric, medium voltage 10 Hz posterior dominant rhythm that attenuates with eye opening.  The record is symmetric.  During drowsiness and sleep, there is an increase in theta slowing of the background.  Vertex waves and symmetric sleep spindles were seen.  Photic stimulation did not elicit any abnormalities.  There were no epileptiform discharges or electrographic seizures seen.    EKG lead showed frequent extrasystolic beats.  Impression: This awake and asleep EEG is normal.  Clinical Correlation: A normal EEG does not exclude a clinical diagnosis of epilepsy. Clinical correlation is advised. Note of EKG lead changes above.   Ellouise Newer, M.D.

## 2015-03-23 NOTE — Discharge Instructions (Signed)
Follow with Gara Kroner, MD in 1-2 weeks  Please get a complete blood count and chemistry panel checked by your Primary MD at your next visit, and again as instructed by your Primary MD. Please get your medications reviewed and adjusted by your Primary MD.  Please request your Primary MD to go over all Hospital Tests and Procedure/Radiological results at the follow up, please get all Hospital records sent to your Prim MD by signing hospital release before you go home.  If you had Pneumonia of Lung problems at the Hospital: Please get a 2 view Chest X ray done in 6-8 weeks after hospital discharge or sooner if instructed by your Primary MD.  If you have Congestive Heart Failure: Please call your Cardiologist or Primary MD anytime you have any of the following symptoms:  1) 3 pound weight gain in 24 hours or 5 pounds in 1 week  2) shortness of breath, with or without a dry hacking cough  3) swelling in the hands, feet or stomach  4) if you have to sleep on extra pillows at night in order to breathe  Follow cardiac low salt diet and 1.5 lit/day fluid restriction.  If you have diabetes Accuchecks 4 times/day, Once in AM empty stomach and then before each meal. Log in all results and show them to your primary doctor at your next visit. If any glucose reading is under 80 or above 300 call your primary MD immediately.  If you have Seizure/Convulsions/Epilepsy: Please do not drive, operate heavy machinery, participate in activities at heights or participate in high speed sports until you have seen by Primary MD or a Neurologist and advised to do so again.  If you had Gastrointestinal Bleeding: Please ask your Primary MD to check a complete blood count within one week of discharge or at your next visit. Your endoscopic/colonoscopic biopsies that are pending at the time of discharge, will also need to followed by your Primary MD.  Get Medicines reviewed and adjusted. Please take all your  medications with you for your next visit with your Primary MD  Please request your Primary MD to go over all hospital tests and procedure/radiological results at the follow up, please ask your Primary MD to get all Hospital records sent to his/her office.  If you experience worsening of your admission symptoms, develop shortness of breath, life threatening emergency, suicidal or homicidal thoughts you must seek medical attention immediately by calling 911 or calling your MD immediately  if symptoms less severe.  You must read complete instructions/literature along with all the possible adverse reactions/side effects for all the Medicines you take and that have been prescribed to you. Take any new Medicines after you have completely understood and accpet all the possible adverse reactions/side effects.   Do not drive or operate heavy machinery when taking Pain medications.   Do not take more than prescribed Pain, Sleep and Anxiety Medications  Special Instructions: If you have smoked or chewed Tobacco  in the last 2 yrs please stop smoking, stop any regular Alcohol  and or any Recreational drug use.  Wear Seat belts while driving.  Please note You were cared for by a hospitalist during your hospital stay. If you have any questions about your discharge medications or the care you received while you were in the hospital after you are discharged, you can call the unit and asked to speak with the hospitalist on call if the hospitalist that took care of you is not available. Once  you are discharged, your primary care physician will handle any further medical issues. Please note that NO REFILLS for any discharge medications will be authorized once you are discharged, as it is imperative that you return to your primary care physician (or establish a relationship with a primary care physician if you do not have one) for your aftercare needs so that they can reassess your need for medications and monitor your  lab values.  You can reach the hospitalist office at phone [phone] or fax [phone]   If you do not have a primary care physician, you can call [phone] for a physician referral.  Activity: As tolerated with Full fall precautions use walker/cane & assistance as needed  Diet: regular  Disposition Home

## 2015-03-23 NOTE — Progress Notes (Signed)
Patient educated with D/C handout. IV removed. Tele box removed. Patient's belongings given to mother. Patient wheeled to front entrance to D/C to home with mom.   Domingo Dimes RN

## 2015-03-23 NOTE — Progress Notes (Signed)
PT Cancellation and Discharge Note  Patient Details Name: Bronte Sabado MRN: [id number] DOB: [birth year]   Cancelled Treatment:    Reason Eval/Treat Not Completed: PT screened, no needs identified, will sign off   Terril Amaro F 03/23/2015, 2:44 PM

## 2015-03-24 LAB — T3, FREE: T3 FREE: 3 pg/mL (ref 2.0–4.4)

## 2015-03-24 LAB — URINE CULTURE
Colony Count: NO GROWTH
Culture: NO GROWTH

## 2015-10-21 NOTE — L&D Delivery Note (Signed)
Delivery Note At 8:58 PM a viable female was delivered via Vaginal, Spontaneous Delivery (Presentation: ;  ).  APGAR: , ; weight  .  pending Placenta status: , spontaneous, intace.  Cord:  with the following complications: none .  Cord pH: n/a  Anesthesia:  epidural Episiotomy: None Lacerations: 1st degree Suture Repair: 3.0 vicryl rapide single figure of 8 to close splayed space, hemostatic on own Est. Blood Loss (mL):  350  Mom to postpartum.  Baby to Couplet care / Skin to Skin.  Elody Kleinsasser A. 10/04/2016, 9:11 PM

## 2016-02-18 DIAGNOSIS — K589 Irritable bowel syndrome without diarrhea: Secondary | ICD-10-CM | POA: Insufficient documentation

## 2016-02-18 DIAGNOSIS — R519 Headache, unspecified: Secondary | ICD-10-CM | POA: Insufficient documentation

## 2016-02-18 DIAGNOSIS — N200 Calculus of kidney: Secondary | ICD-10-CM | POA: Insufficient documentation

## 2016-03-21 LAB — OB RESULTS CONSOLE HIV ANTIBODY (ROUTINE TESTING): HIV: NONREACTIVE

## 2016-03-21 LAB — OB RESULTS CONSOLE GC/CHLAMYDIA
Chlamydia: NEGATIVE
GC PROBE AMP, GENITAL: NEGATIVE

## 2016-06-03 ENCOUNTER — Inpatient Hospital Stay (HOSPITAL_COMMUNITY)
Admission: AD | Admit: 2016-06-03 | Discharge: 2016-06-03 | Disposition: A | Discharge status: Home / Self Care | Payer: BLUE CROSS/BLUE SHIELD | Source: Ambulatory Visit | Attending: Obstetrics and Gynecology | Admitting: Obstetrics and Gynecology

## 2016-06-03 ENCOUNTER — Encounter (HOSPITAL_COMMUNITY): Payer: Self-pay | Admitting: *Deleted

## 2016-06-03 DIAGNOSIS — Z7982 Long term (current) use of aspirin: Secondary | ICD-10-CM | POA: Diagnosis not present

## 2016-06-03 DIAGNOSIS — Z3A21 21 weeks gestation of pregnancy: Secondary | ICD-10-CM | POA: Insufficient documentation

## 2016-06-03 DIAGNOSIS — O4692 Antepartum hemorrhage, unspecified, second trimester: Secondary | ICD-10-CM | POA: Diagnosis not present

## 2016-06-03 DIAGNOSIS — O99612 Diseases of the digestive system complicating pregnancy, second trimester: Secondary | ICD-10-CM | POA: Diagnosis not present

## 2016-06-03 DIAGNOSIS — O4690 Antepartum hemorrhage, unspecified, unspecified trimester: Secondary | ICD-10-CM | POA: Diagnosis not present

## 2016-06-03 DIAGNOSIS — K219 Gastro-esophageal reflux disease without esophagitis: Secondary | ICD-10-CM | POA: Diagnosis not present

## 2016-06-03 LAB — URINALYSIS, ROUTINE W REFLEX MICROSCOPIC
Bilirubin Urine: NEGATIVE
GLUCOSE, UA: NEGATIVE mg/dL
Ketones, ur: NEGATIVE mg/dL
NITRITE: NEGATIVE
Protein, ur: NEGATIVE mg/dL
SPECIFIC GRAVITY, URINE: 1.015 (ref 1.005–1.030)
pH: 6 (ref 5.0–8.0)

## 2016-06-03 LAB — URINE MICROSCOPIC-ADD ON: RBC / HPF: NONE SEEN RBC/hpf (ref 0–5)

## 2016-06-03 LAB — WET PREP, GENITAL
CLUE CELLS WET PREP: NONE SEEN
SPERM: NONE SEEN
TRICH WET PREP: NONE SEEN
Yeast Wet Prep HPF POC: NONE SEEN

## 2016-06-03 NOTE — Discharge Instructions (Signed)
Vaginal Bleeding During Pregnancy, Second Trimester A small amount of bleeding (spotting) from the vagina is relatively common in pregnancy. It usually stops on its own. Various things can cause bleeding or spotting in pregnancy. Some bleeding may be related to the pregnancy, and some may not. Sometimes the bleeding is normal and is not a problem. However, bleeding can also be a sign of something serious. Be sure to tell your health care provider about any vaginal bleeding right away. Some possible causes of vaginal bleeding during the second trimester include:  Infection, inflammation, or growths on the cervix.   The placenta may be partially or completely covering the opening of the cervix inside the uterus (placenta previa).  The placenta may have separated from the uterus (abruption of the placenta).   You may be having early (preterm) labor.   The cervix may not be strong enough to keep a baby inside the uterus (cervical insufficiency).   Tiny cysts may have developed in the uterus instead of pregnancy tissue (molar pregnancy). HOME CARE INSTRUCTIONS  Watch your condition for any changes. The following actions may help to lessen any discomfort you are feeling:  Follow your health care provider's instructions for limiting your activity. If your health care provider orders bed rest, you may need to stay in bed and only get up to use the bathroom. However, your health care provider may allow you to continue light activity.  If needed, make plans for someone to help with your regular activities and responsibilities while you are on bed rest.  Keep track of the number of pads you use each day, how often you change pads, and how soaked (saturated) they are. Write this down.  Do not use tampons. Do not douche.  Do not have sexual intercourse or orgasms until approved by your health care provider.  If you pass any tissue from your vagina, save the tissue so you can show it to your  health care provider.  Only take over-the-counter or prescription medicines as directed by your health care provider.  Do not take aspirin because it can make you bleed.  Do not exercise or perform any strenuous activities or heavy lifting without your health care provider's permission.  Keep all follow-up appointments as directed by your health care provider. SEEK MEDICAL CARE IF:  You have any vaginal bleeding during any part of your pregnancy.  You have cramps or labor pains.  You have a fever, not controlled by medicine. SEEK IMMEDIATE MEDICAL CARE IF:   You have severe cramps in your back or belly (abdomen).  You have contractions.  You have chills.  You pass large clots or tissue from your vagina.  Your bleeding increases.  You feel light-headed or weak, or you have fainting episodes.  You are leaking fluid or have a gush of fluid from your vagina. MAKE SURE YOU:  Understand these instructions.  Will watch your condition.  Will get help right away if you are not doing well or get worse.   This information is not intended to replace advice given to you by your health care provider. Make sure you discuss any questions you have with your health care provider.   Document Released: 07/16/2005 Document Revised: 10/11/2013 Document Reviewed: 06/13/2013 Elsevier Interactive Patient Education 2016 [employer]  

## 2016-06-03 NOTE — MAU Provider Note (Signed)
MAU HISTORY AND PHYSICAL  Chief Complaint:  Vaginal Bleeding   Katherine Marshall is a 30 y.o.  G2P1001  at [redacted]w[redacted]d presenting for Vaginal Bleeding  Mucous discharge mixed with dark blood for 24 hours. No bleeding this pregnancy. Normal anatomy scan last week. No dysuria. No fever. No vaginal pain. No contractions or abdominal/pelvic pain. No sex last 1 hours or anything else in vagina. No hx cervical/vaginal procedures.    Past Medical History:  Diagnosis Date  . GERD (gastroesophageal reflux disease)   . IBS (irritable bowel syndrome)   . Kidney stones   . Migraines    as a child    Past Surgical History:  Procedure Laterality Date  . BASAL CELL CARCINOMA EXCISION    . DENTAL SURGERY      Family History  Problem Relation Age of Onset  . Cancer Father     thyroid  . Hypertension Father   . Cancer Paternal Grandmother     colon  . Hypertension Paternal Grandmother   . Hypertension Mother   . Hypertension Brother   . Hypertension Maternal Grandmother   . Heart attack Maternal Grandmother   . Hypertension Maternal Grandfather   . Cancer Paternal Grandfather     brain  . Hypertension Paternal Grandfather     Social History  Substance Use Topics  . Smoking status: Never Smoker  . Smokeless tobacco: Never Used  . Alcohol use 1.5 - 2.0 oz/week    3 - 4 Standard drinks or equivalent per week     Comment: occ glass of wine    No Known Allergies  Prescriptions Prior to Admission  Medication Sig Dispense Refill Last Dose  . acetaminophen (TYLENOL) 500 MG tablet Take 1,000 mg by mouth every 6 (six) hours as needed for moderate pain.   Past Week at Unknown time  . aspirin 81 MG tablet Take 81 mg by mouth every evening.   06/02/2016 at Unknown time  . Prenatal Vit-Fe Fumarate-FA (PRENATAL MULTIVITAMIN) TABS tablet Take 1 tablet by mouth daily at 12 noon.   06/02/2016 at Unknown time    Review of Systems - Negative except for what is mentioned in HPI.  Physical Exam  Blood  pressure 115/88, pulse 97, temperature 98.2 F (36.8 C), temperature source Oral, resp. rate 16, last menstrual period 01/05/2016, unknown if currently breastfeeding. GENERAL: Well-developed, well-nourished female in no acute distress.  LUNGS: Clear to auscultation bilaterally.  HEART: Regular rate and rhythm. ABDOMEN: Soft, nontender, nondistended, gravid.  EXTREMITIES: Nontender, no edema, 2+ distal pulses. GU: small amount blood tinged mucous at os. Closed/thick/high. Normal cervix.  Labs: Results for orders placed or performed during the hospital encounter of 06/03/16 (from the past 24 hour(s))  Urinalysis, Routine w reflex microscopic (not at [facility])   Collection Time: 06/03/16  3:35 PM  Result Value Ref Range   Color, Urine YELLOW YELLOW   APPearance HAZY (A) CLEAR   Specific Gravity, Urine 1.015 1.005 - 1.030   pH 6.0 5.0 - 8.0   Glucose, UA NEGATIVE NEGATIVE mg/dL   Hgb urine dipstick MODERATE (A) NEGATIVE   Bilirubin Urine NEGATIVE NEGATIVE   Ketones, ur NEGATIVE NEGATIVE mg/dL   Protein, ur NEGATIVE NEGATIVE mg/dL   Nitrite NEGATIVE NEGATIVE   Leukocytes, UA TRACE (A) NEGATIVE  Urine microscopic-add on   Collection Time: 06/03/16  3:35 PM  Result Value Ref Range   Squamous Epithelial / LPF 6-30 (A) NONE SEEN   WBC, UA 0-5 0 - 5 WBC/hpf  RBC / HPF NONE SEEN 0 - 5 RBC/hpf   Bacteria, UA MANY (A) NONE SEEN  Wet prep, genital   Collection Time: 06/03/16  4:06 PM  Result Value Ref Range   Yeast Wet Prep HPF POC NONE SEEN NONE SEEN   Trich, Wet Prep NONE SEEN NONE SEEN   Clue Cells Wet Prep HPF POC NONE SEEN NONE SEEN   WBC, Wet Prep HPF POC MODERATE (A) NONE SEEN   Sperm NONE SEEN     Imaging Studies:  No results found.  Assessment: Katherine Marshall is  30 y.o. G2P1001 at [redacted]w[redacted]d presents with vaginal bleeding in pregnancy. Small amount. No pain, normal fetal heart rate. Wet prep unremarkable. Cervix closed and thick. Discussed w/ Dr. Ronita Hipps. Rh positive.  Plan: -  abruption/bleeding return precautions - f/u g/c - pelvic rest - ob f/u one week  Desma Maxim 8/15/20174:22 PM

## 2016-06-03 NOTE — MAU Note (Signed)
Pt states that over the past 24 hour she has had some bloody, mucus like discharge.  Pt states that this morning she had a bloody tissue like clot.  Pt states nothing is collecting on her underwear it is just when she wipes.  Pt states it is brownish pinkish colored.

## 2016-06-04 LAB — GC/CHLAMYDIA PROBE AMP (~~LOC~~) NOT AT ARMC
Chlamydia: NEGATIVE
NEISSERIA GONORRHEA: NEGATIVE

## 2016-07-18 ENCOUNTER — Encounter (HOSPITAL_COMMUNITY): Payer: Self-pay | Admitting: *Deleted

## 2016-07-18 ENCOUNTER — Inpatient Hospital Stay (HOSPITAL_COMMUNITY): Payer: BLUE CROSS/BLUE SHIELD

## 2016-07-18 ENCOUNTER — Inpatient Hospital Stay (HOSPITAL_COMMUNITY)
Admission: AD | Admit: 2016-07-18 | Discharge: 2016-07-18 | Disposition: A | Discharge status: Home / Self Care | Payer: BLUE CROSS/BLUE SHIELD | Source: Ambulatory Visit | Attending: Obstetrics & Gynecology | Admitting: Obstetrics & Gynecology

## 2016-07-18 DIAGNOSIS — Z3A27 27 weeks gestation of pregnancy: Secondary | ICD-10-CM

## 2016-07-18 DIAGNOSIS — Z7982 Long term (current) use of aspirin: Secondary | ICD-10-CM | POA: Diagnosis not present

## 2016-07-18 DIAGNOSIS — O4692 Antepartum hemorrhage, unspecified, second trimester: Secondary | ICD-10-CM | POA: Diagnosis not present

## 2016-07-18 HISTORY — DX: Essential (primary) hypertension: I10

## 2016-07-18 LAB — COMPREHENSIVE METABOLIC PANEL
ALK PHOS: 63 U/L (ref 38–126)
ALT: 14 U/L (ref 14–54)
ANION GAP: 8 (ref 5–15)
AST: 14 U/L — ABNORMAL LOW (ref 15–41)
Albumin: 3.3 g/dL — ABNORMAL LOW (ref 3.5–5.0)
BUN: 13 mg/dL (ref 6–20)
CALCIUM: 8.8 mg/dL — AB (ref 8.9–10.3)
CO2: 23 mmol/L (ref 22–32)
CREATININE: 0.77 mg/dL (ref 0.44–1.00)
Chloride: 104 mmol/L (ref 101–111)
GFR calc non Af Amer: >60 mL/min (ref >60)
Glucose, Bld: 89 mg/dL (ref 65–99)
Potassium: 4 mmol/L (ref 3.5–5.1)
SODIUM: 135 mmol/L (ref 135–145)
Total Bilirubin: 0.2 mg/dL — ABNORMAL LOW (ref 0.3–1.2)
Total Protein: 7.6 g/dL (ref 6.5–8.1)

## 2016-07-18 LAB — CBC
HCT: 36 % (ref 36.0–46.0)
HEMOGLOBIN: 12.3 g/dL (ref 12.0–15.0)
MCH: 30.2 pg (ref 26.0–34.0)
MCHC: 34.2 g/dL (ref 30.0–36.0)
MCV: 88.5 fL (ref 78.0–100.0)
PLATELETS: 173 10*3/uL (ref 150–400)
RBC: 4.07 MIL/uL (ref 3.87–5.11)
RDW: 13.2 % (ref 11.5–15.5)
WBC: 11.8 10*3/uL — ABNORMAL HIGH (ref 4.0–10.5)

## 2016-07-18 LAB — URINE MICROSCOPIC-ADD ON

## 2016-07-18 LAB — URINALYSIS, ROUTINE W REFLEX MICROSCOPIC
BILIRUBIN URINE: NEGATIVE
Glucose, UA: NEGATIVE mg/dL
KETONES UR: NEGATIVE mg/dL
Nitrite: NEGATIVE
Protein, ur: 30 mg/dL — AB
Specific Gravity, Urine: 1.01 (ref 1.005–1.030)
pH: 7 (ref 5.0–8.0)

## 2016-07-18 LAB — PROTEIN / CREATININE RATIO, URINE
CREATININE, URINE: 59 mg/dL
Protein Creatinine Ratio: 0.71 mg/mg{Cre} — ABNORMAL HIGH (ref 0.00–0.15)
TOTAL PROTEIN, URINE: 42 mg/dL

## 2016-07-18 LAB — FETAL FIBRONECTIN: Fetal Fibronectin: POSITIVE — AB

## 2016-07-18 NOTE — Discharge Instructions (Signed)
Pelvic Rest Pelvic rest is sometimes recommended for women when:   The placenta is partially or completely covering the opening of the cervix (placenta previa).  There is bleeding between the uterine wall and the amniotic sac in the first trimester (subchorionic hemorrhage).  The cervix begins to open without labor starting (incompetent cervix, cervical insufficiency).  The labor is too early (preterm labor). HOME CARE INSTRUCTIONS  Do not have sexual intercourse, stimulation, or an orgasm.  Do not use tampons, douche, or put anything in the vagina.  Do not lift anything over 10 pounds (4.5 kg).  Avoid strenuous activity or straining your pelvic muscles. SEEK MEDICAL CARE IF:  You have any vaginal bleeding during pregnancy. Treat this as a potential emergency.  You have cramping pain felt low in the stomach (stronger than menstrual cramps).  You notice vaginal discharge (watery, mucus, or bloody).  You have a low, dull backache.  There are regular contractions or uterine tightening. SEEK IMMEDIATE MEDICAL CARE IF: You have vaginal bleeding and have placenta previa.    This information is not intended to replace advice given to you by your health care provider. Make sure you discuss any questions you have with your health care provider.   Document Released: 01/31/2011 Document Revised: 12/29/2011 Document Reviewed: 04/09/2015 Elsevier Interactive Patient Education 2016 Elsevier Inc[employer]   Vaginal Bleeding During Pregnancy, Second Trimester  A small amount of bleeding (spotting) from the vagina is common in pregnancy. Sometimes the bleeding is normal and is not a problem, and sometimes it is a sign of something serious. Be sure to tell your doctor about any bleeding from your vagina right away. HOME CARE  Watch your condition for any changes.  Follow your doctor's instructions about how active you can be.  If you are on bed rest:  You may need to stay in bed and only get  up to use the bathroom.  You may be allowed to do some activities.  If you need help, make plans for someone to help you.  Write down:  The number of pads you use each day.  How often you change pads.  How soaked (saturated) your pads are.  Do not use tampons.  Do not douche.  Do not have sex or orgasms until your doctor says it is okay.  If you pass any tissue from your vagina, save the tissue so you can show it to your doctor.  Only take medicines as told by your doctor.  Do not take aspirin because it can make you bleed.  Do not exercise, lift heavy weights, or do any activities that take a lot of energy and effort unless your doctor says it is okay.  Keep all follow-up visits as told by your doctor. GET HELP IF:   You bleed from your vagina.  You have cramps.  You have labor pains.  You have a fever that does not go away after you take medicine. GET HELP RIGHT AWAY IF:  You have very bad cramps in your back or belly (abdomen).  You have contractions.  You have chills.  You pass large clots or tissue from your vagina.  You bleed more.  You feel light-headed or weak.  You pass out (faint).  You are leaking fluid or have a gush of fluid from your vagina. MAKE SURE YOU:  Understand these instructions.  Will watch your condition.  Will get help right away if you are not doing well or get worse.   This information  is not intended to replace advice given to you by your health care provider. Make sure you discuss any questions you have with your health care provider.   Document Released: 02/20/2014 Document Reviewed: 02/20/2014 Elsevier Interactive Patient Education [employer].

## 2016-07-18 NOTE — MAU Note (Signed)
Pt presents to MAU with complaints of lower abdominal and back pain since yesterday. Reports small amount of vaginal spotting.

## 2016-07-18 NOTE — MAU Provider Note (Signed)
History     CSN: [id number]  Arrival date and time: 07/18/16 1703   First Provider Initiated Contact with Patient 07/18/16 1818      Chief Complaint  Patient presents with  . Vaginal Bleeding  . Abdominal Cramping   HPI  Katherine Marshall is a 30 y.o. G2P1001 at [redacted]w[redacted]d who presents with vaginal bleeding. Reports intermittent abdominal cramping throughout the day yesterday. Pain resolved after going to bed and denies any abdominal pain today. This evening noticed dark red blood in underwear x 1 episode. Denies recent intercourse, abdominal trauma, straining for BM, LOF, or vomiting. Positive fetal movement.  History of preeclampsia with previous pregnancy. Denies headache, vision changes, epigastric pain.   OB History    Gravida Para Term Preterm AB Living   2 1 1  0 0 1   SAB TAB Ectopic Multiple Live Births   0 0 0 0 1      Past Medical History:  Diagnosis Date  . GERD (gastroesophageal reflux disease)   . Hypertension    PIH with last preg  . IBS (irritable bowel syndrome)   . Kidney stones   . Migraines    as a child    Past Surgical History:  Procedure Laterality Date  . BASAL CELL CARCINOMA EXCISION    . DENTAL SURGERY      Family History  Problem Relation Age of Onset  . Cancer Father     thyroid  . Hypertension Father   . Cancer Paternal Grandmother     colon  . Hypertension Paternal Grandmother   . Hypertension Mother   . Hypertension Brother   . Hypertension Maternal Grandmother   . Heart attack Maternal Grandmother   . Hypertension Maternal Grandfather   . Cancer Paternal Grandfather     brain  . Hypertension Paternal Grandfather     Social History  Substance Use Topics  . Smoking status: Never Smoker  . Smokeless tobacco: Never Used  . Alcohol use 1.5 - 2.0 oz/week    3 - 4 Standard drinks or equivalent per week     Comment: occ glass of wine    Allergies: No Known Allergies  Prescriptions Prior to Admission  Medication Sig Dispense Refill  Last Dose  . acetaminophen (TYLENOL) 500 MG tablet Take 1,000 mg by mouth every 6 (six) hours as needed for moderate pain.   Past Week at Unknown time  . aspirin 81 MG tablet Take 81 mg by mouth every evening.   06/02/2016 at Unknown time  . Prenatal Vit-Fe Fumarate-FA (PRENATAL MULTIVITAMIN) TABS tablet Take 1 tablet by mouth daily at 12 noon.   06/02/2016 at Unknown time    Review of Systems  Constitutional: Negative.   Eyes: Negative for blurred vision.  Gastrointestinal: Negative.   Genitourinary: Negative for dysuria, flank pain and frequency.       + vaginal bleeding  Neurological: Positive for headaches.   Physical Exam   Blood pressure 120/81, pulse 97, temperature 98.3 F (36.8 C), temperature source Oral, resp. rate 16, weight 203 lb (92.1 kg), last menstrual period 01/05/2016, unknown if currently breastfeeding.  Physical Exam  Nursing note and vitals reviewed. Constitutional: She is oriented to person, place, and time. She appears well-developed and well-nourished. No distress.  HENT:  Head: Normocephalic and atraumatic.  Eyes: Conjunctivae are normal. Right eye exhibits no discharge. Left eye exhibits no discharge. No scleral icterus.  Neck: Normal range of motion.  Cardiovascular: Normal rate, regular rhythm and normal heart sounds.  No murmur heard. Respiratory: Effort normal and breath sounds normal. No respiratory distress. She has no wheezes.  GI: Soft. There is no tenderness.  No contractions palpated  Genitourinary:  Genitourinary Comments: Small amount of dark red blood at cervical os -- cleared out with 1 fox swab  Neurological: She is alert and oriented to person, place, and time.  Skin: Skin is warm and dry. She is not diaphoretic.  Psychiatric: She has a normal mood and affect. Her behavior is normal. Judgment and thought content normal.   Dilation: 1 Effacement (%): Thick Exam by:: Jorje Guild, NP  Fetal Tracing:  Baseline: 135 Variability:  moderate Accelerations: + Decelerations: -  Toco: none   MAU Course  Procedures Results for orders placed or performed during the hospital encounter of 07/18/16 (from the past 24 hour(s))  Urinalysis, Routine w reflex microscopic (not at [facility])     Status: Abnormal   Collection Time: 07/18/16  5:25 PM  Result Value Ref Range   Color, Urine RED (A) YELLOW   APPearance HAZY (A) CLEAR   Specific Gravity, Urine 1.010 1.005 - 1.030   pH 7.0 5.0 - 8.0   Glucose, UA NEGATIVE NEGATIVE mg/dL   Hgb urine dipstick LARGE (A) NEGATIVE   Bilirubin Urine NEGATIVE NEGATIVE   Ketones, ur NEGATIVE NEGATIVE mg/dL   Protein, ur 30 (A) NEGATIVE mg/dL   Nitrite NEGATIVE NEGATIVE   Leukocytes, UA SMALL (A) NEGATIVE  Protein / creatinine ratio, urine     Status: Abnormal   Collection Time: 07/18/16  5:25 PM  Result Value Ref Range   Creatinine, Urine 59.00 mg/dL   Total Protein, Urine 42 mg/dL   Protein Creatinine Ratio 0.71 (H) 0.00 - 0.15 mg/mg[Cre]  Urine microscopic-add on     Status: Abnormal   Collection Time: 07/18/16  5:25 PM  Result Value Ref Range   Squamous Epithelial / LPF 6-30 (A) NONE SEEN   WBC, UA 6-30 0 - 5 WBC/hpf   RBC / HPF 0-5 0 - 5 RBC/hpf   Bacteria, UA MANY (A) NONE SEEN  CBC     Status: Abnormal   Collection Time: 07/18/16  5:30 PM  Result Value Ref Range   WBC 11.8 (H) 4.0 - 10.5 K/uL   RBC 4.07 3.87 - 5.11 MIL/uL   Hemoglobin 12.3 12.0 - 15.0 g/dL   HCT 36.0 36.0 - 46.0 %   MCV 88.5 78.0 - 100.0 fL   MCH 30.2 26.0 - 34.0 pg   MCHC 34.2 30.0 - 36.0 g/dL   RDW 13.2 11.5 - 15.5 %   Platelets 173 150 - 400 K/uL  Comprehensive metabolic panel     Status: Abnormal   Collection Time: 07/18/16  5:30 PM  Result Value Ref Range   Sodium 135 135 - 145 mmol/L   Potassium 4.0 3.5 - 5.1 mmol/L   Chloride 104 101 - 111 mmol/L   CO2 23 22 - 32 mmol/L   Glucose, Bld 89 65 - 99 mg/dL   BUN 13 6 - 20 mg/dL   Creatinine, Ser 0.77 0.44 - 1.00 mg/dL   Calcium 8.8 (L) 8.9 - 10.3  mg/dL   Total Protein 7.6 6.5 - 8.1 g/dL   Albumin 3.3 (L) 3.5 - 5.0 g/dL   AST 14 (L) 15 - 41 U/L   ALT 14 14 - 54 U/L   Alkaline Phosphatase 63 38 - 126 U/L   Total Bilirubin 0.2 (L) 0.3 - 1.2 mg/dL   GFR calc non Af  Amer >60 >60 mL/min   GFR calc Af Amer >60 >60 mL/min   Anion gap 8 5 - 15  Fetal fibronectin     Status: Abnormal   Collection Time: 07/18/16  6:40 PM  Result Value Ref Range   Fetal Fibronectin POSITIVE (A) NEGATIVE    MDM Category 1 fetal tracing, no contractions appreciated on toco Elevated urine PCR likely d/t urine contaminated with blood; rest of PIH labs normal 1 elevated BP, not severe range, normotensive for rest of mau visit Ultrasound - anterior placenta above cervical os, no evidence of abruption, cervical length 3.9 cm FFN positive -- results possibly skewed d/t vaginal bleeding S/w Dr. Benjie Karvonen regarding presentation & results -- ok to discharge home on pelvic rest to f/u with Dr. Benjie Karvonen next week as scheduled  Assessment and Plan  A: 1. Vaginal bleeding in pregnancy, second trimester   2. [redacted] weeks gestation of pregnancy    P: Discharge home Pelvic rest Preterm labor & bleeding precautions Keep f/u with OB or call as needed OB urine culture pending  Jorje Guild 07/18/2016, 6:18 PM

## 2016-07-20 LAB — CULTURE, OB URINE

## 2016-07-24 LAB — OB RESULTS CONSOLE RPR: RPR: NONREACTIVE

## 2016-09-15 LAB — OB RESULTS CONSOLE GBS: GBS: NEGATIVE

## 2016-09-21 ENCOUNTER — Encounter (HOSPITAL_COMMUNITY): Payer: Self-pay

## 2016-09-21 ENCOUNTER — Inpatient Hospital Stay (HOSPITAL_COMMUNITY)
Admission: AD | Admit: 2016-09-21 | Discharge: 2016-09-21 | Disposition: A | Discharge status: Home / Self Care | Payer: Managed Care, Other (non HMO) | Source: Ambulatory Visit | Attending: Obstetrics & Gynecology | Admitting: Obstetrics & Gynecology

## 2016-09-21 DIAGNOSIS — O139 Gestational [pregnancy-induced] hypertension without significant proteinuria, unspecified trimester: Secondary | ICD-10-CM | POA: Diagnosis not present

## 2016-09-21 DIAGNOSIS — O133 Gestational [pregnancy-induced] hypertension without significant proteinuria, third trimester: Secondary | ICD-10-CM

## 2016-09-21 DIAGNOSIS — Z3A37 37 weeks gestation of pregnancy: Secondary | ICD-10-CM | POA: Diagnosis not present

## 2016-09-21 LAB — COMPREHENSIVE METABOLIC PANEL
ALK PHOS: 127 U/L — AB (ref 38–126)
ALT: 11 U/L — AB (ref 14–54)
AST: 14 U/L — AB (ref 15–41)
Albumin: 2.8 g/dL — ABNORMAL LOW (ref 3.5–5.0)
Anion gap: 8 (ref 5–15)
BUN: 12 mg/dL (ref 6–20)
CO2: 21 mmol/L — ABNORMAL LOW (ref 22–32)
CREATININE: 0.65 mg/dL (ref 0.44–1.00)
Calcium: 8.6 mg/dL — ABNORMAL LOW (ref 8.9–10.3)
Chloride: 106 mmol/L (ref 101–111)
GFR calc Af Amer: >60 mL/min (ref >60)
Glucose, Bld: 92 mg/dL (ref 65–99)
Potassium: 4 mmol/L (ref 3.5–5.1)
Sodium: 135 mmol/L (ref 135–145)
TOTAL PROTEIN: 7 g/dL (ref 6.5–8.1)
Total Bilirubin: 0.1 mg/dL — ABNORMAL LOW (ref 0.3–1.2)

## 2016-09-21 LAB — URINE MICROSCOPIC-ADD ON: RBC / HPF: NONE SEEN RBC/hpf (ref 0–5)

## 2016-09-21 LAB — CBC
HEMATOCRIT: 34.8 % — AB (ref 36.0–46.0)
Hemoglobin: 11.6 g/dL — ABNORMAL LOW (ref 12.0–15.0)
MCH: 28.6 pg (ref 26.0–34.0)
MCHC: 33.3 g/dL (ref 30.0–36.0)
MCV: 85.9 fL (ref 78.0–100.0)
Platelets: 184 10*3/uL (ref 150–400)
RBC: 4.05 MIL/uL (ref 3.87–5.11)
RDW: 13.4 % (ref 11.5–15.5)
WBC: 10 10*3/uL (ref 4.0–10.5)

## 2016-09-21 LAB — URINALYSIS, ROUTINE W REFLEX MICROSCOPIC
BILIRUBIN URINE: NEGATIVE
Glucose, UA: NEGATIVE mg/dL
HGB URINE DIPSTICK: NEGATIVE
Ketones, ur: NEGATIVE mg/dL
Nitrite: NEGATIVE
PROTEIN: NEGATIVE mg/dL
Specific Gravity, Urine: 1.025 (ref 1.005–1.030)
pH: 6.5 (ref 5.0–8.0)

## 2016-09-21 LAB — PROTEIN / CREATININE RATIO, URINE
CREATININE, URINE: 180 mg/dL
Protein Creatinine Ratio: 0.09 mg/mg{Cre} (ref 0.00–0.15)
Total Protein, Urine: 16 mg/dL

## 2016-09-21 NOTE — MAU Note (Signed)
Pt presents to MAU with increased BP, headache, and visual disturbances. Hx of PIH with last pregnancy. Denies bleeding or leaking of fluid.  Reports good fetal movement.

## 2016-09-21 NOTE — Discharge Instructions (Signed)

## 2016-09-21 NOTE — MAU Provider Note (Signed)
Chief Complaint:  Hypertension   First Provider Initiated Contact with Patient 09/21/16 1631     HPI: Katherine Marshall is a 30 y.o. G2P1001 at [redacted]w[redacted]d who presents to maternity admissions reporting elevated BP at home today, HA and vision changes. HA did not resolve w/ Tylenol 500 mg this afternoon. Hx Pre-E w/ last pregnancy. Concerns that she may have Pre-E. No elevated BP's in office per [facility]. Hx of HA's, but none this pregnancy.   Location: Generalized HA Quality: dull Severity: mild-moderate Duration: < 1 day Context: [redacted] weeks gestation Timing: constant Modifying factors: Did not completely resolve w/ Tylenol 500 mg Associated signs and symptoms: Pos for vision changes and elevated BP at home. Neg for fever, chills, difficulties w/ speech or gait, epigastric pain, neck pain or stiffness. No sudden onset of HA.   Denies contractions, leakage of fluid or vaginal bleeding. Good fetal movement.   Past Medical History:  Diagnosis Date  . GERD (gastroesophageal reflux disease)   . Hypertension    PIH with last preg  . IBS (irritable bowel syndrome)   . Kidney stones   . Migraines    as a child   OB History  Gravida Para Term Preterm AB Living  2 1 1  0 0 1  SAB TAB Ectopic Multiple Live Births  0 0 0 0 1    # Outcome Date GA Lbr Len/2nd Weight Sex Delivery Anes PTL Lv  2 Current           1 Term 04/14/14 [redacted]w[redacted]d -13:15 / 02:10 5 lb 5 oz (2.41 kg) M Vag-Spont EPI  LIV     Past Surgical History:  Procedure Laterality Date  . BASAL CELL CARCINOMA EXCISION    . DENTAL SURGERY     Family History  Problem Relation Age of Onset  . Cancer Father     thyroid  . Hypertension Father   . Cancer Paternal Grandmother     colon  . Hypertension Paternal Grandmother   . Hypertension Mother   . Hypertension Brother   . Hypertension Maternal Grandmother   . Heart attack Maternal Grandmother   . Hypertension Maternal Grandfather   . Cancer Paternal Grandfather     brain  . Hypertension  Paternal Grandfather    Social History  Substance Use Topics  . Smoking status: Never Smoker  . Smokeless tobacco: Never Used  . Alcohol use 1.5 - 2.0 oz/week    3 - 4 Standard drinks or equivalent per week     Comment: occ glass of wine- not during pregnancy   No Known Allergies Prescriptions Prior to Admission  Medication Sig Dispense Refill Last Dose  . acetaminophen (TYLENOL) 500 MG tablet Take 1,000 mg by mouth every 6 (six) hours as needed for mild pain, moderate pain or headache.    09/21/2016 at 1400  . aspirin 81 MG chewable tablet Chew 81 mg by mouth every evening.   09/20/2016 at 2000  . lansoprazole (PREVACID) 30 MG capsule Take 30 mg by mouth every evening.   09/20/2016 at Unknown time  . Prenatal MV-Min-FA-Omega-3 (PRENATAL GUMMIES/DHA & FA) 0.4-32.5 MG CHEW Chew 2 each by mouth every evening.   09/20/2016 at Unknown time    I have reviewed patient's Past Medical Hx, Surgical Hx, Family Hx, Social Hx, medications and allergies.   ROS:  Review of Systems  Constitutional: Negative for chills and fever.  HENT: Negative for congestion and sinus pain.   Eyes: Positive for visual disturbance. Negative for photophobia.  Gastrointestinal: Negative for abdominal pain.  Genitourinary: Negative for vaginal bleeding.  Musculoskeletal: Negative for neck pain and neck stiffness.  Neurological: Positive for headaches. Negative for dizziness, syncope and speech difficulty.    Physical Exam  Patient Vitals for the past 24 hrs:  BP Temp Temp src Pulse Resp  09/21/16 1717 99/69 - - (!) 129 -  09/21/16 1702 110/74 - - 89 -  09/21/16 1647 111/77 - - 91 -  09/21/16 1632 119/81 - - 90 -  09/21/16 1617 127/92 - - 97 -  09/21/16 1547 128/74 - - 87 -  09/21/16 1546 124/82 - - 91 -  09/21/16 1538 130/84 98 F (36.7 C) Oral 89 18   Constitutional: Well-developed, well-nourished female in no acute distress.  Cardiovascular: normal rate Respiratory: normal effort GI: Abd soft, non-tender,  gravid appropriate for gestational age.  MS: Extremities nontender, tr edema, normal ROM Neurologic: Alert and oriented x 4. DTRs 2+, no clonus GU: Deferred    FHT:  Baseline 130 , moderate variability, accelerations present, no decelerations Contractions: None   Labs: Results for orders placed or performed during the hospital encounter of 09/21/16 (from the past 24 hour(s))  Urinalysis, Routine w reflex microscopic (not at [facility])     Status: Abnormal   Collection Time: 09/21/16  3:32 PM  Result Value Ref Range   Color, Urine YELLOW YELLOW   APPearance CLEAR CLEAR   Specific Gravity, Urine 1.025 1.005 - 1.030   pH 6.5 5.0 - 8.0   Glucose, UA NEGATIVE NEGATIVE mg/dL   Hgb urine dipstick NEGATIVE NEGATIVE   Bilirubin Urine NEGATIVE NEGATIVE   Ketones, ur NEGATIVE NEGATIVE mg/dL   Protein, ur NEGATIVE NEGATIVE mg/dL   Nitrite NEGATIVE NEGATIVE   Leukocytes, UA SMALL (A) NEGATIVE  Protein / creatinine ratio, urine     Status: None   Collection Time: 09/21/16  3:32 PM  Result Value Ref Range   Creatinine, Urine 180.00 mg/dL   Total Protein, Urine 16 mg/dL   Protein Creatinine Ratio 0.09 0.00 - 0.15 mg/mg[Cre]  Urine microscopic-add on     Status: Abnormal   Collection Time: 09/21/16  3:32 PM  Result Value Ref Range   Squamous Epithelial / LPF 6-30 (A) NONE SEEN   WBC, UA 0-5 0 - 5 WBC/hpf   RBC / HPF NONE SEEN 0 - 5 RBC/hpf   Bacteria, UA MANY (A) NONE SEEN   Crystals CA OXALATE CRYSTALS (A) NEGATIVE   Urine-Other MUCOUS PRESENT   CBC     Status: Abnormal   Collection Time: 09/21/16  4:23 PM  Result Value Ref Range   WBC 10.0 4.0 - 10.5 K/uL   RBC 4.05 3.87 - 5.11 MIL/uL   Hemoglobin 11.6 (L) 12.0 - 15.0 g/dL   HCT 34.8 (L) 36.0 - 46.0 %   MCV 85.9 78.0 - 100.0 fL   MCH 28.6 26.0 - 34.0 pg   MCHC 33.3 30.0 - 36.0 g/dL   RDW 13.4 11.5 - 15.5 %   Platelets 184 150 - 400 K/uL  Comprehensive metabolic panel     Status: Abnormal   Collection Time: 09/21/16  4:23 PM  Result  Value Ref Range   Sodium 135 135 - 145 mmol/L   Potassium 4.0 3.5 - 5.1 mmol/L   Chloride 106 101 - 111 mmol/L   CO2 21 (L) 22 - 32 mmol/L   Glucose, Bld 92 65 - 99 mg/dL   BUN 12 6 - 20 mg/dL   Creatinine, Ser  0.65 0.44 - 1.00 mg/dL   Calcium 8.6 (L) 8.9 - 10.3 mg/dL   Total Protein 7.0 6.5 - 8.1 g/dL   Albumin 2.8 (L) 3.5 - 5.0 g/dL   AST 14 (L) 15 - 41 U/L   ALT 11 (L) 14 - 54 U/L   Alkaline Phosphatase 127 (H) 38 - 126 U/L   Total Bilirubin 0.1 (L) 0.3 - 1.2 mg/dL   GFR calc non Af Amer >60 >60 mL/min   GFR calc Af Amer >60 >60 mL/min   Anion gap 8 5 - 15    Imaging:  No results found.  MAU Course: Orders Placed This Encounter  Procedures  . CBC  . Comprehensive metabolic panel  . Urinalysis, Routine w reflex microscopic (not at [facility])  . Protein / creatinine ratio, urine  . Urine microscopic-add on  . Discharge patient   Meds ordered this encounter  Medications  . aspirin 81 MG chewable tablet    Sig: Chew 81 mg by mouth every evening.  . Prenatal MV-Min-FA-Omega-3 (PRENATAL GUMMIES/DHA & FA) 0.4-32.5 MG CHEW    Sig: Chew 2 each by mouth every evening.  . lansoprazole (PREVACID) 30 MG capsule    Sig: Take 30 mg by mouth every evening.   Discussed Hx, exam w/ Dr Benjie Karvonen including normal BP's in MAU. Will drawn Pre-E labs.  Informed Dr. Benjie Karvonen of normal labs, BP's.   MDM: Transient HTN w/ mild HA, transient vision changes. Low suspicion for Pre-E at this time, but will CTO closely 2/2 Hx Pre-E.   Assessment: 1. Transient hypertension of pregnancy, antepartum, third trimester     Plan: Discharge home in stable condition.  Labor and Pre-E precautions and fetal kick counts Follow-up Information    [facility] Follow up on 09/24/2016.   Why:  for prenatal visit and blood pressure check or sooner as needed if symptoms Contact information: [address] [place] [address] [phone]        [facility] ADMISS[facility]  Follow up.   Why:  as needed if symptoms worsen Contact information: [address] [id number] [place] [phone]            Medication List    STOP taking these medications   aspirin 81 MG chewable tablet     TAKE these medications   acetaminophen 500 MG tablet Commonly known as:  TYLENOL Take 1,000 mg by mouth every 6 (six) hours as needed for mild pain, moderate pain or headache.   lansoprazole 30 MG capsule Commonly known as:  PREVACID Take 30 mg by mouth every evening.   PRENATAL GUMMIES/DHA & FA 0.4-32.5 MG Chew Chew 2 each by mouth every evening.       [place], [place] 09/21/2016 5:26 PM

## 2016-10-04 ENCOUNTER — Inpatient Hospital Stay (HOSPITAL_COMMUNITY): Payer: Managed Care, Other (non HMO) | Admitting: Anesthesiology

## 2016-10-04 ENCOUNTER — Encounter (HOSPITAL_COMMUNITY): Payer: Self-pay | Admitting: *Deleted

## 2016-10-04 ENCOUNTER — Inpatient Hospital Stay (HOSPITAL_COMMUNITY)
Admission: AD | Admit: 2016-10-04 | Discharge: 2016-10-06 | DRG: 775 | Disposition: A | Discharge status: Home / Self Care | Payer: Managed Care, Other (non HMO) | Source: Ambulatory Visit | Attending: Obstetrics | Admitting: Obstetrics

## 2016-10-04 DIAGNOSIS — D6959 Other secondary thrombocytopenia: Secondary | ICD-10-CM | POA: Diagnosis present

## 2016-10-04 DIAGNOSIS — O365930 Maternal care for other known or suspected poor fetal growth, third trimester, not applicable or unspecified: Secondary | ICD-10-CM | POA: Diagnosis present

## 2016-10-04 DIAGNOSIS — Z6841 Body Mass Index (BMI) 40.0 and over, adult: Secondary | ICD-10-CM | POA: Diagnosis not present

## 2016-10-04 DIAGNOSIS — K589 Irritable bowel syndrome without diarrhea: Secondary | ICD-10-CM | POA: Diagnosis present

## 2016-10-04 DIAGNOSIS — Z3A38 38 weeks gestation of pregnancy: Secondary | ICD-10-CM

## 2016-10-04 DIAGNOSIS — K219 Gastro-esophageal reflux disease without esophagitis: Secondary | ICD-10-CM | POA: Diagnosis present

## 2016-10-04 DIAGNOSIS — O9962 Diseases of the digestive system complicating childbirth: Secondary | ICD-10-CM | POA: Diagnosis present

## 2016-10-04 DIAGNOSIS — D696 Thrombocytopenia, unspecified: Secondary | ICD-10-CM | POA: Diagnosis not present

## 2016-10-04 DIAGNOSIS — Z833 Family history of diabetes mellitus: Secondary | ICD-10-CM

## 2016-10-04 DIAGNOSIS — O9912 Other diseases of the blood and blood-forming organs and certain disorders involving the immune mechanism complicating childbirth: Secondary | ICD-10-CM | POA: Diagnosis present

## 2016-10-04 DIAGNOSIS — Z3493 Encounter for supervision of normal pregnancy, unspecified, third trimester: Secondary | ICD-10-CM | POA: Diagnosis present

## 2016-10-04 DIAGNOSIS — O99119 Other diseases of the blood and blood-forming organs and certain disorders involving the immune mechanism complicating pregnancy, unspecified trimester: Secondary | ICD-10-CM

## 2016-10-04 DIAGNOSIS — O134 Gestational [pregnancy-induced] hypertension without significant proteinuria, complicating childbirth: Principal | ICD-10-CM | POA: Diagnosis present

## 2016-10-04 DIAGNOSIS — Z8249 Family history of ischemic heart disease and other diseases of the circulatory system: Secondary | ICD-10-CM | POA: Diagnosis not present

## 2016-10-04 DIAGNOSIS — O139 Gestational [pregnancy-induced] hypertension without significant proteinuria, unspecified trimester: Secondary | ICD-10-CM | POA: Diagnosis present

## 2016-10-04 DIAGNOSIS — O99214 Obesity complicating childbirth: Secondary | ICD-10-CM | POA: Diagnosis present

## 2016-10-04 HISTORY — DX: Thrombocytopenia, unspecified: D69.6

## 2016-10-04 HISTORY — DX: Other diseases of the blood and blood-forming organs and certain disorders involving the immune mechanism complicating pregnancy, unspecified trimester: O99.119

## 2016-10-04 HISTORY — DX: Anxiety disorder, unspecified: F41.9

## 2016-10-04 HISTORY — DX: Gestational (pregnancy-induced) hypertension without significant proteinuria, unspecified trimester: O13.9

## 2016-10-04 LAB — COMPREHENSIVE METABOLIC PANEL
ALT: 11 U/L — ABNORMAL LOW (ref 14–54)
AST: 15 U/L (ref 15–41)
Albumin: 2.7 g/dL — ABNORMAL LOW (ref 3.5–5.0)
Alkaline Phosphatase: 154 U/L — ABNORMAL HIGH (ref 38–126)
Anion gap: 10 (ref 5–15)
BUN: 12 mg/dL (ref 6–20)
CHLORIDE: 108 mmol/L (ref 101–111)
CO2: 17 mmol/L — ABNORMAL LOW (ref 22–32)
Calcium: 8.4 mg/dL — ABNORMAL LOW (ref 8.9–10.3)
Creatinine, Ser: 0.78 mg/dL (ref 0.44–1.00)
Glucose, Bld: 112 mg/dL — ABNORMAL HIGH (ref 65–99)
POTASSIUM: 4 mmol/L (ref 3.5–5.1)
Sodium: 135 mmol/L (ref 135–145)
Total Bilirubin: 0.2 mg/dL — ABNORMAL LOW (ref 0.3–1.2)
Total Protein: 6.9 g/dL (ref 6.5–8.1)

## 2016-10-04 LAB — CBC
HCT: 34 % — ABNORMAL LOW (ref 36.0–46.0)
HCT: 35.3 % — ABNORMAL LOW (ref 36.0–46.0)
HEMATOCRIT: 32.9 % — AB (ref 36.0–46.0)
HEMOGLOBIN: 11.7 g/dL — AB (ref 12.0–15.0)
Hemoglobin: 11.6 g/dL — ABNORMAL LOW (ref 12.0–15.0)
Hemoglobin: 10.8 g/dL — ABNORMAL LOW (ref 12.0–15.0)
MCH: 29 pg (ref 26.0–34.0)
MCH: 28.1 pg (ref 26.0–34.0)
MCH: 28.3 pg (ref 26.0–34.0)
MCHC: 34.1 g/dL (ref 30.0–36.0)
MCHC: 33.1 g/dL (ref 30.0–36.0)
MCHC: 32.8 g/dL (ref 30.0–36.0)
MCV: 85 fL (ref 78.0–100.0)
MCV: 84.9 fL (ref 78.0–100.0)
MCV: 86.1 fL (ref 78.0–100.0)
PLATELETS: 156 10*3/uL (ref 150–400)
PLATELETS: 149 10*3/uL — AB (ref 150–400)
Platelets: 152 10*3/uL (ref 150–400)
RBC: 4 MIL/uL (ref 3.87–5.11)
RBC: 4.16 MIL/uL (ref 3.87–5.11)
RBC: 3.82 MIL/uL — ABNORMAL LOW (ref 3.87–5.11)
RDW: 13.5 % (ref 11.5–15.5)
RDW: 13.7 % (ref 11.5–15.5)
RDW: 13.7 % (ref 11.5–15.5)
WBC: 12.1 10*3/uL — AB (ref 4.0–10.5)
WBC: 14.5 10*3/uL — ABNORMAL HIGH (ref 4.0–10.5)
WBC: 22.1 10*3/uL — ABNORMAL HIGH (ref 4.0–10.5)

## 2016-10-04 LAB — PROTEIN / CREATININE RATIO, URINE
CREATININE, URINE: 97 mg/dL
PROTEIN CREATININE RATIO: 0.19 mg/mg{creat} — AB (ref 0.00–0.15)
TOTAL PROTEIN, URINE: 18 mg/dL

## 2016-10-04 LAB — TYPE AND SCREEN
ABO/RH(D): A POS
ANTIBODY SCREEN: NEGATIVE

## 2016-10-04 MED ORDER — OXYCODONE-ACETAMINOPHEN 5-325 MG PO TABS: 1.0000 | ORAL_TABLET | ORAL | Status: DC | PRN

## 2016-10-04 MED ORDER — EPHEDRINE 5 MG/ML INJ
10.0000 mg | INTRAVENOUS | Status: DC | PRN
  Administered 2016-10-04: 10 mg via INTRAVENOUS
  Filled 2016-10-04: qty 4

## 2016-10-04 MED ORDER — OXYCODONE-ACETAMINOPHEN 5-325 MG PO TABS: 2.0000 | ORAL_TABLET | ORAL | Status: DC | PRN

## 2016-10-04 MED ORDER — LACTATED RINGERS IV SOLN
INTRAVENOUS | Status: DC
  Administered 2016-10-04 (×3): via INTRAVENOUS

## 2016-10-04 MED ORDER — DIPHENHYDRAMINE HCL 50 MG/ML IJ SOLN: 12.5000 mg | INTRAMUSCULAR | Status: DC | PRN

## 2016-10-04 MED ORDER — FENTANYL 2.5 MCG/ML BUPIVACAINE 1/10 % EPIDURAL INFUSION (WH - ANES)
14.0000 mL/h | INTRAMUSCULAR | Status: DC | PRN
  Administered 2016-10-04: 11 mL/h via EPIDURAL
  Filled 2016-10-04: qty 100

## 2016-10-04 MED ORDER — SOD CITRATE-CITRIC ACID 500-334 MG/5ML PO SOLN: 30.0000 mL | ORAL | Status: DC | PRN

## 2016-10-04 MED ORDER — ONDANSETRON HCL 4 MG/2ML IJ SOLN
4.0000 mg | Freq: Four times a day (QID) | INTRAMUSCULAR | Status: DC | PRN
  Administered 2016-10-04: 4 mg via INTRAVENOUS
  Filled 2016-10-04: qty 2

## 2016-10-04 MED ORDER — LACTATED RINGERS IV SOLN
500.0000 mL | Freq: Once | INTRAVENOUS | Status: AC
  Administered 2016-10-04: 500 mL via INTRAVENOUS

## 2016-10-04 MED ORDER — PHENYLEPHRINE 40 MCG/ML (10ML) SYRINGE FOR IV PUSH (FOR BLOOD PRESSURE SUPPORT)
80.0000 ug | PREFILLED_SYRINGE | INTRAVENOUS | Status: DC | PRN
  Filled 2016-10-04: qty 5
  Filled 2016-10-04: qty 10

## 2016-10-04 MED ORDER — TERBUTALINE SULFATE 1 MG/ML IJ SOLN
0.2500 mg | Freq: Once | INTRAMUSCULAR | Status: DC | PRN
  Filled 2016-10-04: qty 1

## 2016-10-04 MED ORDER — ACETAMINOPHEN 325 MG PO TABS: 650.0000 mg | ORAL_TABLET | ORAL | Status: DC | PRN

## 2016-10-04 MED ORDER — LIDOCAINE HCL (PF) 1 % IJ SOLN
30.0000 mL | INTRAMUSCULAR | Status: DC | PRN
  Filled 2016-10-04: qty 30

## 2016-10-04 MED ORDER — EPHEDRINE 5 MG/ML INJ
10.0000 mg | INTRAVENOUS | Status: DC | PRN
  Filled 2016-10-04: qty 4

## 2016-10-04 MED ORDER — LIDOCAINE HCL (PF) 1 % IJ SOLN
INTRAMUSCULAR | Status: DC | PRN
  Administered 2016-10-04: 3 mL via EPIDURAL
  Administered 2016-10-04: 4 mL via EPIDURAL

## 2016-10-04 MED ORDER — LACTATED RINGERS IV SOLN: 500.0000 mL | INTRAVENOUS | Status: DC | PRN

## 2016-10-04 MED ORDER — PHENYLEPHRINE 40 MCG/ML (10ML) SYRINGE FOR IV PUSH (FOR BLOOD PRESSURE SUPPORT)
80.0000 ug | PREFILLED_SYRINGE | INTRAVENOUS | Status: AC | PRN
  Administered 2016-10-04 (×3): 80 ug via INTRAVENOUS

## 2016-10-04 MED ORDER — OXYTOCIN 40 UNITS IN LACTATED RINGERS INFUSION - SIMPLE MED
1.0000 m[IU]/min | INTRAVENOUS | Status: DC
  Administered 2016-10-04: 2 m[IU]/min via INTRAVENOUS
  Filled 2016-10-04: qty 1000

## 2016-10-04 MED ORDER — OXYTOCIN BOLUS FROM INFUSION
500.0000 mL | Freq: Once | INTRAVENOUS | Status: AC
  Administered 2016-10-04: 500 mL via INTRAVENOUS

## 2016-10-04 MED ORDER — OXYTOCIN 40 UNITS IN LACTATED RINGERS INFUSION - SIMPLE MED: 2.5000 [IU]/h | INTRAVENOUS | Status: DC

## 2016-10-04 NOTE — H&P (Signed)
Katherine Marshall is a 30 y.o. G2P1001 at [redacted]w[redacted]d presenting for contractions. Pt notes  contractions over the past day but worsening this morning. Since arriving to MAU however the contractions has decreased. . Good fetal movement, No vaginal bleeding, not leaking fluid though patient notes she has been losing her mucous plug. Patient reports no scotomata, no right upper quadrant pain but she has had a headache off and on over the past week. Headache earlier this morning but no current headache. Patient reports no edema patient has not been checking her blood pressure at home as this makes her anxious.  [facility] since first trimester - History of preeclampsia at 38 weeks in her first pregnancy. Complicated by IUGR with baby 5 lbs. 5 oz. Patient has been on baby aspirin through this pregnancy and has been monitored serially for preeclampsia. Other than some elevated blood pressures at home she has not had any blood pressure elevations until today. - Low baseline platelets. Platelets have been between 140 and 160 through her pregnancy. - Small for gestational age. At 35 and 5 baby was measuring 5 lbs. 1 oz. at the 24th percentile with before meals at the 8th percentile patient has had weekly BPP's which were reassuring and adequate fluid - GBS negative   Prenatal Transfer Tool  Maternal Diabetes: No Genetic Screening: Normal Maternal Ultrasounds/Referrals: Normal Fetal Ultrasounds or other Referrals:  None Maternal Substance Abuse:  No Significant Maternal Medications:  None Significant Maternal Lab Results: Lab values include: Other:      OB History    Gravida Para Term Preterm AB Living   2 1 1  0 0 1   SAB TAB Ectopic Multiple Live Births   0 0 0 0 1     Past Medical History:  Diagnosis Date  . Anxiety   . GERD (gastroesophageal reflux disease)   . Hypertension    PIH with last preg  . IBS (irritable bowel syndrome)   . Kidney stones   . Migraines    as a child  .  Pregnancy induced hypertension    Past Surgical History:  Procedure Laterality Date  . BASAL CELL CARCINOMA EXCISION    . DENTAL SURGERY     Family History: family history includes Cancer in her father, paternal grandfather, and paternal grandmother; Diabetes in her maternal uncle; Heart attack in her maternal grandfather and maternal grandmother; Hypertension in her brother, father, maternal grandfather, maternal grandmother, mother, paternal grandfather, and paternal grandmother. Social History:  reports that she has never smoked. She has never used smokeless tobacco. She reports that she drinks about 1.5 - 2.0 oz of alcohol per week . She reports that she does not use drugs.  Review of Systems - Negative except Headache this week, contractions which slowed down upon arrival to MAU   Dilation: 3 Effacement (%): 20 Exam by:: Joyceann Kruser Blood pressure 143/94, pulse 82, temperature 98.3 F (36.8 C), temperature source Oral, resp. rate 20, weight 99.2 kg (218 lb 9.6 oz), last menstrual period 01/05/2016, unknown if currently breastfeeding.  Physical Exam:   Vitals:   10/04/16 1101 10/04/16 1118 10/04/16 1132 10/04/16 1147  BP: 142/97 135/97 143/94 (!) 148/103  Pulse: 83 87 82 82  Resp: 20     Temp: 98.3 F (36.8 C)     TempSrc: Oral     Weight: 99.2 kg (218 lb 9.6 oz)       Gen: well appearing, no distress Back: no CVAT Abd: gravid, NT, no RUQ pain  LE: No edema, equal bilaterally, non-tender, 2+ DTR, no clonus Toco: Not tracking FH: baseline 165, accelerations present, no deceleratons, 10 beat variability  Prenatal labs: ABO, Rh:  a positive Antibody:  negative Rubella: !Error! Immune RPR:   nonreactive HBsAg:   negative HIV:   negative GBS:   negative 1 hr Glucola 91  Genetic screening normal NT, normal AFP Anatomy US normal   Assessment/Plan: 30 y.o. G2P1001 at [redacted]w[redacted]d 30 year old G2 P1 001 at 30 weeks and 3 days who presented for labor check found not to be in labor  but found to have elevated blood pressures in the setting of prior pregnancy complicated by preeclampsia. - Gestational hypertension. Patient is asymptomatic, labs are pending however she is greater than 38 weeks with a prior vaginal delivery a right cervix and elevated blood pressures consistent with gestational hypertension. Will admit patient for induction of labor. Patient is agreeable and desires this plan. Should blood pressures become very elevated we will consider magnesium sulfate. - History of low baseline platelets. Will watch for bleeding and follow platelet count. May consider early epidural - Small for gestational age baby - Induction of labor. Will plan Pitocin and AROM when in active labor. - Okay for epidural   Lavan Imes A. 10/04/2016, 11:53 AM

## 2016-10-04 NOTE — Progress Notes (Signed)
S: Doing well, no complaints, pain well controlled, planning epidural when contractions more painful  O: BP 137/87   Pulse 63   Temp 98.7 F (37.1 C) (Oral)   Resp 20   Wt 99.2 kg (218 lb 9.6 oz)   LMP 01/05/2016   BMI 41.30 kg/m    FHT:  FHR: 140s bpm, variability: moderate,  accelerations:  Present,  decelerations:  Present since AROM several variable decels UC:   regular, every 3-4 minutes, pit at 14 munits/ min SVE:   Dilation: 4 Effacement (%): 50 Station: -2 Exam by:: Dr Pamala Hurry AROM- copious dark meconium  A / P:  29 y.o.  Obstetric History   G2   P1   T1   P0   A0   L1    SAB0   TAB0   Ectopic0   Multiple0   Live Births1    at [redacted]w[redacted]d IOL, gest htn, IUGR, now with meconium, no evidence PEC  Fetal Wellbeing:  Category I Pain Control:  Labor support without medications  Anticipated MOD:  NSVD  Clive Parcel A. 10/04/2016, 6:17 PM

## 2016-10-04 NOTE — MAU Note (Addendum)
Been having some contractions for the past wk.  Have been more intense today, now every 7-75min.. Called and was told to come in .  Denies HA today, no visual changes, epigastric pain or increase in swelling. Hx of pre-eclampsia with first preg

## 2016-10-04 NOTE — Anesthesia Procedure Notes (Signed)
Epidural Patient location during procedure: OB Start time: 10/04/2016 6:56 PM  Staffing Anesthesiologist: Josephine Igo Performed: anesthesiologist   Preanesthetic Checklist Completed: patient identified, site marked, surgical consent, pre-op evaluation, timeout performed, IV checked, risks and benefits discussed and monitors and equipment checked  Epidural Patient position: sitting Prep: site prepped and draped and DuraPrep Patient monitoring: continuous pulse ox and blood pressure Approach: midline Location: L3-L4 Injection technique: LOR air  Needle:  Needle type: Tuohy  Needle gauge: 17 G Needle length: 9 cm and 9 Needle insertion depth: 7 cm Catheter type: closed end flexible Catheter size: 19 Gauge Catheter at skin depth: 12 cm Test dose: negative and Other  Assessment Events: blood not aspirated, injection not painful, no injection resistance, negative IV test and no paresthesia  Additional Notes Patient identified. Risks and benefits discussed including failed block, incomplete  Pain control, post dural puncture headache, nerve damage, paralysis, blood pressure Changes, nausea, vomiting, reactions to medications-both toxic and allergic and post Partum back pain. All questions were answered. Patient expressed understanding and wished to proceed. Sterile technique was used throughout procedure. Epidural site was Dressed with sterile barrier dressing. No paresthesias, signs of intravascular injection Or signs of intrathecal spread were encountered.  Patient was more comfortable after the epidural was dosed. Please see RN's note for documentation of vital signs and FHR which are stable.

## 2016-10-04 NOTE — Progress Notes (Signed)
Dr Pamala Hurry called with lab results and normal BP's. Will continue with induction process.

## 2016-10-04 NOTE — Anesthesia Preprocedure Evaluation (Addendum)
Anesthesia Evaluation  Patient identified by MRN, date of birth, ID band Patient awake    Reviewed: Allergy & Precautions, NPO status , Patient's Chart, lab work & pertinent test results  Airway Mallampati: III       Dental no notable dental hx. (+) Teeth Intact   Pulmonary neg pulmonary ROS,    Pulmonary exam normal breath sounds clear to auscultation       Cardiovascular hypertension, Pt. on medications Normal cardiovascular exam Rhythm:Regular Rate:Normal     Neuro/Psych  Headaches, Anxiety    GI/Hepatic Neg liver ROS, GERD  Medicated and Controlled,Hx/o IBS   Endo/Other  Morbid obesity  Renal/GU Renal diseaseHx/o renal calculi  negative genitourinary   Musculoskeletal negative musculoskeletal ROS (+)   Abdominal (+) + obese,   Peds  Hematology  (+) anemia ,   Anesthesia Other Findings   Reproductive/Obstetrics (+) Pregnancy Gestational HTN                             Anesthesia Physical Anesthesia Plan  ASA: III  Anesthesia Plan: Epidural   Post-op Pain Management:    Induction:   Airway Management Planned: Natural Airway  Additional Equipment:   Intra-op Plan:   Post-operative Plan:   Informed Consent: I have reviewed the patients History and Physical, chart, labs and discussed the procedure including the risks, benefits and alternatives for the proposed anesthesia with the patient or authorized representative who has indicated his/her understanding and acceptance.     Plan Discussed with: Anesthesiologist  Anesthesia Plan Comments:         Anesthesia Quick Evaluation

## 2016-10-04 NOTE — Anesthesia Pain Management Evaluation Note (Signed)
  CRNA Pain Management Visit Note  Patient: Katherine Marshall, 30 y.o., female  "Hello I am a member of the anesthesia team at [facility]. We have an anesthesia team available at all times to provide care throughout the hospital, including epidural management and anesthesia for C-section. I don't know your plan for the delivery whether it a natural birth, water birth, IV sedation, nitrous supplementation, doula or epidural, but we want to meet your pain goals."   1.Was your pain managed to your expectations on prior hospitalizations?   Yes   2.What is your expectation for pain management during this hospitalization?     IV pain meds  3.How can we help you reach that goal? epidural  Record the patient's initial score and the patient's pain goal.   Pain: 0/10  Pain Goal: 0/10 [facility] wants you to be able to say your pain was always managed very well.  Ailene Ards 10/04/2016

## 2016-10-05 LAB — RPR: RPR Ser Ql: NONREACTIVE

## 2016-10-05 MED ORDER — OXYCODONE HCL 5 MG PO TABS: 10.0000 mg | ORAL_TABLET | ORAL | Status: DC | PRN

## 2016-10-05 MED ORDER — SENNOSIDES-DOCUSATE SODIUM 8.6-50 MG PO TABS
2.0000 | ORAL_TABLET | ORAL | Status: DC
  Administered 2016-10-05 (×2): 2 via ORAL
  Filled 2016-10-05 (×2): qty 2

## 2016-10-05 MED ORDER — DIBUCAINE 1 % RE OINT: 1.0000 "application " | TOPICAL_OINTMENT | RECTAL | Status: DC | PRN

## 2016-10-05 MED ORDER — IBUPROFEN 600 MG PO TABS
600.0000 mg | ORAL_TABLET | Freq: Four times a day (QID) | ORAL | Status: DC
  Administered 2016-10-05 – 2016-10-06 (×6): 600 mg via ORAL
  Filled 2016-10-05 (×6): qty 1

## 2016-10-05 MED ORDER — TETANUS-DIPHTH-ACELL PERTUSSIS 5-2.5-18.5 LF-MCG/0.5 IM SUSP: 0.5000 mL | Freq: Once | INTRAMUSCULAR | Status: DC

## 2016-10-05 MED ORDER — ONDANSETRON HCL 4 MG/2ML IJ SOLN: 4.0000 mg | INTRAMUSCULAR | Status: DC | PRN

## 2016-10-05 MED ORDER — INFLUENZA VAC SPLIT QUAD 0.5 ML IM SUSY
0.5000 mL | PREFILLED_SYRINGE | INTRAMUSCULAR | Status: AC
  Administered 2016-10-06: 0.5 mL via INTRAMUSCULAR
  Filled 2016-10-05: qty 0.5

## 2016-10-05 MED ORDER — PRENATAL MULTIVITAMIN CH
1.0000 | ORAL_TABLET | Freq: Every day | ORAL | Status: DC
  Administered 2016-10-05: 1 via ORAL
  Filled 2016-10-05: qty 1

## 2016-10-05 MED ORDER — DIPHENHYDRAMINE HCL 25 MG PO CAPS: 25.0000 mg | ORAL_CAPSULE | Freq: Four times a day (QID) | ORAL | Status: DC | PRN

## 2016-10-05 MED ORDER — OXYCODONE HCL 5 MG PO TABS: 5.0000 mg | ORAL_TABLET | ORAL | Status: DC | PRN

## 2016-10-05 MED ORDER — BENZOCAINE-MENTHOL 20-0.5 % EX AERO
1.0000 "application " | INHALATION_SPRAY | CUTANEOUS | Status: DC | PRN
  Filled 2016-10-05: qty 56

## 2016-10-05 MED ORDER — ZOLPIDEM TARTRATE 5 MG PO TABS: 5.0000 mg | ORAL_TABLET | Freq: Every evening | ORAL | Status: DC | PRN

## 2016-10-05 MED ORDER — ONDANSETRON HCL 4 MG PO TABS: 4.0000 mg | ORAL_TABLET | ORAL | Status: DC | PRN

## 2016-10-05 MED ORDER — WITCH HAZEL-GLYCERIN EX PADS: 1.0000 "application " | MEDICATED_PAD | CUTANEOUS | Status: DC | PRN

## 2016-10-05 MED ORDER — ACETAMINOPHEN 325 MG PO TABS: 650.0000 mg | ORAL_TABLET | ORAL | Status: DC | PRN

## 2016-10-05 MED ORDER — COCONUT OIL OIL: 1.0000 "application " | TOPICAL_OIL | Status: DC | PRN

## 2016-10-05 MED ORDER — SIMETHICONE 80 MG PO CHEW: 80.0000 mg | CHEWABLE_TABLET | ORAL | Status: DC | PRN

## 2016-10-05 NOTE — Anesthesia Postprocedure Evaluation (Signed)
Anesthesia Post Note  Patient: Katherine Marshall  Procedure(s) Performed: * No procedures listed *  Patient location during evaluation: Mother Baby Anesthesia Type: Epidural Level of consciousness: awake and alert, oriented and patient cooperative Pain management: pain level controlled Vital Signs Assessment: post-procedure vital signs reviewed and stable Respiratory status: spontaneous breathing Cardiovascular status: stable Postop Assessment: no headache, epidural receding, patient able to bend at knees and no signs of nausea or vomiting Anesthetic complications: no Comments: Pain score 0.     Last Vitals:  Vitals:   10/05/16 0102 10/05/16 0500  BP: 110/62 108/65  Pulse: 68 (!) 56  Resp: 16 18  Temp: 36.8 C 36.5 C    Last Pain:  Vitals:   10/05/16 0600  TempSrc:   PainSc: 0-No pain   Pain Goal:                 [facility]

## 2016-10-05 NOTE — Progress Notes (Signed)
MOB was referred for history of depression/anxiety. * Referral screened out by Clinical Social Worker because none of the following criteria appear to apply: ~ History of anxiety/depression during this pregnancy, or of post-partum depression. ~ Diagnosis of anxiety and/or depression within last 3 years OR * MOB's symptoms currently being treated with medication and/or therapy. Please contact the Clinical Social Worker if needs arise, or if MOB requests.  CSW completed chart review and notes that there is no dx of Anxiety or mental health concerns noted in MOB's PNR.  Also, there is no dx of Anxiety in any previous H&Ps in MOB's chart.  CSW notes documentation on this admission that checking her BP at home makes her anxious.  CSW screening out referral at this time.

## 2016-10-05 NOTE — Lactation Note (Signed)
This note was copied from a baby's chart. Lactation Consultation Note  Patient Name: Katherine Marshall [id number] Date: 10/05/2016 Reason for consult: Initial assessment - baby is a post circ.  Baby is 48 hours old and per mom initially the baby latched well after delivery , and on nights would latch  The nurse started a NS and the baby last fed at 2:15 for 20 mins and milk in the NS when baby finished.  Baby stirring. LC assessed breast tissue with moms permission, LC noted the areolas bilaterally to be compressible  And easily hand expressed - with several drops. LC assisted with latch and baby latched easily with depth.  Multiple swallows noted, increased with breast compressions. Baby fed 10 mins , and fell asleep.  LC instructed mom on the release of baby from the breast. Nipple well rounded.  Baby asleep and left skin to skin with mom. LC encouraged mom to call on the nurses light for assistance to latch if needed.  Mother informed of post-discharge support and given phone number to the lactation department, including services for phone call assistance; out-patient appointments; and breastfeeding support group. List of other breastfeeding resources in the community given in the handout. Encouraged mother to call for problems or concerns related to breastfeeding.   Maternal Data Has patient been taught Hand Expression?: Yes (hand expressed with several drops of colostrum ) Does the patient have breastfeeding experience prior to this delivery?: Yes  Feeding Feeding Type: Breast Fed Length of feed: 10 min (multiple swallows 6-8 x's in 10 mins , LC noted )  LATCH Score/Interventions Latch: Grasps breast easily, tongue down, lips flanged, rhythmical sucking. Intervention(s): Skin to skin;Teach feeding cues;Waking techniques Intervention(s): Adjust position;Assist with latch;Breast massage;Breast compression  Audible Swallowing: Spontaneous and intermittent  Type of Nipple: Everted at  rest and after stimulation (areola compressible )  Comfort (Breast/Nipple): Soft / non-tender     Hold (Positioning): Full assist, staff holds infant at breast Intervention(s): Breastfeeding basics reviewed;Support Pillows;Position options;Skin to skin  LATCH Score: 8  Lactation Tools Discussed/Used Tools: Shells Nipple shield size: Other (comment) (did not need NS to latch the baby ) Shell Type: Inverted WIC Program: No   Consult Status Consult Status: Follow-up Date: 10/06/16 Follow-up type: In-patient    [place] 10/05/2016, 3:22 PM

## 2016-10-05 NOTE — Plan of Care (Signed)
Problem: Coping: Goal: Ability to identify and utilize available resources and services will improve Outcome: Progressing Parents concerned about baby breastfeeding, collaborating with lactation

## 2016-10-05 NOTE — Progress Notes (Signed)
Post Partum Day 1 S/P spontaneous vaginal  Feeding: breast Subjective: No HA, SOB, CP, F/C, breast symptoms. Normal vaginal bleeding, no clots. Pain controlled. Ambulating without symptoms. Voiding without difficulty    Objective: BP 108/65 (BP Location: Right Arm)   Pulse (!) 56   Temp 97.7 F (36.5 C) (Oral)   Resp 18   Ht 5' (1.524 m)   Wt 98.9 kg (218 lb)   LMP 01/05/2016   SpO2 98%   Breastfeeding? Unknown   BMI 42.58 kg/m  I&O reviewed.   Vitals:   10/04/16 2202 10/04/16 2330 10/05/16 0102 10/05/16 0500  BP: (!) 94/51 130/76 110/62 108/65  Pulse: 62 69 68 (!) 56  Resp: 20 18 16 18   Temp:  99.1 F (37.3 C) 98.3 F (36.8 C) 97.7 F (36.5 C)  TempSrc:  Oral Oral Oral  SpO2:      Weight:      Height:         Physical Exam:  General: alert Lochia: appropriate Uterine Fundus: firm DVT Evaluation: No evidence of DVT seen on physical exam. Ext: No c/c/e  Recent Labs  10/04/16 1815 10/04/16 2206  HGB 11.7* 10.8*  HCT 35.3* 32.9*      Assessment/Plan: 30 y.o.  PPD #1 .  normal postpartum exam Continue current postpartum care Ambulate Gest htn- appears resolved, warning sx for PP d/w pt Thrombocytopenia. Stable. No further f/u needed Dispo- home tomorrow   LOS: 1 day   Tidus Upchurch A. 10/05/2016 10:03 AM

## 2016-10-06 ENCOUNTER — Encounter (HOSPITAL_COMMUNITY): Payer: Self-pay | Admitting: Obstetrics and Gynecology

## 2016-10-06 DIAGNOSIS — D696 Thrombocytopenia, unspecified: Secondary | ICD-10-CM

## 2016-10-06 DIAGNOSIS — O99119 Other diseases of the blood and blood-forming organs and certain disorders involving the immune mechanism complicating pregnancy, unspecified trimester: Secondary | ICD-10-CM

## 2016-10-06 HISTORY — DX: Thrombocytopenia, unspecified: D69.6

## 2016-10-06 MED ORDER — SODIUM BICARBONATE 8.4 % IV SOLN
INTRAVENOUS | Status: DC | PRN
  Administered 2016-10-04: 7 mL via EPIDURAL

## 2016-10-06 MED ORDER — IBUPROFEN 600 MG PO TABS: 600.0000 mg | ORAL_TABLET | Freq: Four times a day (QID) | ORAL | 0 refills | Status: DC

## 2016-10-06 NOTE — Progress Notes (Signed)
Patient ID: Katherine Marshall, female   DOB: April 05, 1986, 30 y.o.   MRN: [id number] Post Partum Day #2            Information for the patient's newborn:  Ayasha, E[place] [id number]  female   / circumcision done Feeding: breast  Subjective: No HA, SOB, CP, F/C, breast symptoms. Pain well-controlled with Motrin. Normal vaginal bleeding, no clots.      Objective:  Temp:  [97.9 F (36.6 C)-98.7 F (37.1 C)] 98.7 F (37.1 C) (12/17 1800) Pulse Rate:  [60-96] 61 (12/18 0650) Resp:  [18-20] 18 (12/18 0650) BP: (110-116)/(62-78) 110/78 (12/18 0650) SpO2:  [99 %] 99 % (12/17 1800)  No intake or output data in the 24 hours ending 10/06/16 0908     Recent Labs  10/04/16 1815 10/04/16 2206  WBC 14.5* 22.1*  HGB 11.7* 10.8*  HCT 35.3* 32.9*  PLT 152 149*    Blood type: --/--/A POS (12/16 1155) Rubella: Immune    Physical Exam:  General: alert, cooperative and no distress Uterine Fundus: firm, midline, U-2 Lochia: appropriate Perineum: 1st degree perineal repair healing well, edema none DVT Evaluation: No evidence of DVT seen on physical exam. Negative Homan's sign. No cords or calf tenderness. No significant calf/ankle edema.    Assessment/Plan: PPD # 2 / 30 y.o., [id number] S/P: spontaneous vaginal   Principal Problem:   Postpartum care following vaginal delivery (12/16) Active Problems:   Gestational hypertension - stable BP's / PEC s/s discussed with pt   Gestational thrombocytopenia without hemorrhage (HCC) - stable   normal postpartum exam  Continue current postpartum care  D/C home  BP recheck at [facility] in 1 week   LOS: 2 days   Graceann Congress, MSN, CNM 10/06/2016, 9:04 AM

## 2016-10-06 NOTE — Lactation Note (Signed)
This note was copied from a baby's chart. Lactation Consultation Note  Patient Name: Katherine Marshall [id number] Date: 10/06/2016 Reason for consult: Follow-up assessment   With this mom and term baby, weight under six pounds, but breast feeding well. Good wet diapers, no stool since MSF at birth, abdomen soft. Mom had baby latched in a semi-football hold when I walked in the room. I had mom use her boppy pillow and pillows to get both herself and baby comfortable before latching. The baby latched much deeper, with good breast movement. Mom could feel this latch deeper, more comfortable. Mom reminded to support her breast for the first few weeks, to maintain a deep latch. Breast care reviewed. Mom knows to call for questions/conerns. O/P support group advised.   Maternal Data    Feeding Feeding Type: Breast Fed Length of feed:  (baby was latched when I walked in, then relatched ans still feeding when I left the room)  LATCH Score/Interventions Latch: Grasps breast easily, tongue down, lips flanged, rhythmical sucking. Intervention(s): Teach feeding cues;Waking techniques Intervention(s): Adjust position;Assist with latch;Breast compression  Audible Swallowing: Spontaneous and intermittent  Type of Nipple: Everted at rest and after stimulation  Comfort (Breast/Nipple): Soft / non-tender     Hold (Positioning): Assistance needed to correctly position infant at breast and maintain latch. Intervention(s): Breastfeeding basics reviewed;Support Pillows;Position options;Skin to skin  LATCH Score: 9  Lactation Tools Discussed/Used Nipple shield size: 24   Consult Status Consult Status: Complete Follow-up type: Call as needed    Tonna Corner 10/06/2016, 9:46 AM

## 2016-10-06 NOTE — Discharge Summary (Signed)
OB Discharge Summary     Patient Name: Katherine Marshall DOB: 1986-01-28 MRN: [id number]  Date of admission: 10/04/2016 Delivering MD: Aloha Gell   Date of discharge: 10/06/2016  Admitting diagnosis: 38 weeks contractions Intrauterine pregnancy: [redacted]w[redacted]d     Secondary diagnosis:  Principal Problem:   Postpartum care following vaginal delivery (12/16) Active Problems:   Gestational hypertension   Gestational thrombocytopenia without hemorrhage ([place])  Additional problems: none     Discharge diagnosis: Term Pregnancy Delivered                                                                                                Post partum procedures:none  Augmentation: AROM and Pitocin  Complications: None  Hospital course:  Induction of Labor With Vaginal Delivery   30 y.o. yo G2P2002 at [redacted]w[redacted]d was admitted to the hospital 10/04/2016 for induction of labor.  Indication for induction: Gestational hypertension.  Patient had an uncomplicated labor course as follows: Membrane Rupture Time/Date: 6:07 PM ,10/04/2016   Intrapartum Procedures: Episiotomy: None [1]                                         Lacerations:  1st degree [2];Perineal [11]  Patient had delivery of a Viable infant.  Information for the patient's newborn:  Joyanna, Rey [id number]  Delivery Method: Vaginal, Spontaneous Delivery (Filed from Delivery Summary)   10/04/2016  Details of delivery can be found in separate delivery note.  Patient had a routine postpartum course. Patient is discharged home 10/06/16.   Physical exam Vitals:   10/05/16 0500 10/05/16 1300 10/05/16 1800 10/06/16 0650  BP: 108/65 111/62 116/76 110/78  Pulse: (!) 56 60 96 61  Resp: 18  20 18   Temp: 97.7 F (36.5 C) 97.9 F (36.6 C) 98.7 F (37.1 C)   TempSrc: Oral Oral Oral   SpO2:   99%   Weight:      Height:       General: alert Lochia: appropriate Uterine Fundus: firm, midline, U-2 DVT Evaluation: No evidence of DVT seen on  physical exam. Negative Homan's sign. No cords or calf tenderness. No significant calf/ankle edema. Labs: Lab Results  Component Value Date   WBC 22.1 (H) 10/04/2016   HGB 10.8 (L) 10/04/2016   HCT 32.9 (L) 10/04/2016   MCV 86.1 10/04/2016   PLT 149 (L) 10/04/2016   CMP Latest Ref Rng & Units 10/04/2016  Glucose 65 - 99 mg/dL 112(H)  BUN 6 - 20 mg/dL 12  Creatinine 0.44 - 1.00 mg/dL 0.78  Sodium 135 - 145 mmol/L 135  Potassium 3.5 - 5.1 mmol/L 4.0  Chloride 101 - 111 mmol/L 108  CO2 22 - 32 mmol/L 17(L)  Calcium 8.9 - 10.3 mg/dL 8.4(L)  Total Protein 6.5 - 8.1 g/dL 6.9  Total Bilirubin 0.3 - 1.2 mg/dL 0.2(L)  Alkaline Phos 38 - 126 U/L 154(H)  AST 15 - 41 U/L 15  ALT 14 - 54 U/L 11(L)    Discharge instruction: per After  Visit Summary and "Baby and Me Booklet".  After visit meds:  Allergies as of 10/06/2016   No Known Allergies     Medication List    TAKE these medications   acetaminophen 500 MG tablet Commonly known as:  TYLENOL Take 1,000 mg by mouth every 6 (six) hours as needed for mild pain, moderate pain or headache.   aspirin EC 81 MG tablet Take 81 mg by mouth daily.   ibuprofen 600 MG tablet Commonly known as:  ADVIL,MOTRIN Take 1 tablet (600 mg total) by mouth every 6 (six) hours.   lansoprazole 30 MG capsule Commonly known as:  PREVACID Take 30 mg by mouth every evening.   PRENATAL GUMMIES/DHA & FA 0.4-32.5 MG Chew Chew 2 each by mouth every evening.       Diet: routine diet  Activity: Advance as tolerated. Pelvic rest for 6 weeks.   Outpatient follow up:6 weeks Follow up Appt:No future appointments. Follow up Visit:No Follow-up on file.  Postpartum contraception: Undecided  Newborn Data: Live born female on 10/04/2016 Birth Weight: 6 lb 0.7 oz (2741 g) APGAR: 9, 9  Baby Feeding: Breast Disposition:home with mother   10/06/2016 Laury Deep, Jerilynn Mages, CNM

## 2016-10-06 NOTE — Discharge Instructions (Signed)
Postpartum Depression and Baby Blues The postpartum period begins right after the birth of a baby. During this time, there is often a great amount of joy and excitement. It is also a time of many changes in the life of the parents. Regardless of how many times a mother gives birth, each child brings new challenges and dynamics to the family. It is not unusual to have feelings of excitement along with confusing shifts in moods, emotions, and thoughts. All mothers are at risk of developing postpartum depression or the "baby blues." These mood changes can occur right after giving birth, or they may occur many months after giving birth. The baby blues or postpartum depression can be mild or severe. Additionally, postpartum depression can go away rather quickly, or it can be a long-term condition. What are the causes? Raised hormone levels and the rapid drop in those levels are thought to be a main cause of postpartum depression and the baby blues. A number of hormones change during and after pregnancy. Estrogen and progesterone usually decrease right after the delivery of your baby. The levels of thyroid hormone and various cortisol steroids also rapidly drop. Other factors that play a role in these mood changes include major life events and genetics. What increases the risk? If you have any of the following risks for the baby blues or postpartum depression, know what symptoms to watch out for during the postpartum period. Risk factors that may increase the likelihood of getting the baby blues or postpartum depression include:  Having a personal or family history of depression.  Having depression while being pregnant.  Having premenstrual mood issues or mood issues related to oral contraceptives.  Having a lot of life stress.  Having marital conflict.  Lacking a social support network.  Having a baby with special needs.  Having health problems, such as diabetes. What are the signs or  symptoms? Symptoms of baby blues include:  Brief changes in mood, such as going from extreme happiness to sadness.  Decreased concentration.  Difficulty sleeping.  Crying spells, tearfulness.  Irritability.  Anxiety. Symptoms of postpartum depression typically begin within the first month after giving birth. These symptoms include:  Difficulty sleeping or excessive sleepiness.  Marked weight loss.  Agitation.  Feelings of worthlessness.  Lack of interest in activity or food. Postpartum psychosis is a very serious condition and can be dangerous. Fortunately, it is rare. Displaying any of the following symptoms is cause for immediate medical attention. Symptoms of postpartum psychosis include:  Hallucinations and delusions.  Bizarre or disorganized behavior.  Confusion or disorientation. How is this diagnosed? A diagnosis is made by an evaluation of your symptoms. There are no medical or lab tests that lead to a diagnosis, but there are various questionnaires that a health care provider may use to identify those with the baby blues, postpartum depression, or psychosis. Often, a screening tool called the [place] Postnatal Depression Scale is used to diagnose depression in the postpartum period. How is this treated? The baby blues usually goes away on its own in 1-2 weeks. Social support is often all that is needed. You will be encouraged to get adequate sleep and rest. Occasionally, you may be given medicines to help you sleep. Postpartum depression requires treatment because it can last several months or longer if it is not treated. Treatment may include individual or group therapy, medicine, or both to address any social, physiological, and psychological factors that may play a role in the depression. Regular exercise, a  healthy diet, rest, and social support may also be strongly recommended. Postpartum psychosis is more serious and needs treatment right away. Hospitalization is  often needed. Follow these instructions at home:  Get as much rest as you can. Nap when the baby sleeps.  Exercise regularly. Some women find yoga and walking to be beneficial.  Eat a balanced and nourishing diet.  Do little things that you enjoy. Have a cup of tea, take a bubble bath, read your favorite magazine, or listen to your favorite music.  Avoid alcohol.  Ask for help with household chores, cooking, grocery shopping, or running errands as needed. Do not try to do everything.  Talk to people close to you about how you are feeling. Get support from your partner, family members, friends, or other new moms.  Try to stay positive in how you think. Think about the things you are grateful for.  Do not spend a lot of time alone.  Only take over-the-counter or prescription medicine as directed by your health care provider.  Keep all your postpartum appointments.  Let your health care provider know if you have any concerns. Contact a health care provider if: You are having a reaction to or problems with your medicine. Get help right away if:  You have suicidal feelings.  You think you may harm the baby or someone else. This information is not intended to replace advice given to you by your health care provider. Make sure you discuss any questions you have with your health care provider. Document Released: 07/10/2004 Document Revised: 03/13/2016 Document Reviewed: 07/18/2013 Elsevier Interactive Patient Education  2017 [place]. Postpartum Care After Vaginal Delivery The period of time right after you deliver your newborn is called the postpartum period. What kind of medical care will I receive?  You may continue to receive fluids and medicines through an IV tube inserted into one of your veins.  If an incision was made near your vagina (episiotomy) or if you had some vaginal tearing during delivery, cold compresses may be placed on your episiotomy or your tear. This  helps to reduce pain and swelling.  You may be given a squirt bottle to use when you go to the bathroom. You may use this until you are comfortable wiping as usual. To use the squirt bottle, follow these steps:  Before you urinate, fill the squirt bottle with warm water. Do not use hot water.  After you urinate, while you are sitting on the toilet, use the squirt bottle to rinse the area around your urethra and vaginal opening. This rinses away any urine and blood.  You may do this instead of wiping. As you start healing, you may use the squirt bottle before wiping yourself. Make sure to wipe gently.  Fill the squirt bottle with clean water every time you use the bathroom.  You will be given sanitary pads to wear. How can I expect to feel?  You may not feel the need to urinate for several hours after delivery.  You will have some soreness and pain in your abdomen and vagina.  If you are breastfeeding, you may have uterine contractions every time you breastfeed for up to several weeks postpartum. Uterine contractions help your uterus return to its normal size.  It is normal to have vaginal bleeding (lochia) after delivery. The amount and appearance of lochia is often similar to a menstrual period in the first week after delivery. It will gradually decrease over the next few weeks to a  dry, yellow-brown discharge. For most women, lochia stops completely by 6-8 weeks after delivery. Vaginal bleeding can vary from woman to woman.  Within the first few days after delivery, you may have breast engorgement. This is when your breasts feel heavy, full, and uncomfortable. Your breasts may also throb and feel hard, tightly stretched, warm, and tender. After this occurs, you may have milk leaking from your breasts.Your health care provider can help you relieve discomfort due to breast engorgement. Breast engorgement should go away within a few days.  You may feel more sad or worried than normal due to  hormonal changes after delivery. These feelings should not last more than a few days. If these feelings do not go away after several days, speak with your health care provider. How should I care for myself?  Tell your health care provider if you have pain or discomfort.  Drink enough water to keep your urine clear or pale yellow.  Wash your hands thoroughly with soap and water for at least 20 seconds after changing your sanitary pads, after using the toilet, and before holding or feeding your baby.  If you are not breastfeeding, avoid touching your breasts a lot. Doing this can make your breasts produce more milk.  If you become weak or lightheaded, or you feel like you might faint, ask for help before:  Getting out of bed.  Showering.  Change your sanitary pads frequently. Watch for any changes in your flow, such as a sudden increase in volume, a change in color, the passing of large blood clots. If you pass a blood clot from your vagina, save it to show to your health care provider. Do not flush blood clots down the toilet without having your health care provider look at them.  Make sure that all your vaccinations are up to date. This can help protect you and your baby from getting certain diseases. You may need to have immunizations done before you leave the hospital.  If desired, talk with your health care provider about methods of family planning or birth control (contraception). How can I start bonding with my baby? Spending as much time as possible with your baby is very important. During this time, you and your baby can get to know each other and develop a bond. Having your baby stay with you in your room (rooming in) can give you time to get to know your baby. Rooming in can also help you become comfortable caring for your baby. Breastfeeding can also help you bond with your baby. How can I plan for returning home with my baby?  Make sure that you have a car seat installed in your  vehicle.  Your car seat should be checked by a certified car seat installer to make sure that it is installed safely.  Make sure that your baby fits into the car seat safely.  Ask your health care provider any questions you have about caring for yourself or your baby. Make sure that you are able to contact your health care provider with any questions after leaving the hospital. This information is not intended to replace advice given to you by your health care provider. Make sure you discuss any questions you have with your health care provider. Document Released: 08/03/2007 Document Revised: 03/10/2016 Document Reviewed: 09/10/2015 Elsevier Interactive Patient Education  2017 [employer] Mastitis Mastitis is inflammation of the breast tissue. It occurs most often in women who are breastfeeding, but it can also affect other women, and  even sometimes men. CAUSES  Mastitis is usually caused by a bacterial infection. Bacteria enter the breast tissue through cuts or openings in the skin. Typically, this occurs with breastfeeding because of cracked or irritated skin. Sometimes, it can occur even when there is no opening in the skin. It can be associated with plugged milk (lactiferous) ducts. Nipple piercing can also lead to mastitis. Also, some forms of breast cancer can cause mastitis. SIGNS AND SYMPTOMS   Swelling, redness, tenderness, and pain in an area of the breast.  Swelling of the glands under the arm on the same side.  Fever. If an infection is allowed to progress, a collection of pus (abscess) may develop. DIAGNOSIS  Your health care provider can usually diagnose mastitis based on your symptoms and a physical exam. Tests may be done to help confirm the diagnosis. These may include:   Removal of pus from the breast by applying pressure to the area. This pus can be examined in the lab to determine which bacteria are present. If an abscess has developed, the fluid in the abscess can be  removed with a needle. This can also be used to confirm the diagnosis and determine the bacteria present. In most cases, pus will not be present.  Blood tests to determine if your body is fighting a bacterial infection.  Mammogram or ultrasound tests to rule out other problems or diseases. TREATMENT  Antibiotic medicine is used to treat a bacterial infection. Your health care provider will determine which bacteria are most likely causing the infection and will select an appropriate antibiotic. This is sometimes changed based on the results of tests performed to identify the bacteria, or if there is no response to the antibiotic selected. Antibiotics are usually given by mouth. You may also be given medicine for pain. Mastitis that occurs with breastfeeding will sometimes go away on its own, so your health care provider may choose to wait 24 hours after first seeing you to decide whether a prescription medicine is needed. HOME CARE INSTRUCTIONS   Only take over-the-counter or prescription medicines for pain, fever, or discomfort as directed by your health care provider.  If your health care provider prescribed an antibiotic, take the medicine as directed. Make sure you finish it even if you start to feel better.  Do not wear a tight or underwire bra. Wear a soft, supportive bra.  Increase your fluid intake, especially if you have a fever.  Women who are breastfeeding should follow these instructions:  Continue to empty the breast. Your health care provider can tell you whether this milk is safe for your infant or needs to be thrown out. You may be told to stop nursing until your health care provider thinks it is safe for your baby. Use a breast pump if you are advised to stop nursing.  Keep your nipples clean and dry.  Empty the first breast completely before going to the other breast. If your baby is not emptying your breasts completely for some reason, use a breast pump to empty your  breasts.  If you go back to work, pump your breasts while at work to stay in time with your nursing schedule.  Avoid allowing your breasts to become overly filled with milk (engorged). SEEK MEDICAL CARE IF:   You have pus-like discharge from the breast.  Your symptoms do not improve with the treatment prescribed by your health care provider within 2 days. SEEK IMMEDIATE MEDICAL CARE IF:   Your pain and swelling  are getting worse.  You have pain that is not controlled with medicine.  You have a red line extending from the breast toward your armpit.  You have a fever or persistent symptoms for more than 2-3 days.  You have a fever and your symptoms suddenly get worse. This information is not intended to replace advice given to you by your health care provider. Make sure you discuss any questions you have with your health care provider. Document Released: 10/06/2005 Document Revised: 10/11/2013 Document Reviewed: 05/06/2013 Elsevier Interactive Patient Education  2017 [place] Tips If you are breastfeeding, there may be times when you cannot feed your baby directly. Returning to work or going on a trip are common examples. Pumping allows you to store breast milk and feed it to your baby later. You may not get much milk when you first start to pump. Your breasts should start to make more after a few days. If you pump at the times you usually feed your baby, you may be able to keep making enough milk to feed your baby without also using formula. The more often you pump, the more milk you will produce. When should I pump?  You can begin to pump soon after delivery. However, some experts recommend waiting about 4 weeks before giving your infant a bottle to make sure breastfeeding is going well.  If you plan to return to work, begin pumping a few weeks before. This will help you develop techniques that work best for you. It also lets you build up a supply of breast  milk.  When you are with your infant, feed on demand and pump after each feeding.  When you are away from your infant for several hours, pump for about 15 minutes every 2-3 hours. Pump both breasts at the same time if you can.  If your infant has a formula feeding, make sure to pump around the same time.  If you drink any alcohol, wait 2 hours before pumping. How do I prepare to pump? Your let-down reflexis the natural reaction to stimulation that makes your breast milk flow. It is easier to stimulate this reflex when you are relaxed. Find relaxation techniques that work for you. If you have difficulty with your let-down reflex, try these methods:  Smell one of your infant's blankets or an item of clothing.  Look at a picture or video of your infant.  Sit in a quiet, private space.  Massage the breast you plan to pump.  Place soothing warmth on the breast.  Play relaxing music. What are some general breast pumping tips?  Wash your hands before you pump. You do not need to wash your nipples or breasts.  There are three ways to pump.  You can use your hand to massage and compress your breast.  You can use a handheld manual pump.  You can use an electric pump.  Make sure the suction cup (flange) on the breast pump is the right size. Place the flange directly over the nipple. If it is the wrong size or placed the wrong way, it may be painful and cause nipple damage.  If pumping is uncomfortable, apply a small amount of purified or modified lanolin to your nipple and areola.  If you are using an electric pump, adjust the speed and suction power to be more comfortable.  If pumping is painful or if you are not getting very much milk, you may need a different type of pump. A lactation  consultant can help you determine what type of pump to use.  Keep a full water bottle near you at all times. Drinking lots of fluid helps you make more milk.  You can store your milk to use later.  Pumped breast milk can be stored in a sealable, sterile container or plastic bag. Label all stored breast milk with the date you pumped it.  Milk can stay out at room temperature for up to 8 hours.  You can store your milk in the refrigerator for up to 8 days.  You can store your milk in the freezer for 3 months. Thaw frozen milk using warm water. Do not put it in the microwave.  Do not smoke. Smoking can lower your milk supply and harm your infant. If you need help quitting, ask your health care provider to recommend a program. When should I call my health care provider or a lactation consultant?  You are having trouble pumping.  You are concerned that you are not making enough milk.  You have nipple pain, soreness, or redness.  You want to use birth control. Birth control pills may lower your milk supply. Talk to your health care provider about your options. This information is not intended to replace advice given to you by your health care provider. Make sure you discuss any questions you have with your health care provider. Document Released: 03/26/2010 Document Revised: 03/19/2016 Document Reviewed: 07/29/2013 Elsevier Interactive Patient Education  2017 [employer]. Breastfeeding Deciding to breastfeed is one of the best choices you can make for you and your baby. A change in hormones during pregnancy causes your breast tissue to grow and increases the number and size of your milk ducts. These hormones also allow proteins, sugars, and fats from your blood supply to make breast milk in your milk-producing glands. Hormones prevent breast milk from being released before your baby is born as well as prompt milk flow after birth. Once breastfeeding has begun, thoughts of your baby, as well as his or her sucking or crying, can stimulate the release of milk from your milk-producing glands. Benefits of breastfeeding For Your Baby  Your first milk (colostrum) helps your baby's digestive  system function better.  There are antibodies in your milk that help your baby fight off infections.  Your baby has a lower incidence of asthma, allergies, and sudden infant death syndrome.  The nutrients in breast milk are better for your baby than infant formulas and are designed uniquely for your babys needs.  Breast milk improves your baby's brain development.  Your baby is less likely to develop other conditions, such as childhood obesity, asthma, or type 2 diabetes mellitus. For You  Breastfeeding helps to create a very special bond between you and your baby.  Breastfeeding is convenient. Breast milk is always available at the correct temperature and costs nothing.  Breastfeeding helps to burn calories and helps you lose the weight gained during pregnancy.  Breastfeeding makes your uterus contract to its prepregnancy size faster and slows bleeding (lochia) after you give birth.  Breastfeeding helps to lower your risk of developing type 2 diabetes mellitus, osteoporosis, and breast or ovarian cancer later in life. Signs that your baby is hungry Early Signs of Hunger  Increased alertness or activity.  Stretching.  Movement of the head from side to side.  Movement of the head and opening of the mouth when the corner of the mouth or cheek is stroked (rooting).  Increased sucking sounds, smacking lips, cooing,  sighing, or squeaking.  Hand-to-mouth movements.  Increased sucking of fingers or hands. Late Signs of Hunger  Fussing.  Intermittent crying. Extreme Signs of Hunger  Signs of extreme hunger will require calming and consoling before your baby will be able to breastfeed successfully. Do not wait for the following signs of extreme hunger to occur before you initiate breastfeeding:  Restlessness.  A loud, strong cry.  Screaming. Breastfeeding basics  Breastfeeding Initiation  Find a comfortable place to sit or lie down, with your neck and back well  supported.  Place a pillow or rolled up blanket under your baby to bring him or her to the level of your breast (if you are seated). Nursing pillows are specially designed to help support your arms and your baby while you breastfeed.  Make sure that your baby's abdomen is facing your abdomen.  Gently massage your breast. With your fingertips, massage from your chest wall toward your nipple in a circular motion. This encourages milk flow. You may need to continue this action during the feeding if your milk flows slowly.  Support your breast with 4 fingers underneath and your thumb above your nipple. Make sure your fingers are well away from your nipple and your babys mouth.  Stroke your baby's lips gently with your finger or nipple.  When your baby's mouth is open wide enough, quickly bring your baby to your breast, placing your entire nipple and as much of the colored area around your nipple (areola) as possible into your baby's mouth.  More areola should be visible above your baby's upper lip than below the lower lip.  Your baby's tongue should be between his or her lower gum and your breast.  Ensure that your baby's mouth is correctly positioned around your nipple (latched). Your baby's lips should create a seal on your breast and be turned out (everted).  It is common for your baby to suck about 2-3 minutes in order to start the flow of breast milk. Latching  Teaching your baby how to latch on to your breast properly is very important. An improper latch can cause nipple pain and decreased milk supply for you and poor weight gain in your baby. Also, if your baby is not latched onto your nipple properly, he or she may swallow some air during feeding. This can make your baby fussy. Burping your baby when you switch breasts during the feeding can help to get rid of the air. However, teaching your baby to latch on properly is still the best way to prevent fussiness from swallowing air while  breastfeeding. Signs that your baby has successfully latched on to your nipple:  Silent tugging or silent sucking, without causing you pain.  Swallowing heard between every 3-4 sucks.  Muscle movement above and in front of his or her ears while sucking. Signs that your baby has not successfully latched on to nipple:  Sucking sounds or smacking sounds from your baby while breastfeeding.  Nipple pain. If you think your baby has not latched on correctly, slip your finger into the corner of your babys mouth to break the suction and place it between your baby's gums. Attempt breastfeeding initiation again. Signs of Successful Breastfeeding  Signs from your baby:  A gradual decrease in the number of sucks or complete cessation of sucking.  Falling asleep.  Relaxation of his or her body.  Retention of a small amount of milk in his or her mouth.  Letting go of your breast by himself or  herself. Signs from you:  Breasts that have increased in firmness, weight, and size 1-3 hours after feeding.  Breasts that are softer immediately after breastfeeding.  Increased milk volume, as well as a change in milk consistency and color by the fifth day of breastfeeding.  Nipples that are not sore, cracked, or bleeding. Signs That Your [employer]el Books is Getting Enough Milk  Wetting at least 1-2 diapers during the first 24 hours after birth.  Wetting at least 5-6 diapers every 24 hours for the first week after birth. The urine should be clear or pale yellow by 5 days after birth.  Wetting 6-8 diapers every 24 hours as your baby continues to grow and develop.  At least 3 stools in a 24-hour period by age 56 days. The stool should be soft and yellow.  At least 3 stools in a 24-hour period by age 88 days. The stool should be seedy and yellow.  No loss of weight greater than 10% of birth weight during the first 54 days of age.  Average weight gain of 4-7 ounces (113-198 g) per week after age 38  days.  Consistent daily weight gain by age 31 days, without weight loss after the age of 2 weeks. After a feeding, your baby may spit up a small amount. This is common. Breastfeeding frequency and duration Frequent feeding will help you make more milk and can prevent sore nipples and breast engorgement. Breastfeed when you feel the need to reduce the fullness of your breasts or when your baby shows signs of hunger. This is called "breastfeeding on demand." Avoid introducing a pacifier to your baby while you are working to establish breastfeeding (the first 4-6 weeks after your baby is born). After this time you may choose to use a pacifier. Research has shown that pacifier use during the first year of a baby's life decreases the risk of sudden infant death syndrome (SIDS). Allow your baby to feed on each breast as long as he or she wants. Breastfeed until your baby is finished feeding. When your baby unlatches or falls asleep while feeding from the first breast, offer the second breast. Because newborns are often sleepy in the first few weeks of life, you may need to awaken your baby to get him or her to feed. Breastfeeding times will vary from baby to baby. However, the following rules can serve as a guide to help you ensure that your baby is properly fed:  Newborns (babies 3 weeks of age or younger) may breastfeed every 1-3 hours.  Newborns should not go longer than 3 hours during the day or 5 hours during the night without breastfeeding.  You should breastfeed your baby a minimum of 8 times in a 24-hour period until you begin to introduce solid foods to your baby at around 28 months of age. Breast milk pumping Pumping and storing breast milk allows you to ensure that your baby is exclusively fed your breast milk, even at times when you are unable to breastfeed. This is especially important if you are going back to work while you are still breastfeeding or when you are not able to be present during  feedings. Your lactation consultant can give you guidelines on how long it is safe to store breast milk. A breast pump is a machine that allows you to pump milk from your breast into a sterile bottle. The pumped breast milk can then be stored in a refrigerator or freezer. Some breast pumps are operated by hand, while  others use electricity. Ask your lactation consultant which type will work best for you. Breast pumps can be purchased, but some hospitals and breastfeeding support groups lease breast pumps on a monthly basis. A lactation consultant can teach you how to hand express breast milk, if you prefer not to use a pump. Caring for your breasts while you breastfeed Nipples can become dry, cracked, and sore while breastfeeding. The following recommendations can help keep your breasts moisturized and healthy:  Avoid using soap on your nipples.  Wear a supportive bra. Although not required, special nursing bras and tank tops are designed to allow access to your breasts for breastfeeding without taking off your entire bra or top. Avoid wearing underwire-style bras or extremely tight bras.  Air dry your nipples for 3-59minutes after each feeding.  Use only cotton bra pads to absorb leaked breast milk. Leaking of breast milk between feedings is normal.  Use lanolin on your nipples after breastfeeding. Lanolin helps to maintain your skin's normal moisture barrier. If you use pure lanolin, you do not need to wash it off before feeding your baby again. Pure lanolin is not toxic to your baby. You may also hand express a few drops of breast milk and gently massage that milk into your nipples and allow the milk to air dry. In the first few weeks after giving birth, some women experience extremely full breasts (engorgement). Engorgement can make your breasts feel heavy, warm, and tender to the touch. Engorgement peaks within 3-5 days after you give birth. The following recommendations can help ease  engorgement:  Completely empty your breasts while breastfeeding or pumping. You may want to start by applying warm, moist heat (in the shower or with warm water-soaked hand towels) just before feeding or pumping. This increases circulation and helps the milk flow. If your baby does not completely empty your breasts while breastfeeding, pump any extra milk after he or she is finished.  Wear a snug bra (nursing or regular) or tank top for 1-2 days to signal your body to slightly decrease milk production.  Apply ice packs to your breasts, unless this is too uncomfortable for you.  Make sure that your baby is latched on and positioned properly while breastfeeding. If engorgement persists after 48 hours of following these recommendations, contact your health care provider or a Science writer. Overall health care recommendations while breastfeeding  Eat healthy foods. Alternate between meals and snacks, eating 3 of each per day. Because what you eat affects your breast milk, some of the foods may make your baby more irritable than usual. Avoid eating these foods if you are sure that they are negatively affecting your baby.  Drink milk, fruit juice, and water to satisfy your thirst (about 10 glasses a day).  Rest often, relax, and continue to take your prenatal vitamins to prevent fatigue, stress, and anemia.  Continue breast self-awareness checks.  Avoid chewing and smoking tobacco. Chemicals from cigarettes that pass into breast milk and exposure to secondhand smoke may harm your baby.  Avoid alcohol and drug use, including marijuana. Some medicines that may be harmful to your baby can pass through breast milk. It is important to ask your health care provider before taking any medicine, including all over-the-counter and prescription medicine as well as vitamin and herbal supplements. It is possible to become pregnant while breastfeeding. If birth control is desired, ask your health care  provider about options that will be safe for your baby. Contact a health care provider  if:  You feel like you want to stop breastfeeding or have become frustrated with breastfeeding.  You have painful breasts or nipples.  Your nipples are cracked or bleeding.  Your breasts are red, tender, or warm.  You have a swollen area on either breast.  You have a fever or chills.  You have nausea or vomiting.  You have drainage other than breast milk from your nipples.  Your breasts do not become full before feedings by the fifth day after you give birth.  You feel sad and depressed.  Your baby is too sleepy to eat well.  Your baby is having trouble sleeping.  Your baby is wetting less than 3 diapers in a 24-hour period.  Your baby has less than 3 stools in a 24-hour period.  Your baby's skin or the white part of his or her eyes becomes yellow.  Your baby is not gaining weight by 70 days of age. Get help right away if:  Your baby is overly tired (lethargic) and does not want to wake up and feed.  Your baby develops an unexplained fever. This information is not intended to replace advice given to you by your health care provider. Make sure you discuss any questions you have with your health care provider. Document Released: 10/06/2005 Document Revised: 03/19/2016 Document Reviewed: 03/30/2013 Elsevier Interactive Patient Education  2017 [employer].

## 2018-10-20 NOTE — L&D Delivery Note (Signed)
Delivery Note call to room by nursing that ready for delivery, pt had just received ativan dose and fetus at perineum; on arrival into room, cord protruding from vagina; demise of fetus noted previously by nursing  With one push a non-viable female was delivered over intact perineum via svd (Presentation: cephalic with cord presenting first  ).  APGAR: 0,0 ; weight not yet done  .   Placenta status: delivered spontaneously, intact, odor noted.  Cord: 3vv, with the following complications: none .  Anesthesia:  epidural Episiotomy:  none Lacerations:  none Suture Repair: n/a Est. Blood Loss (mL):  142ml  Mom to postpartum.  Baby to remain in room currently then plan to morgue.  Grossly wnl. Parents coping well.  Fever of 100.4 noted prior to delivery and odor c/w chorio, and pt to receive gent/clinda; will plan to contin clinda q 8 hr and gent is 24 hr dosing; plan to monitor for further fever but anticipate being afebrile since now delivered.  Plan to monitor and d/c home in am.  Patient also requesting that she have time to grieve and prefers d/c home tomorrow.  [name] 08/21/2019, 12:45 PM

## 2019-04-01 ENCOUNTER — Other Ambulatory Visit: Payer: Self-pay | Admitting: Obstetrics & Gynecology

## 2019-04-01 DIAGNOSIS — R221 Localized swelling, mass and lump, neck: Secondary | ICD-10-CM

## 2019-04-01 DIAGNOSIS — E039 Hypothyroidism, unspecified: Secondary | ICD-10-CM

## 2019-04-04 ENCOUNTER — Ambulatory Visit
Admission: RE | Admit: 2019-04-04 | Discharge: 2019-04-04 | Disposition: A | Discharge status: Home / Self Care | Payer: Managed Care, Other (non HMO) | Source: Ambulatory Visit | Attending: Obstetrics & Gynecology | Admitting: Obstetrics & Gynecology

## 2019-04-04 DIAGNOSIS — R221 Localized swelling, mass and lump, neck: Secondary | ICD-10-CM

## 2019-04-04 DIAGNOSIS — E039 Hypothyroidism, unspecified: Secondary | ICD-10-CM

## 2019-08-15 ENCOUNTER — Other Ambulatory Visit: Payer: Self-pay

## 2019-08-15 ENCOUNTER — Observation Stay (HOSPITAL_COMMUNITY)
Admission: AD | Admit: 2019-08-15 | Discharge: 2019-08-16 | Disposition: A | Discharge status: Home / Self Care | Payer: Managed Care, Other (non HMO) | Attending: Obstetrics and Gynecology | Admitting: Obstetrics and Gynecology

## 2019-08-15 ENCOUNTER — Encounter (HOSPITAL_COMMUNITY): Payer: Self-pay | Admitting: Student

## 2019-08-15 ENCOUNTER — Inpatient Hospital Stay (HOSPITAL_BASED_OUTPATIENT_CLINIC_OR_DEPARTMENT_OTHER): Payer: Managed Care, Other (non HMO)

## 2019-08-15 DIAGNOSIS — O4290 Premature rupture of membranes, unspecified as to length of time between rupture and onset of labor, unspecified weeks of gestation: Secondary | ICD-10-CM | POA: Diagnosis not present

## 2019-08-15 DIAGNOSIS — O99891 Other specified diseases and conditions complicating pregnancy: Secondary | ICD-10-CM | POA: Diagnosis present

## 2019-08-15 DIAGNOSIS — O09299 Supervision of pregnancy with other poor reproductive or obstetric history, unspecified trimester: Secondary | ICD-10-CM

## 2019-08-15 DIAGNOSIS — O42112 Preterm premature rupture of membranes, onset of labor more than 24 hours following rupture, second trimester: Principal | ICD-10-CM | POA: Insufficient documentation

## 2019-08-15 DIAGNOSIS — I10 Essential (primary) hypertension: Secondary | ICD-10-CM | POA: Diagnosis not present

## 2019-08-15 DIAGNOSIS — Z20828 Contact with and (suspected) exposure to other viral communicable diseases: Secondary | ICD-10-CM | POA: Diagnosis not present

## 2019-08-15 DIAGNOSIS — O42919 Preterm premature rupture of membranes, unspecified as to length of time between rupture and onset of labor, unspecified trimester: Secondary | ICD-10-CM | POA: Diagnosis present

## 2019-08-15 DIAGNOSIS — E039 Hypothyroidism, unspecified: Secondary | ICD-10-CM | POA: Diagnosis not present

## 2019-08-15 DIAGNOSIS — Z3A20 20 weeks gestation of pregnancy: Secondary | ICD-10-CM

## 2019-08-15 HISTORY — DX: Hypothyroidism, unspecified: E03.9

## 2019-08-15 LAB — WET PREP, GENITAL
Clue Cells Wet Prep HPF POC: NONE SEEN
Sperm: NONE SEEN
Trich, Wet Prep: NONE SEEN
Yeast Wet Prep HPF POC: NONE SEEN

## 2019-08-15 LAB — URINALYSIS, ROUTINE W REFLEX MICROSCOPIC
Bilirubin Urine: NEGATIVE
Glucose, UA: NEGATIVE mg/dL
Hgb urine dipstick: NEGATIVE
Ketones, ur: NEGATIVE mg/dL
Nitrite: NEGATIVE
Protein, ur: NEGATIVE mg/dL
Specific Gravity, Urine: 1.01 (ref 1.005–1.030)
pH: 5.5 (ref 5.0–8.0)

## 2019-08-15 LAB — CBC WITH DIFFERENTIAL/PLATELET
Abs Immature Granulocytes: 0.05 K/uL (ref 0.00–0.07)
Basophils Absolute: 0 K/uL (ref 0.0–0.1)
Basophils Relative: 0 %
Eosinophils Absolute: 0.1 K/uL (ref 0.0–0.5)
Eosinophils Relative: 1 %
HCT: 35.2 % — ABNORMAL LOW (ref 36.0–46.0)
Hemoglobin: 12.1 g/dL (ref 12.0–15.0)
Immature Granulocytes: 0 %
Lymphocytes Relative: 20 %
Lymphs Abs: 2.3 K/uL (ref 0.7–4.0)
MCH: 31.3 pg (ref 26.0–34.0)
MCHC: 34.4 g/dL (ref 30.0–36.0)
MCV: 91.2 fL (ref 80.0–100.0)
Monocytes Absolute: 0.7 K/uL (ref 0.1–1.0)
Monocytes Relative: 6 %
Neutro Abs: 8.4 K/uL — ABNORMAL HIGH (ref 1.7–7.7)
Neutrophils Relative %: 73 %
Platelets: 160 K/uL (ref 150–400)
RBC: 3.86 MIL/uL — ABNORMAL LOW (ref 3.87–5.11)
RDW: 12.4 % (ref 11.5–15.5)
WBC: 11.5 K/uL — ABNORMAL HIGH (ref 4.0–10.5)
nRBC: 0 % (ref 0.0–0.2)

## 2019-08-15 LAB — URINALYSIS, MICROSCOPIC (REFLEX): RBC / HPF: NONE SEEN RBC/hpf (ref 0–5)

## 2019-08-15 LAB — SARS CORONAVIRUS 2 BY RT PCR (HOSPITAL ORDER, PERFORMED IN ~~LOC~~ HOSPITAL LAB): SARS Coronavirus 2: NEGATIVE

## 2019-08-15 LAB — POCT FERN TEST: POCT Fern Test: POSITIVE

## 2019-08-15 MED ORDER — ONDANSETRON 4 MG PO TBDP
4.0000 mg | ORAL_TABLET | Freq: Four times a day (QID) | ORAL | Status: DC | PRN
  Administered 2019-08-15 – 2019-08-16 (×2): 4 mg via ORAL
  Filled 2019-08-15 (×2): qty 1

## 2019-08-15 MED ORDER — ZOLPIDEM TARTRATE 5 MG PO TABS
5.0000 mg | ORAL_TABLET | Freq: Every evening | ORAL | Status: DC | PRN
  Administered 2019-08-15: 5 mg via ORAL
  Filled 2019-08-15: qty 1

## 2019-08-15 MED ORDER — DOCUSATE SODIUM 100 MG PO CAPS
100.0000 mg | ORAL_CAPSULE | Freq: Every day | ORAL | Status: DC
  Filled 2019-08-15: qty 1

## 2019-08-15 MED ORDER — CALCIUM CARBONATE ANTACID 500 MG PO CHEW: 2.0000 | CHEWABLE_TABLET | ORAL | Status: DC | PRN

## 2019-08-15 MED ORDER — ACETAMINOPHEN 325 MG PO TABS
650.0000 mg | ORAL_TABLET | ORAL | Status: DC | PRN
  Administered 2019-08-15 – 2019-08-16 (×3): 650 mg via ORAL
  Filled 2019-08-15 (×3): qty 2

## 2019-08-15 MED ORDER — DOCUSATE SODIUM 100 MG PO CAPS: 100.0000 mg | ORAL_CAPSULE | Freq: Every day | ORAL | Status: DC

## 2019-08-15 MED ORDER — PRENATAL MULTIVITAMIN CH: 1.0000 | ORAL_TABLET | Freq: Every day | ORAL | Status: DC

## 2019-08-15 NOTE — H&P (Signed)
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  She denies any vaginal bleeding, contractions.   Antepartum course: uncomplicated except for vaginal bleeding likely d/t cervical polyp, resolved; hypothyroid - synthroid; h/o pre-e and has been taking baby asa q day PNCare at [place] since 11 wks.  See complete pre-natal records  History OB History    Gravida  4   Para  2   Term  2   Preterm  0   AB  1   Living  2     SAB  1   TAB  0   Ectopic  0   Multiple  0   Live Births  2          Past Medical History:  Diagnosis Date  . Anxiety   . GERD (gastroesophageal reflux disease)   . Gestational thrombocytopenia without hemorrhage ([place]) 10/06/2016  . Hypertension    PIH with last preg  . Hypothyroidism   . IBS (irritable bowel syndrome)   . Kidney stones   . Migraines    as a child  . Pregnancy induced hypertension    Past Surgical History:  Procedure Laterality Date  . BASAL CELL CARCINOMA EXCISION    . DENTAL SURGERY     Family History: family history includes Cancer in her father, paternal grandfather, and paternal grandmother; Diabetes in her maternal uncle; Heart attack in her maternal grandfather and maternal grandmother; Hypertension in her brother, father, maternal grandfather, maternal grandmother, mother, paternal grandfather, and paternal grandmother. Social History:  reports that she has never smoked. She has never used smokeless tobacco. She reports previous alcohol use of about 3.0 - 4.0 standard drinks of alcohol per week. She reports that she does not use drugs.  ROS: See above otherwise negative  Prenatal labs:  ABO, Rh:  rh pos Antibody:  neg Rubella:  immune RPR:   neg HBsAg:   neg HIV:  neg GBS:   not done 1 hr Glucola: n/a Genetic screening: Normal Anatomy US: Normal  Physical Exam:   Dilation: Closed(visually closed) Exam by:: Robyne Askew,  NP  Temp:  [98.7 F (37.1 C)] 98.7 F (37.1 C) (10/26 1742) Pulse Rate:  [84-94] 84 (10/26 1846) Resp:  [16] 16 (10/26 1742) BP: (118-135)/(68-95) 121/68 (10/26 1846)   A&O x 3 HEENT: Normal Lungs: CTAB CV: RRR Abdominal: Soft, Non-tender and Gravid Lower Extremities: Non-edematous, Non-tender  Pelvic Exam: Deferred; see MAU provider note Pooling noted  Labs:  CBC:  Lab Results  Component Value Date   WBC 11.5 (H) 08/15/2019   RBC 3.86 (L) 08/15/2019   HGB 12.1 08/15/2019   HCT 35.2 (L) 08/15/2019   MCV 91.2 08/15/2019   MCH 31.3 08/15/2019   MCHC 34.4 08/15/2019   RDW 12.4 08/15/2019   PLT 160 08/15/2019   CMP:  Lab Results  Component Value Date   NA 135 10/04/2016   K 4.0 10/04/2016   CL 108 10/04/2016   CO2 17 (L) 10/04/2016   GLUCOSE 112 (H) 10/04/2016   BUN 12 10/04/2016   CREATININE 0.78 10/04/2016   CALCIUM 8.4 (L) 10/04/2016   PROT 6.9 10/04/2016   AST 15 10/04/2016   ALT 11 (L) 10/04/2016   ALBUMIN 2.7 (L) 10/04/2016   ALKPHOS 154 (H) 10/04/2016   BILITOT 0.2 (L) 10/04/2016   GFRNONAA >60 10/04/2016   GFRAA >60 10/04/2016   ANIONGAP 10 10/04/2016   Urine:  Lab Results  Component Value Date   COLORURINE YELLOW 08/15/2019   APPEARANCEUR CLEAR 08/15/2019   LABSPEC 1.010 08/15/2019   PHURINE 5.5 08/15/2019   GLUCOSEU NEGATIVE 08/15/2019   HGBUR NEGATIVE 08/15/2019   BILIRUBINUR NEGATIVE 08/15/2019   KETONESUR NEGATIVE 08/15/2019   PROTEINUR NEGATIVE 08/15/2019   NITRITE NEGATIVE 08/15/2019   LEUKOCYTESUR MODERATE (A) 08/15/2019     Prenatal Transfer Tool  Maternal Diabetes: No Genetic Screening: Normal Maternal Ultrasounds/Referrals: Normal Fetal Ultrasounds or other Referrals:  None Maternal Substance Abuse:  No Significant Maternal Medications:  Meds include: Syntroid Significant Maternal Lab Results: Other: fern positive  U/S: cephalic, fht 0000000, afi 3cm (oligohydramnios) Fern positive  Assessment/Plan:  33 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 ROM - I have reviewed the diagnosis with the patient.  She is grossly ruptured and fern positive.  She understands that at this gestational age the fetus is not viable, and that viability is not until 35 wga.  At this time we can proceed with IOL with cytotec and this process reviewed.  Initially she was asking about D&E when she needs to be delivered, but after discussing this option she will not likely pursue this.  She also understands expectant management as an option with close f/u and monitoring with need for delivery if absent FHT or signs/symptoms of chorioamnionitis.  The patient understands that if she is not wanting IOL tonight, that she can have expectant management at home, but she is requesting to stay in house for observation - this is reasonable d/t slightly elevated WBC.  At this time, will admit for observation and repeat CBC in am.  We have discussed that amniotic fluid is necessary for fetal lung development and poor prognosis for baby despite having small amount of fluid, even if she did not have a medical indication for delivery (chorio, absent cardiac activity) and made it to 24 wga.  If did make it this far, could give steroids and latency abx.  Offered MFM consult and she would like to pursue this to help her in her management decisions.  At this time, no clear benefit for latency abx and could mask developing chorio  2. Hypothyroid - stable on synthroid 3. H/o svd x2 4. Obesity   [name] 08/15/2019, 9:41 PM

## 2019-08-15 NOTE — MAU Provider Note (Signed)
Chief Complaint: Rupture of Membranes   First Provider Initiated Contact with Patient 08/15/19 1818     SUBJECTIVE HPI: Katherine Marshall is a 33 y.o. [id number] at [redacted]w[redacted]d who presents to Maternity Admissions reporting LOF. States had a large gush of clear fluid at 5 pm. Leaking has continued since then. No odor to leaking. Denies abdominal pain, fever/chills, or vaginal bleeding. Normal fetal movement. Goes to [facility] & denies complications with this pregnancy. Reports a normal anatomy scan last week.    Past Medical History:  Diagnosis Date  . Anxiety   . GERD (gastroesophageal reflux disease)   . Gestational thrombocytopenia without hemorrhage ([place]) 10/06/2016  . Hypertension    PIH with last preg  . Hypothyroidism   . IBS (irritable bowel syndrome)   . Kidney stones   . Migraines    as a child  . Pregnancy induced hypertension    OB History  Gravida Para Term Preterm AB Living  4 2 2  0 1 2  SAB TAB Ectopic Multiple Live Births  1 0 0 0 2    # Outcome Date GA Lbr Len/2nd Weight Sex Delivery Anes PTL Lv  4 Current           3 Term 10/04/16 [redacted]w[redacted]d 02:10 / 00:28 2741 g M Vag-Spont EPI  LIV     Birth Comments: none  2 Term 04/14/14 [redacted]w[redacted]d -13:15 / 02:10 2410 g M Vag-Spont EPI  LIV     Complications: Preeclampsia  1 SAB            Past Surgical History:  Procedure Laterality Date  . BASAL CELL CARCINOMA EXCISION    . DENTAL SURGERY     Social History   Socioeconomic History  . Marital status: Married    Spouse name: Not on file  . Number of children: Not on file  . Years of education: Not on file  . Highest education level: Not on file  Occupational History  . Not on file  Social Needs  . Financial resource strain: Not on file  . Food insecurity    Worry: Not on file    Inability: Not on file  . Transportation needs    Medical: Not on file    Non-medical: Not on file  Tobacco Use  . Smoking status: Never Smoker  . Smokeless tobacco: Never Used  Substance and  Sexual Activity  . Alcohol use: Not Currently    Alcohol/week: 3.0 - 4.0 standard drinks    Types: 3 - 4 Standard drinks or equivalent per week    Comment: occ glass of wine- not during pregnancy  . Drug use: No  . Sexual activity: Yes    Partners: Male    Birth control/protection: None  Lifestyle  . Physical activity    Days per week: Not on file    Minutes per session: Not on file  . Stress: Not on file  Relationships  . Social Herbalist on phone: Not on file    Gets together: Not on file    Attends religious service: Not on file    Active member of club or organization: Not on file    Attends meetings of clubs or organizations: Not on file    Relationship status: Not on file  . Intimate partner violence    Fear of current or ex partner: Not on file    Emotionally abused: Not on file    Physically abused: Not on file  Forced sexual activity: Not on file  Other Topics Concern  . Not on file  Social History Narrative  . Not on file   Family History  Problem Relation Age of Onset  . Cancer Father        thyroid  . Hypertension Father   . Cancer Paternal Grandmother        colon  . Hypertension Paternal Grandmother   . Hypertension Mother   . Hypertension Brother   . Hypertension Maternal Grandmother   . Heart attack Maternal Grandmother   . Hypertension Maternal Grandfather   . Heart attack Maternal Grandfather   . Cancer Paternal Grandfather        brain  . Hypertension Paternal Grandfather   . Diabetes Maternal Uncle   . Hearing loss Neg Hx    No current facility-administered medications on file prior to encounter.    Current Outpatient Medications on File Prior to Encounter  Medication Sig Dispense Refill  . aspirin EC 81 MG tablet Take 81 mg by mouth daily.    Marland Kitchen levothyroxine (SYNTHROID) 75 MCG tablet Take 75 mcg by mouth daily before breakfast.    . Prenatal MV-Min-FA-Omega-3 (PRENATAL GUMMIES/DHA & FA) 0.4-32.5 MG CHEW Chew 2 each by mouth  every evening.    Marland Kitchen acetaminophen (TYLENOL) 500 MG tablet Take 1,000 mg by mouth every 6 (six) hours as needed for mild pain, moderate pain or headache.     . ibuprofen (ADVIL,MOTRIN) 600 MG tablet Take 1 tablet (600 mg total) by mouth every 6 (six) hours. 30 tablet 0  . lansoprazole (PREVACID) 30 MG capsule Take 30 mg by mouth every evening.     No Known Allergies  I have reviewed patient's Past Medical Hx, Surgical Hx, Family Hx, Social Hx, medications and allergies.   Review of Systems  Constitutional: Negative.   Gastrointestinal: Negative.   Genitourinary: Positive for vaginal discharge. Negative for dysuria and vaginal bleeding.    OBJECTIVE Patient Vitals for the past 24 hrs:  BP Temp Pulse Resp  08/15/19 1846 121/68 - 84 -  08/15/19 1838 122/84 - 94 -  08/15/19 1818 118/77 - 91 -  08/15/19 1742 (!) 135/95 98.7 F (37.1 C) 94 16   Constitutional: Well-developed, well-nourished female in no acute distress.  Cardiovascular: normal rate & rhythm, no murmur Respiratory: normal rate and effort. Lung sounds clear throughout GI: Abd soft, non-tender, Pos BS x 4. No guarding or rebound tenderness MS: Extremities nontender, no edema, normal ROM Neurologic: Alert and oriented x 4.  GU:    SSE: small amount of clear fluid pooling in vagina. Cervix visually closed. No blood.    LAB RESULTS Results for orders placed or performed during the hospital encounter of 08/15/19 (from the past 24 hour(s))  POCT fern test     Status: None   Collection Time: 08/15/19  6:24 PM  Result Value Ref Range   POCT Fern Test Positive = ruptured amniotic membanes   Wet prep, genital     Status: Abnormal   Collection Time: 08/15/19  6:36 PM   Specimen: PATH Cytology Cervicovaginal Ancillary Only  Result Value Ref Range   Yeast Wet Prep HPF POC NONE SEEN NONE SEEN   Trich, Wet Prep NONE SEEN NONE SEEN   Clue Cells Wet Prep HPF POC NONE SEEN NONE SEEN   WBC, Wet Prep HPF POC MANY (A) NONE SEEN    Sperm NONE SEEN   CBC with Differential/Platelet     Status: Abnormal  Collection Time: 08/15/19  7:00 PM  Result Value Ref Range   WBC 11.5 (H) 4.0 - 10.5 K/uL   RBC 3.86 (L) 3.87 - 5.11 MIL/uL   Hemoglobin 12.1 12.0 - 15.0 g/dL   HCT 35.2 (L) 36.0 - 46.0 %   MCV 91.2 80.0 - 100.0 fL   MCH 31.3 26.0 - 34.0 pg   MCHC 34.4 30.0 - 36.0 g/dL   RDW 12.4 11.5 - 15.5 %   Platelets 160 150 - 400 K/uL   nRBC 0.0 0.0 - 0.2 %   Neutrophils Relative % 73 %   Neutro Abs 8.4 (H) 1.7 - 7.7 K/uL   Lymphocytes Relative 20 %   Lymphs Abs 2.3 0.7 - 4.0 K/uL   Monocytes Relative 6 %   Monocytes Absolute 0.7 0.1 - 1.0 K/uL   Eosinophils Relative 1 %   Eosinophils Absolute 0.1 0.0 - 0.5 K/uL   Basophils Relative 0 %   Basophils Absolute 0.0 0.0 - 0.1 K/uL   Immature Granulocytes 0 %   Abs Immature Granulocytes 0.05 0.00 - 0.07 K/uL    IMAGING No results found.  MAU COURSE Orders Placed This Encounter  Procedures  . Wet prep, genital  . [place] MFM OB LIMITED  . CBC with Differential/Platelet  . Urinalysis, Routine w reflex microscopic  . POCT fern test   No orders of the defined types were placed in this encounter.   MDM FHT present via doppler  + pooling on sterile speculum exam & fern positive  C/w Dr. Ilda Basset. Will get CBC & 30 mins of TOCO. U/a pending.   Notified of Dr. Murrell Redden of patient's presentation & exam. Limited ob ultrasound ordered for AFI & presentation. Dr. Murrell Redden will come speak with patient when she returns from ultrasound.   ASSESSMENT 1. Amniotic fluid leaking   2. [redacted] weeks gestation of pregnancy     PLAN OB ultrasound pending Dr. Murrell Redden to see patient & take over management   Jorje Guild, NP 08/15/2019  7:47 PM

## 2019-08-15 NOTE — MAU Note (Signed)
.   Katherine Marshall is a 33 y.o. at [redacted]w[redacted]d here in MAU reporting: a large gush of fluid that started at 5pm today. Denies any VB or pain LMP:  Onset of complaint: 5pm Pain score: 0 Vitals:   08/15/19 1742  BP: (!) 135/95  Pulse: 94  Resp: 16  Temp: 98.7 F (37.1 C)     FHT:155 Lab orders placed from triage:

## 2019-08-15 NOTE — Plan of Care (Signed)
  Problem: Education: Goal: Knowledge of General Education information will improve Description: Including pain rating scale, medication(s)/side effects and non-pharmacologic comfort measures Outcome: Completed/Met

## 2019-08-16 DIAGNOSIS — Z3A20 20 weeks gestation of pregnancy: Secondary | ICD-10-CM | POA: Diagnosis not present

## 2019-08-16 DIAGNOSIS — O42912 Preterm premature rupture of membranes, unspecified as to length of time between rupture and onset of labor, second trimester: Secondary | ICD-10-CM

## 2019-08-16 LAB — CBC
HCT: 34.4 % — ABNORMAL LOW (ref 36.0–46.0)
Hemoglobin: 11.4 g/dL — ABNORMAL LOW (ref 12.0–15.0)
MCH: 30.6 pg (ref 26.0–34.0)
MCHC: 33.1 g/dL (ref 30.0–36.0)
MCV: 92.5 fL (ref 80.0–100.0)
Platelets: 156 10*3/uL (ref 150–400)
RBC: 3.72 MIL/uL — ABNORMAL LOW (ref 3.87–5.11)
RDW: 12.4 % (ref 11.5–15.5)
WBC: 9.2 10*3/uL (ref 4.0–10.5)
nRBC: 0 % (ref 0.0–0.2)

## 2019-08-16 LAB — TYPE AND SCREEN
ABO/RH(D): A POS
Antibody Screen: NEGATIVE

## 2019-08-16 LAB — ABO/RH: ABO/RH(D): A POS

## 2019-08-16 MED ORDER — LEVOTHYROXINE SODIUM 25 MCG PO TABS
75.0000 ug | ORAL_TABLET | Freq: Every day | ORAL | Status: DC
  Administered 2019-08-16: 75 ug via ORAL
  Filled 2019-08-16: qty 3

## 2019-08-16 NOTE — Consult Note (Signed)
Maternal-Fetal Medicine (Follow-up consultation)  Name: Katherine Marshall MRN: [id number] Requesting Provider: Tiana Loft, MD  Ms. Katherine Marshall, [place] at 20w 5d gestation, was admitted yesterday with diagnosis of PPROM. Patient had a gush of amniotic fluid leakage yesterday at 5 PM. She was examined at the MAU and pooling of amniotic fluid on speculum examination confirmed the diagnosis. She does not have fever or abdominal pain or vaginal bleeding.  Her prenatal course was, otherwise, uneventful. She reports she had low risks for fetal aneuploidies on cell-free fetal DNA screening.  Obstetric history: -2015: Term vaginal delivery of a female infant weighing 2,410 g at birth. Her pregnancy was uncomplicated. -2017: Term vaginal delivery of a female infant weighing 2,741 g at birth. Her pregnancy was complicated by gestational hypertension close to delivery. In addition, she had one early spontaneous miscarriage (first pregnancy). Gyn history: No history of abnormal Pap smears or cervical surgeries.  Her menstrual cycles have been regular.  No history of breast disease. Past medical history: She has hypothyroidism and takes levothyroxine supplements.  She does not have diabetes or hypertension or any other chronic medical conditions. Past surgical history: Mohs surgery (basal cell carcinoma) in 2015. Medications: Prenatal vitamins, aspirin, levothyroxine 75 mcg/day. Allergies: No known drug allergies. Social history: Denies tobacco drug or alcohol use.  She has been married 7 years and her husband is in good health. He was present at the time of consultation. Family history: No history of venous thromboembolism in the family.  P/E: Patient is comfortably lying in bed; not in distress. Vitals stable HEENT: Normal. Abdomen: Soft gravid uterus, no tenderness in any of the quadrants. No pedal edema.  On ultrasound performed yesterday, oligohydramnios (maximum vertical pocket was 3 cm) was seen.   Cephalic presentation.  Good fetal heart activity seen.  Placenta is anterior.  Detailed anatomical survey was not performed.  On transabdominal scan, the cervix was long and closed.  Labs: WBC 11.5, hemoglobin 12.1, hematocrit 35.2, platelets 160.  SARS-CoV-2 negative.  I counseled the patient on the following: PPROM before viability: -About 50% deliver between 1 to 2 weeks after PPROM and about 85% deliver within a month.  -Lung development takes place between 48- and 24-weeks' gestation. Pulmonary hypoplasia can occur if PPROM occurs early in gestation. Pulmonary hypoplasia cannot be predicted on ultrasound. If the newborn has severe pulmonary hypoplasia, there is increased postnatal mortality. One would expect a 20% chance of having pulmonary hypoplasia after ROM at 20 weeks.  -Other complications include maternal/fetal infections, placental abruption, cord prolapse, fetal demise and limb contractures (reversible).  -Expectant management as an inpatient from 23 to 24 weeks. Discussed the benefit of antenatal corticosteroids and prophylactic antibiotics (from 23 to 24 weeks).  -Outpatient management before viability is recommended. I advised the patient to abstain from sexual intercourse, vaginal examination and insertion of tampons.  -It is unlikely antibiotics will help at this gestational age and no clear recommendation exists.  -If expectant management is successful, we recommend delivery at 34 weeks as risk from intraamniotic infection outweighs the neonatal benefits at that gestational age.  -Neonatal outcomes are gestational-age dependent. If delivery occurs before 28 weeks' gestation, prematurity complications are higher. Poor neurodevelopmental outcomes including cerebral palsy are higher.  -Delivery would be recommended regardless of gestational age if signs of maternal/fetal infections are present.  -Termination of pregnancy is an option if mother perceives increased maternal or  fetal complications.  Recommendations: -If expectant management is chosen, patient can be discharged home with instructions. -Antibiotics  are not indicated now. -Readmit at 23 weeks' gestation and antibiotics and antenatal corticosteroids at 23 weeks. -Inpatient management from 23 weeks till delivery. -Neonatal consultation at 23 to 24 weeks.   Thank you for your consult. Please do not hesitate to contact me if you have any questions or concerns. Consultation including face-to-face counseling: 40 min.

## 2019-08-16 NOTE — Discharge Summary (Signed)
Physician Discharge Summary  Patient ID: Katherine Marshall MRN: [id number] DOB/AGE: May 28, 1986 33 y.o.  Admit date: 08/15/2019 Discharge date: 08/16/2019  Admission Diagnoses:PPROM at 20.5 wga  Discharge Diagnoses:  Active Problems:   Preterm premature rupture of membranes (PPROM) with unknown onset of labor   Discharged Condition: stable  Hospital Course: The patient was admitted for observation after PPROM at 20.4 wga.  She was not contracting, afebrile, but slight elevated in WBC.  The patient also requested observation and was afraid to go home.  The patient has had an uncomplicated course and now normal WBC on repeat lab.  She is asymptomatic.  She has been given options for IOL vs expectant management and has had an MFM consult.  She is confident in her decision and is ready for d/c home.  She has precautions for labor/chorio/bleeding.  FHT noted on u/s and with dopplar this am.  Consults: MFM  Significant Diagnostic Studies: pool and fern positive, cbc normal this am  Treatments: observation  Discharge Exam: Blood pressure 109/64, pulse 85, temperature 98.6 F (37 C), temperature source Oral, resp. rate 18, height 5' (1.524 m), weight 90.7 kg, last menstrual period 03/24/2019, SpO2 98 %, unknown if currently breastfeeding. See progress ote  Disposition: stable   Allergies as of 08/16/2019   No Known Allergies     Medication List    STOP taking these medications   aspirin EC 81 MG tablet   ibuprofen 600 MG tablet Commonly known as: ADVIL     TAKE these medications   acetaminophen 500 MG tablet Commonly known as: TYLENOL Take 1,000 mg by mouth every 6 (six) hours as needed for mild pain, moderate pain or headache.   doxylamine (Sleep) 25 MG tablet Commonly known as: UNISOM Take 12.5 mg by mouth at bedtime as needed for sleep.   lansoprazole 30 MG capsule Commonly known as: PREVACID Take 30 mg by mouth every evening.   levothyroxine 75 MCG tablet Commonly  known as: SYNTHROID Take 75 mcg by mouth daily before breakfast.   Prenatal Gummies/DHA & FA 0.4-32.5 MG Chew Chew 2 each by mouth every evening.      Follow-up Information    Azucena Fallen, MD Follow up.   Specialty: Obstetrics and Gynecology Contact information: [address] [place] [address] [phone]           Signed: [name] 08/16/2019, 12:20 PM

## 2019-08-16 NOTE — H&P (Deleted)
The patient denies any vaginal bleeding, contractions; was crying all morning but "doing ok"  Temp:  [97.5 F (36.4 C)-98.7 F (37.1 C)] 98.6 F (37 C) (10/27 1121) Pulse Rate:  [74-94] 85 (10/27 1121) Resp:  [16-18] 18 (10/27 1121) BP: (95-135)/(57-95) 109/64 (10/27 1121) SpO2:  [98 %-100 %] 98 % (10/27 1121) Weight:  [90.7 kg] 90.7 kg (10/26 2220)  A&ox3 Normal respirations Abd: soft, nt, gravid LE: no edema, nt bilat  CBC Latest Ref Rng & Units 08/16/2019 08/15/2019 10/04/2016  WBC 4.0 - 10.5 K/uL 9.2 11.5(H) 22.1(H)  Hemoglobin 12.0 - 15.0 g/dL 11.4(L) 12.1 10.8(L)  Hematocrit 36.0 - 46.0 % 34.4(L) 35.2(L) 32.9(L)  Platelets 150 - 400 K/uL 156 160 149(L)   A/P: 33 y/o G4P2012 at 20.5 wga with ROM 1.  Stable - improved wbc and asymptomatic; s/p MFM consult; the patient, her husband, and I discussed her current clinical situation and diagnosis, the information discussed with Dr Donalee Citrin, and questions answered to help her in her decision making; the patient understands the options for IOL vs expectant management.  IOL process reviewed with plan for early epidural, manage anxiety prn, she and husband would determine when and how long they would like to see the baby, etc - she also understands that this will be the plan weither IOL now or if needed d/t fetal demise, chorio, etc.  We also discussed expectant management in detail and what to anticipate re: scheduled FHT check/office visits, precautions for chorio/bleeding/labor.  The patient and husband have good understanding of poor prognosis for survival and also importance of amniotic fluid in development of fetal lungs.  At this time, they are certain that they cannot proceed with IOL at this time since +cardiac activity.  They have reasonable expectations with expectant management and prepared to deal with what transpires (fetal demise prior to viability, neonatal death d/t immature lungs, risk of neurological deficits/etc).  Plan f/u in  office in 2-3 days and will make office f/u plan with primary provider.  Will not plan for abx at this time.  Would plan for readmission 23-24 wga for steroids/latency abx if not delivered by then 2. Hypothyroid - synthroid 3. FHT 148

## 2019-08-16 NOTE — Progress Notes (Signed)
The patient denies any vaginal bleeding, contractions; was crying all morning but "doing ok"  Temp:  [97.5 F (36.4 C)-98.7 F (37.1 C)] 98.6 F (37 C) (10/27 1121) Pulse Rate:  [74-94] 85 (10/27 1121) Resp:  [16-18] 18 (10/27 1121) BP: (95-135)/(57-95) 109/64 (10/27 1121) SpO2:  [98 %-100 %] 98 % (10/27 1121) Weight:  [90.7 kg] 90.7 kg (10/26 2220)  A&ox3 Normal respirations Abd: soft, nt, gravid LE: no edema, nt bilat  CBC Latest Ref Rng & Units 08/16/2019 08/15/2019 10/04/2016  WBC 4.0 - 10.5 K/uL 9.2 11.5(H) 22.1(H)  Hemoglobin 12.0 - 15.0 g/dL 11.4(L) 12.1 10.8(L)  Hematocrit 36.0 - 46.0 % 34.4(L) 35.2(L) 32.9(L)  Platelets 150 - 400 K/uL 156 160 149(L)   A/P: 33 y/o G4P2012 at 20.5 wga with ROM 1.  Stable - improved wbc and asymptomatic; s/p MFM consult; the patient, her husband, and I discussed her current clinical situation and diagnosis, the information discussed with Dr Donalee Citrin, and questions answered to help her in her decision making; the patient understands the options for IOL vs expectant management.  IOL process reviewed with plan for early epidural, manage anxiety prn, she and husband would determine when and how long they would like to see the baby, etc - she also understands that this will be the plan weither IOL now or if needed d/t fetal demise, chorio, etc.  We also discussed expectant management in detail and what to anticipate re: scheduled FHT check/office visits, precautions for chorio/bleeding/labor.  The patient and husband have good understanding of poor prognosis for survival and also importance of amniotic fluid in development of fetal lungs.  At this time, they are certain that they cannot proceed with IOL at this time since +cardiac activity.  They have reasonable expectations with expectant management and prepared to deal with what transpires (fetal demise prior to viability, neonatal death d/t immature lungs, risk of neurological deficits/etc).  Plan f/u in  office in 2-3 days and will make office f/u plan with primary provider.  Will not plan for abx at this time.  Would plan for readmission 23-24 wga for steroids/latency abx if not delivered by then 2. Hypothyroid - synthroid 3. FHT 148

## 2019-08-17 LAB — GC/CHLAMYDIA PROBE AMP (~~LOC~~) NOT AT ARMC
Chlamydia: NEGATIVE
Comment: NEGATIVE
Comment: NORMAL
Neisseria Gonorrhea: NEGATIVE

## 2019-08-20 ENCOUNTER — Other Ambulatory Visit: Payer: Self-pay

## 2019-08-20 ENCOUNTER — Inpatient Hospital Stay (HOSPITAL_COMMUNITY): Payer: Managed Care, Other (non HMO)

## 2019-08-20 ENCOUNTER — Inpatient Hospital Stay (HOSPITAL_COMMUNITY)
Admission: AD | Admit: 2019-08-20 | Discharge: 2019-08-22 | DRG: 805 | Disposition: A | Discharge status: Home / Self Care | Payer: Managed Care, Other (non HMO) | Attending: Obstetrics and Gynecology | Admitting: Obstetrics and Gynecology

## 2019-08-20 ENCOUNTER — Encounter (HOSPITAL_COMMUNITY): Payer: Self-pay

## 2019-08-20 DIAGNOSIS — O99284 Endocrine, nutritional and metabolic diseases complicating childbirth: Secondary | ICD-10-CM | POA: Diagnosis present

## 2019-08-20 DIAGNOSIS — O42912 Preterm premature rupture of membranes, unspecified as to length of time between rupture and onset of labor, second trimester: Principal | ICD-10-CM | POA: Diagnosis present

## 2019-08-20 DIAGNOSIS — F419 Anxiety disorder, unspecified: Secondary | ICD-10-CM | POA: Diagnosis present

## 2019-08-20 DIAGNOSIS — O09292 Supervision of pregnancy with other poor reproductive or obstetric history, second trimester: Secondary | ICD-10-CM

## 2019-08-20 DIAGNOSIS — E039 Hypothyroidism, unspecified: Secondary | ICD-10-CM | POA: Diagnosis present

## 2019-08-20 DIAGNOSIS — Z3A21 21 weeks gestation of pregnancy: Secondary | ICD-10-CM

## 2019-08-20 DIAGNOSIS — O42919 Preterm premature rupture of membranes, unspecified as to length of time between rupture and onset of labor, unspecified trimester: Secondary | ICD-10-CM | POA: Diagnosis present

## 2019-08-20 DIAGNOSIS — N949 Unspecified condition associated with female genital organs and menstrual cycle: Secondary | ICD-10-CM

## 2019-08-20 DIAGNOSIS — Z23 Encounter for immunization: Secondary | ICD-10-CM

## 2019-08-20 DIAGNOSIS — Z20828 Contact with and (suspected) exposure to other viral communicable diseases: Secondary | ICD-10-CM | POA: Diagnosis present

## 2019-08-20 DIAGNOSIS — O99344 Other mental disorders complicating childbirth: Secondary | ICD-10-CM | POA: Diagnosis present

## 2019-08-20 DIAGNOSIS — O411220 Chorioamnionitis, second trimester, not applicable or unspecified: Secondary | ICD-10-CM | POA: Diagnosis present

## 2019-08-20 DIAGNOSIS — O26892 Other specified pregnancy related conditions, second trimester: Secondary | ICD-10-CM

## 2019-08-20 LAB — CBC
HCT: 35.9 % — ABNORMAL LOW (ref 36.0–46.0)
Hemoglobin: 12.2 g/dL (ref 12.0–15.0)
MCH: 31 pg (ref 26.0–34.0)
MCHC: 34 g/dL (ref 30.0–36.0)
MCV: 91.1 fL (ref 80.0–100.0)
Platelets: 152 10*3/uL (ref 150–400)
RBC: 3.94 MIL/uL (ref 3.87–5.11)
RDW: 12.3 % (ref 11.5–15.5)
WBC: 17.5 10*3/uL — ABNORMAL HIGH (ref 4.0–10.5)
nRBC: 0 % (ref 0.0–0.2)

## 2019-08-20 LAB — TYPE AND SCREEN
ABO/RH(D): A POS
Antibody Screen: NEGATIVE

## 2019-08-20 MED ORDER — OXYTOCIN BOLUS FROM INFUSION
500.0000 mL | Freq: Once | INTRAVENOUS | Status: DC
  Administered 2019-08-21: 13:00:00 500 mL via INTRAVENOUS

## 2019-08-20 MED ORDER — FENTANYL CITRATE (PF) 100 MCG/2ML IJ SOLN
100.0000 ug | INTRAMUSCULAR | Status: DC | PRN
  Administered 2019-08-21: 100 ug via INTRAVENOUS
  Filled 2019-08-20 (×2): qty 2

## 2019-08-20 MED ORDER — LACTATED RINGERS IV SOLN: 500.0000 mL | INTRAVENOUS | Status: DC | PRN

## 2019-08-20 MED ORDER — OXYCODONE-ACETAMINOPHEN 5-325 MG PO TABS: 1.0000 | ORAL_TABLET | ORAL | Status: DC | PRN

## 2019-08-20 MED ORDER — OXYTOCIN 10 UNIT/ML IJ SOLN
10.0000 [IU] | Freq: Once | INTRAMUSCULAR | Status: DC | PRN
  Filled 2019-08-20: qty 1

## 2019-08-20 MED ORDER — LACTATED RINGERS IV SOLN
INTRAVENOUS | Status: DC
  Administered 2019-08-20: via INTRAVENOUS

## 2019-08-20 MED ORDER — SOD CITRATE-CITRIC ACID 500-334 MG/5ML PO SOLN: 30.0000 mL | ORAL | Status: DC | PRN

## 2019-08-20 MED ORDER — ACETAMINOPHEN 325 MG PO TABS
650.0000 mg | ORAL_TABLET | ORAL | Status: DC | PRN
  Administered 2019-08-21: 650 mg via ORAL
  Filled 2019-08-20: qty 2

## 2019-08-20 MED ORDER — LIDOCAINE HCL (PF) 1 % IJ SOLN: 30.0000 mL | INTRAMUSCULAR | Status: DC | PRN

## 2019-08-20 MED ORDER — ONDANSETRON HCL 4 MG/2ML IJ SOLN
4.0000 mg | Freq: Four times a day (QID) | INTRAMUSCULAR | Status: DC | PRN
  Administered 2019-08-21: 4 mg via INTRAVENOUS
  Filled 2019-08-20: qty 2

## 2019-08-20 MED ORDER — OXYTOCIN 40 UNITS IN NORMAL SALINE INFUSION - SIMPLE MED
2.5000 [IU]/h | INTRAVENOUS | Status: DC
  Administered 2019-08-21: 2.5 [IU]/h via INTRAVENOUS
  Filled 2019-08-20: qty 1000

## 2019-08-20 MED ORDER — OXYCODONE-ACETAMINOPHEN 5-325 MG PO TABS: 2.0000 | ORAL_TABLET | ORAL | Status: DC | PRN

## 2019-08-20 NOTE — MAU Note (Addendum)
At 2100 started feeling pressure and states she feels "as if there's something right there."  Upon external visual exam by RN, no parts observed.  No VB or contractions.  Had a cervical exam in the office yesterday-cervix closed.  Also had an U/S- very little fluid around baby but good FHT, baby is breech.

## 2019-08-20 NOTE — MAU Provider Note (Signed)
History     CSN: [id number]  Arrival date and time: 08/20/19 2117   First Provider Initiated Contact with Patient 08/20/19 2141      Chief Complaint  Patient presents with  . Vaginal Discharge   HPI  Ms.  Katherine Marshall is a 33 y.o. year old G38P2012 female at [redacted]w[redacted]d weeks gestation who presents to MAU via EMS reporting increased pelvic pressure while urinating in the BR and wiping at ~2100. She states that she felt "like something out." She PPROM'd on Monday 08/15/2019. She was admitted to [facility] for 24 hrs observation and then d/c'd home on Tuesday 08/16/2019 with the plan of expectant management. She was seen at [facility] on Friday 08/19/2019. She had an u/s: presentation was breech with 1 cm of amniotic fluid, her cervix was closed. She denies any pain or bleeding at this time. She reports "increased vaginal d/c at night." She had her husband Aaron Edelman express that they are afraid and don't know if they can make it through this."  Past Medical History:  Diagnosis Date  . Anxiety   . GERD (gastroesophageal reflux disease)   . Gestational thrombocytopenia without hemorrhage ([place]) 10/06/2016  . Hypertension    PIH with last preg  . Hypothyroidism   . IBS (irritable bowel syndrome)   . Kidney stones   . Migraines    as a child  . Pregnancy induced hypertension     Past Surgical History:  Procedure Laterality Date  . BASAL CELL CARCINOMA EXCISION    . DENTAL SURGERY      Family History  Problem Relation Age of Onset  . Cancer Father        thyroid  . Hypertension Father   . Cancer Paternal Grandmother        colon  . Hypertension Paternal Grandmother   . Hypertension Mother   . Hypertension Brother   . Hypertension Maternal Grandmother   . Heart attack Maternal Grandmother   . Hypertension Maternal Grandfather   . Heart attack Maternal Grandfather   . Cancer Paternal Grandfather        brain  . Hypertension Paternal Grandfather   . Diabetes Maternal Uncle   . Hearing loss  Neg Hx     Social History   Tobacco Use  . Smoking status: Never Smoker  . Smokeless tobacco: Never Used  Substance Use Topics  . Alcohol use: Not Currently    Alcohol/week: 3.0 - 4.0 standard drinks    Types: 3 - 4 Standard drinks or equivalent per week    Comment: occ glass of wine- not during pregnancy  . Drug use: No    Allergies: No Known Allergies  Medications Prior to Admission  Medication Sig Dispense Refill Last Dose  . acetaminophen (TYLENOL) 500 MG tablet Take 1,000 mg by mouth every 6 (six) hours as needed for mild pain, moderate pain or headache.      . doxylamine, Sleep, (UNISOM) 25 MG tablet Take 12.5 mg by mouth at bedtime as needed for sleep.     . lansoprazole (PREVACID) 30 MG capsule Take 30 mg by mouth every evening.     Marland Kitchen levothyroxine (SYNTHROID) 75 MCG tablet Take 75 mcg by mouth daily before breakfast.     . Prenatal MV-Min-FA-Omega-3 (PRENATAL GUMMIES/DHA & FA) 0.4-32.5 MG CHEW Chew 2 each by mouth every evening.       Review of Systems  Constitutional: Negative.   HENT: Negative.   Eyes: Negative.   Respiratory: Negative.   Cardiovascular:  Negative.   Gastrointestinal: Negative.   Endocrine: Negative.   Genitourinary: Positive for pelvic pain (increased pressure) and vaginal discharge (increased d/c at bedtime).  Musculoskeletal: Negative.   Skin: Negative.   Allergic/Immunologic: Negative.   Neurological: Negative.   Hematological: Negative.   Psychiatric/Behavioral: Negative.    Physical Exam   Blood pressure 112/84, pulse 99, temperature 99.5 F (37.5 C), temperature source Oral, resp. rate 18, last menstrual period 03/24/2019.  Physical Exam  Nursing note and vitals reviewed. Constitutional: She is oriented to person, place, and time. She appears well-developed and well-nourished.  HENT:  Head: Normocephalic and atraumatic.  Eyes: Pupils are equal, round, and reactive to light.  Cardiovascular: Normal rate.  Respiratory: Effort  normal.  GI: Soft.  Genitourinary:    Genitourinary Comments: SSE: pooling of amniotic fluid seen; loop of umbilical cord pushed through cervical os as speculum neared the cervix; speculum gently removed  no digital cervical exam done and no clear visualization of cervical os   Musculoskeletal: Normal range of motion.  Neurological: She is alert and oriented to person, place, and time.  Skin: Skin is warm and dry.  Psychiatric: She has a normal mood and affect. Her behavior is normal. Judgment and thought content normal.   FHTs by doppler: 175 bpm  MAU Course  Procedures  MDM Sterile speculum exam Preliminary Ultrasound Report       Document Information  Ultrasound    08/20/2019 00:00  Attached To:  Hospital Encounter on 08/20/19  Source Information  Default, Provider, MD   *Consult with Dr. Murrell Redden @ 2201 - notified of patient's complaints and assessments - recommended tx plan admit to L&D for IOL, enter basic admission orders - will see patient once she is on L&D   Assessment and Plan  Prolapse of umbilical cord, single or unspecified fetus - Admit to L&D  - Routine L&D orders - Call Dr. Murrell Redden for IOL orders. - Discussed with patient and spouse the likelihood that baby would survive being born at 59 wks is not good.  - Explained that when baby is born, he will be able to stay with them as long as they want/need him to - Notified that if he is deceased, he can stay with them as long as they want/need him to  - If baby is deceased, there will be funeral information given to them so they can plan a memorial service if they would like - Parents plan cremation, if deceased.  Pelvic pressure in pregnancy, antepartum, second trimester  - [place] MFM OB LIMITED, [place] MFM OB LIMITED  Preterm premature rupture of membranes (PPROM) with unknown onset of labor     Laury Deep, MSN, CNM 08/20/2019, 9:41 PM

## 2019-08-21 ENCOUNTER — Encounter (HOSPITAL_COMMUNITY): Payer: Self-pay | Admitting: Emergency Medicine

## 2019-08-21 ENCOUNTER — Inpatient Hospital Stay (HOSPITAL_COMMUNITY): Payer: Managed Care, Other (non HMO) | Admitting: Anesthesiology

## 2019-08-21 LAB — SARS CORONAVIRUS 2 BY RT PCR (HOSPITAL ORDER, PERFORMED IN ~~LOC~~ HOSPITAL LAB): SARS Coronavirus 2: NEGATIVE

## 2019-08-21 LAB — RPR: RPR Ser Ql: NONREACTIVE

## 2019-08-21 MED ORDER — INFLUENZA VAC SPLIT QUAD 0.5 ML IM SUSY
0.5000 mL | PREFILLED_SYRINGE | INTRAMUSCULAR | Status: AC
  Administered 2019-08-22: 0.5 mL via INTRAMUSCULAR
  Filled 2019-08-21: qty 0.5

## 2019-08-21 MED ORDER — GENTAMICIN SULFATE 40 MG/ML IJ SOLN
5.0000 mg/kg | INTRAVENOUS | Status: DC
  Administered 2019-08-21 – 2019-08-22 (×2): 330 mg via INTRAVENOUS
  Filled 2019-08-21 (×3): qty 8.25

## 2019-08-21 MED ORDER — CLINDAMYCIN PHOSPHATE 900 MG/50ML IV SOLN
900.0000 mg | Freq: Three times a day (TID) | INTRAVENOUS | Status: DC
  Administered 2019-08-21 – 2019-08-22 (×4): 900 mg via INTRAVENOUS
  Filled 2019-08-21 (×4): qty 50

## 2019-08-21 MED ORDER — DIPHENHYDRAMINE HCL 25 MG PO CAPS: 25.0000 mg | ORAL_CAPSULE | Freq: Four times a day (QID) | ORAL | Status: DC | PRN

## 2019-08-21 MED ORDER — BENZOCAINE-MENTHOL 20-0.5 % EX AERO: 1.0000 "application " | INHALATION_SPRAY | CUTANEOUS | Status: DC | PRN

## 2019-08-21 MED ORDER — SIMETHICONE 80 MG PO CHEW: 80.0000 mg | CHEWABLE_TABLET | ORAL | Status: DC | PRN

## 2019-08-21 MED ORDER — DIBUCAINE (PERIANAL) 1 % EX OINT: 1.0000 "application " | TOPICAL_OINTMENT | CUTANEOUS | Status: DC | PRN

## 2019-08-21 MED ORDER — ACETAMINOPHEN 500 MG PO TABS
1000.0000 mg | ORAL_TABLET | Freq: Four times a day (QID) | ORAL | Status: DC | PRN
  Administered 2019-08-21: 12:00:00 1000 mg via ORAL
  Filled 2019-08-21: qty 2

## 2019-08-21 MED ORDER — PRENATAL MULTIVITAMIN CH: 1.0000 | ORAL_TABLET | Freq: Every day | ORAL | Status: DC

## 2019-08-21 MED ORDER — NALBUPHINE HCL 10 MG/ML IJ SOLN
5.0000 mg | INTRAMUSCULAR | Status: DC | PRN
  Administered 2019-08-21: 5 mg via INTRAVENOUS
  Filled 2019-08-21: qty 0.5
  Filled 2019-08-21: qty 1

## 2019-08-21 MED ORDER — MISOPROSTOL 200 MCG PO TABS
800.0000 ug | ORAL_TABLET | Freq: Once | ORAL | Status: AC
  Administered 2019-08-21: 13:00:00 800 ug via RECTAL

## 2019-08-21 MED ORDER — MISOPROSTOL 200 MCG PO TABS
ORAL_TABLET | ORAL | Status: AC
  Administered 2019-08-21: 800 ug via RECTAL
  Filled 2019-08-21: qty 4

## 2019-08-21 MED ORDER — LACTATED RINGERS IV SOLN: 500.0000 mL | Freq: Once | INTRAVENOUS | Status: DC

## 2019-08-21 MED ORDER — LEVOTHYROXINE SODIUM 25 MCG PO TABS
75.0000 ug | ORAL_TABLET | Freq: Every day | ORAL | Status: DC
  Administered 2019-08-22: 75 ug via ORAL
  Filled 2019-08-21: qty 3

## 2019-08-21 MED ORDER — EPHEDRINE 5 MG/ML INJ: 10.0000 mg | INTRAVENOUS | Status: DC | PRN

## 2019-08-21 MED ORDER — LIDOCAINE HCL (PF) 1 % IJ SOLN
INTRAMUSCULAR | Status: DC | PRN
  Administered 2019-08-21 (×2): 4 mL via EPIDURAL

## 2019-08-21 MED ORDER — CLINDAMYCIN PHOSPHATE 900 MG/50ML IV SOLN: 900.0000 mg | Freq: Four times a day (QID) | INTRAVENOUS | Status: DC

## 2019-08-21 MED ORDER — FENTANYL CITRATE (PF) 100 MCG/2ML IJ SOLN
INTRAMUSCULAR | Status: DC | PRN
  Administered 2019-08-21: 15 ug via INTRATHECAL

## 2019-08-21 MED ORDER — ONDANSETRON HCL 4 MG/2ML IJ SOLN: 4.0000 mg | INTRAMUSCULAR | Status: DC | PRN

## 2019-08-21 MED ORDER — LORAZEPAM 2 MG/ML IJ SOLN
1.0000 mg | Freq: Four times a day (QID) | INTRAMUSCULAR | Status: DC | PRN
  Administered 2019-08-21 (×2): 1 mg via INTRAVENOUS
  Filled 2019-08-21 (×2): qty 1

## 2019-08-21 MED ORDER — SODIUM CHLORIDE (PF) 0.9 % IJ SOLN
INTRAMUSCULAR | Status: DC | PRN
  Administered 2019-08-21: 10 mL/h via EPIDURAL

## 2019-08-21 MED ORDER — PANTOPRAZOLE SODIUM 20 MG PO TBEC
20.0000 mg | DELAYED_RELEASE_TABLET | Freq: Every day | ORAL | Status: DC
  Filled 2019-08-21: qty 1

## 2019-08-21 MED ORDER — DIPHENHYDRAMINE HCL 50 MG/ML IJ SOLN: 12.5000 mg | INTRAMUSCULAR | Status: DC | PRN

## 2019-08-21 MED ORDER — MISOPROSTOL 200 MCG PO TABS
400.0000 ug | ORAL_TABLET | ORAL | Status: AC | PRN
  Administered 2019-08-21 (×3): 400 ug via BUCCAL
  Filled 2019-08-21 (×3): qty 2

## 2019-08-21 MED ORDER — ONDANSETRON HCL 4 MG PO TABS: 4.0000 mg | ORAL_TABLET | ORAL | Status: DC | PRN

## 2019-08-21 MED ORDER — PHENYLEPHRINE 40 MCG/ML (10ML) SYRINGE FOR IV PUSH (FOR BLOOD PRESSURE SUPPORT): 80.0000 ug | PREFILLED_SYRINGE | INTRAVENOUS | Status: DC | PRN

## 2019-08-21 MED ORDER — FENTANYL-BUPIVACAINE-NACL 0.5-0.125-0.9 MG/250ML-% EP SOLN
12.0000 mL/h | EPIDURAL | Status: DC | PRN
  Filled 2019-08-21: qty 250

## 2019-08-21 MED ORDER — IBUPROFEN 600 MG PO TABS
600.0000 mg | ORAL_TABLET | Freq: Four times a day (QID) | ORAL | Status: DC
  Administered 2019-08-21 – 2019-08-22 (×4): 600 mg via ORAL
  Filled 2019-08-21 (×4): qty 1

## 2019-08-21 MED ORDER — WITCH HAZEL-GLYCERIN EX PADS: 1.0000 "application " | MEDICATED_PAD | CUTANEOUS | Status: DC | PRN

## 2019-08-21 MED ORDER — SENNOSIDES-DOCUSATE SODIUM 8.6-50 MG PO TABS
2.0000 | ORAL_TABLET | ORAL | Status: DC
  Administered 2019-08-22: 2 via ORAL
  Filled 2019-08-21: qty 2

## 2019-08-21 MED ORDER — ACETAMINOPHEN 325 MG PO TABS: 650.0000 mg | ORAL_TABLET | ORAL | Status: DC | PRN

## 2019-08-21 NOTE — H&P (Signed)
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  The patient is s/p ROM 10/27 and elected for expectant management.  She denies vaginal bleeding, though has noted an increase in discharge over the last day or so.  She denies contraction or pelvic pain, but she has had mild cramping today.  She denies fever/chills/sweats. SSE done in MAU and cord noted protruding from cervix.  Antepartum course: hypothyroid, on synthroid; normal NIPT, normal anatomy u/s but incomplete for heart and face views; episode of vaginal bleeding at15 wga, likely from cervical polyp; h/o pre-eclampsia, baby asa q day [facility] at [place] since 11 wks.  See complete pre-natal records  History         OB History    Gravida  4   Para  2   Term  2   Preterm  0   AB  1   Living  2     SAB  1   TAB  0   Ectopic  0   Multiple  0   Live Births  2              Past Medical History:  Diagnosis Date  . Anxiety   . GERD (gastroesophageal reflux disease)   . Gestational thrombocytopenia without hemorrhage ([place]) 10/06/2016  . Hypertension    PIH with last preg  . Hypothyroidism   . IBS (irritable bowel syndrome)   . Kidney stones   . Migraines    as a child  . Pregnancy induced hypertension         Past Surgical History:  Procedure Laterality Date  . BASAL CELL CARCINOMA EXCISION    . DENTAL SURGERY     Family History: family history includes Cancer in her father, paternal grandfather, and paternal grandmother; Diabetes in her maternal uncle; Heart attack in her maternal grandfather and maternal grandmother; Hypertension in her brother, father, maternal grandfather, maternal grandmother, mother, paternal grandfather, and paternal grandmother. Social History:  reports that she has never smoked. She has never used smokeless tobacco. She reports previous alcohol use of about 3.0 - 4.0  standard drinks of alcohol per week. She reports that she does not use drugs.  ROS: See above otherwise negative  Prenatal labs:  ABO, Rh: --/--/A POS (10/31 2300) Antibody: NEG (10/31 2300) Rubella:  immune RPR:   NR HBsAg:   NR HIV:  neg GBS:   not done Genetic screening: Normal; afp nml Anatomy US: Normal but incomplete for face/heart views  Physical Exam:   Dilation: 1 Effacement (%): Thick Exam by:: Dr. Murrell Redden  Blood pressure 111/71, pulse 95, temperature 98.7 F (37.1 C), temperature source Oral, resp. rate 18,  A&O x 3 HEENT: Normal Lungs: CTAB CV: RRR Abdominal: Soft, Non-tender and Gravid Lower Extremities: Non-edematous, Non-tender  Pelvic Exam:      Dilatation: 1cm     Effacement: 20     Station: High     Presentation: oblique per u/s; cord palpated through cervix and slightly outside the cervix  U/s: oblique, fht 161, CL 0cm and funnelling, AFI 0cm; anterior fundal placenta  Labs:  CBC:  Recent Labs       Lab Results  Component Value Date   WBC 17.5 (H) 08/20/2019   RBC 3.94 08/20/2019   HGB 12.2 08/20/2019   HCT 35.9 (L) 08/20/2019   MCV 91.1 08/20/2019   MCH 31.0 08/20/2019   MCHC 34.0 08/20/2019  RDW 12.3 08/20/2019   PLT 152 08/20/2019     CMP:  Recent Labs       Lab Results  Component Value Date   NA 135 10/04/2016   K 4.0 10/04/2016   CL 108 10/04/2016   CO2 17 (L) 10/04/2016   GLUCOSE 112 (H) 10/04/2016   BUN 12 10/04/2016   CREATININE 0.78 10/04/2016   CALCIUM 8.4 (L) 10/04/2016   PROT 6.9 10/04/2016   AST 15 10/04/2016   ALT 11 (L) 10/04/2016   ALBUMIN 2.7 (L) 10/04/2016   ALKPHOS 154 (H) 10/04/2016   BILITOT 0.2 (L) 10/04/2016   GFRNONAA >60 10/04/2016   GFRAA >60 10/04/2016   ANIONGAP 10 10/04/2016     Urine: Recent Labs       Lab Results  Component Value Date   COLORURINE YELLOW 08/15/2019   APPEARANCEUR CLEAR 08/15/2019   LABSPEC 1.010 08/15/2019   PHURINE 5.5  08/15/2019   GLUCOSEU NEGATIVE 08/15/2019   HGBUR NEGATIVE 08/15/2019   BILIRUBINUR NEGATIVE 08/15/2019   KETONESUR NEGATIVE 08/15/2019   PROTEINUR NEGATIVE 08/15/2019   NITRITE NEGATIVE 08/15/2019   LEUKOCYTESUR MODERATE (A) 08/15/2019     TOCO; no ctx noted  Prenatal Transfer Tool  Maternal Diabetes: No Genetic Screening: Normal Maternal Ultrasounds/Referrals: Normal Fetal Ultrasounds or other Referrals:  None Maternal Substance Abuse:  No Significant Maternal Medications:  None Significant Maternal Lab Results: None    Assessment/Plan:  32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 PPROM,  anhydramnios, - patient now with some cervical dilation and cord prolapsing to cervix; this has been reviewed with the patient and recommendation to proceed for IOL.  The patient understands medical IOL and cytotec reviewed.  Plan for patient comfort with IV medication or epidural if needed.  Patient requesting anxiety medication.  She and husband understand that while +FHT now, that baby is not viable and will not be able to survive after delivery, with this in mind we will not monitor FHT.  At this point they are trying to process current situation and will address creamation, burial, etc at later time, though in MAU said that they would like creamation.  Decline chaplin at this time, though aware of services available.  Plan cytotec bucally.  Understands risk for retained placenta.  The patient and husband are grieving appropriately but also somewhat still processing what is transpiring.  Elevated wbc but afebrile and no abdominal tenderness, will monitor closely.   2. Hypothyroid 3. Acute anxiety - plan ativan 4. Non-viable fetus: fht 160s on u/s   [name] 08/21/2019, 12:30 AM

## 2019-08-21 NOTE — Anesthesia Preprocedure Evaluation (Addendum)
Anesthesia Evaluation  Patient identified by MRN, date of birth, ID band Patient awake    Reviewed: Allergy & Precautions, Patient's Chart, lab work & pertinent test results  History of Anesthesia Complications Negative for: history of anesthetic complications  Airway Mallampati: II  TM Distance: >3 FB Neck ROM: Full    Dental no notable dental hx.    Pulmonary neg pulmonary ROS,    Pulmonary exam normal        Cardiovascular negative cardio ROS Normal cardiovascular exam     Neuro/Psych  Headaches, Anxiety    GI/Hepatic Neg liver ROS, GERD  Controlled and Medicated,  Endo/Other  Hypothyroidism   Renal/GU negative Renal ROS  negative genitourinary   Musculoskeletal negative musculoskeletal ROS (+)   Abdominal   Peds  Hematology negative hematology ROS (+)   Anesthesia Other Findings Day of surgery medications reviewed with patient.  Reproductive/Obstetrics (+) Pregnancy ([redacted]w[redacted]d, PROM, cord prolapse)                           Anesthesia Physical Anesthesia Plan  ASA: II  Anesthesia Plan: Combined Spinal and Epidural   Post-op Pain Management:    Induction:   PONV Risk Score and Plan: Treatment may vary due to age or medical condition  Airway Management Planned: Natural Airway  Additional Equipment:   Intra-op Plan:   Post-operative Plan:   Informed Consent: I have reviewed the patients History and Physical, chart, labs and discussed the procedure including the risks, benefits and alternatives for the proposed anesthesia with the patient or authorized representative who has indicated his/her understanding and acceptance.       Plan Discussed with:   Anesthesia Plan Comments: (Hx of sacral sparing with previous epidural. Discussed CSE and patient gave consent for procedure. )       Anesthesia Quick Evaluation

## 2019-08-21 NOTE — Anesthesia Procedure Notes (Signed)
Epidural Patient location during procedure: OB Start time: 08/21/2019 5:04 AM End time: 08/21/2019 5:07 AM  Staffing Anesthesiologist: Brennan Bailey, MD Performed: anesthesiologist   Preanesthetic Checklist Completed: patient identified, pre-op evaluation, timeout performed, IV checked, risks and benefits discussed and monitors and equipment checked  Epidural Patient position: sitting Prep: site prepped and draped and DuraPrep Patient monitoring: heart rate, continuous pulse ox and blood pressure Approach: midline Location: L3-L4 Injection technique: LOR air  Needle:  Needle type: Tuohy  Needle gauge: 17 G Needle length: 9 cm Needle insertion depth: 6 cm Catheter type: closed end flexible Catheter size: 19 Gauge Catheter at skin depth: 11 cm  Assessment Events: blood not aspirated, injection not painful, no injection resistance, negative IV test and no paresthesia  Additional Notes Patient identified. Risks, benefits, and alternatives discussed with patient including but not limited to bleeding, infection, nerve damage, paralysis, failed block, incomplete pain control, headache, blood pressure changes, nausea, vomiting, reactions to medication, itching, and postpartum back pain. Confirmed with bedside nurse the patient's most recent platelet count. Confirmed with patient that they are not currently taking any anticoagulation, have any bleeding history, or any family history of bleeding disorders. Patient expressed understanding and wished to proceed. All questions were answered. Sterile technique was used throughout the entire procedure. Please see nursing notes for vital signs.   Crisp LOR with Tuohy needle on first pass. Whitacre 25g 180mm spinal needle introduced through Tuohy with clear CSF return prior to injection of intrathecal medication. Spinal needle withdrawn and epidural catheter threaded easily. Negative aspiration of catheter for heme or CSF prior to starting  epidural infusion. Warning signs of high block given to the patient including shortness of breath, tingling/numbness in hands, complete motor block, or any concerning symptoms with instructions to call for help. Patient was given instructions on fall risk and not to get out of bed. All questions and concerns addressed with instructions to call with any issues or inadequate analgesia.  Patient comfortable with contractions prior to my leaving room. Reason for block:procedure for pain

## 2019-08-21 NOTE — Anesthesia Postprocedure Evaluation (Signed)
Anesthesia Post Note  Patient: Katherine Marshall  Procedure(s) Performed: AN AD [place]     Patient location during evaluation: Mother Baby Anesthesia Type: Epidural Level of consciousness: awake Pain management: satisfactory to patient Vital Signs Assessment: post-procedure vital signs reviewed and stable Respiratory status: spontaneous breathing Cardiovascular status: stable Anesthetic complications: no    Last Vitals:  Vitals:   08/21/19 1345 08/21/19 1500  BP: 99/61 96/60  Pulse: 87 88  Resp: 18   Temp:  37.1 C  SpO2:  100%    Last Pain:  Vitals:   08/21/19 1103  TempSrc:   PainSc: 0-No pain   Pain Goal:     Block resolving. Report from Dana

## 2019-08-21 NOTE — Progress Notes (Signed)
ANTIBIOTIC CONSULT NOTE - INITIAL  Pharmacy Consult for Gentamicin Indication: Chorioamnionitis    Patient Measurements: Adjusted Body Weight: 65.5 kg   Medications:  Clindamycin 900mg  IV q 8 hr   Goal of Therapy:  Gentamicin peak 20-25 mg/L and Trough < 1 mg/L  Plan:  Gentamicin 330 mg (5mg /kg) IV every 24 hrs  Check Scr with next labs if gentamicin continued. Will check gentamicin levels if continued > 72hr or clinically indicated.  Emilio Math, RPh

## 2019-08-21 NOTE — Progress Notes (Signed)
Comfortable with epidural, can feel slight pressue with ctx and noting more frequently Plans for creamation, FIL just passed and they will sprinkle ashes in special family spot Wants ativan for delivery, asking for anxiety medication with d/c   Temp:  [98.4 F (36.9 C)-99.5 F (37.5 C)] 99 F (37.2 C) (11/01 0731) Pulse Rate:  [83-107] 95 (11/01 0731) Resp:  [16-20] 16 (11/01 0731) BP: (101-133)/(50-84) 113/67 (11/01 0731) Weight:  [92.1 kg] 92.1 kg (11/01 0545)  A&ox3 nml respirations Abd: soft, nt, gravid Cx: deferred (just examined by nurse, see note) LE: no edema, nt bilat  TOCO: q 2-4 min  A/P: iup at 21.3 wga 1. PPROM - contin IOL, now with 3rd cytotec dose; has epidural, plan svd; coping well 2. Anxiety - ativan prn, last night had dose and was able to sleep; understands can have prn and will plan for dose with delivery

## 2019-08-22 LAB — CBC
HCT: 34.4 % — ABNORMAL LOW (ref 36.0–46.0)
Hemoglobin: 11.3 g/dL — ABNORMAL LOW (ref 12.0–15.0)
MCH: 30.4 pg (ref 26.0–34.0)
MCHC: 32.8 g/dL (ref 30.0–36.0)
MCV: 92.5 fL (ref 80.0–100.0)
Platelets: 153 10*3/uL (ref 150–400)
RBC: 3.72 MIL/uL — ABNORMAL LOW (ref 3.87–5.11)
RDW: 12.4 % (ref 11.5–15.5)
WBC: 14.8 10*3/uL — ABNORMAL HIGH (ref 4.0–10.5)
nRBC: 0 % (ref 0.0–0.2)

## 2019-08-22 MED ORDER — LORAZEPAM 1 MG PO TABS: 1.0000 mg | ORAL_TABLET | Freq: Three times a day (TID) | ORAL | 0 refills | Status: DC | PRN

## 2019-08-22 NOTE — Lactation Note (Signed)
Lactation Consultation Note  Patient Name: Katherine Marshall [id number] Date: 08/22/2019   I met w/Mom and provided her w/an excerpt of the Lactation After Loss brochure. We discussed not binding her breasts or limiting fluid intake or preventing herself from taking a warm shower. We discussed comfort measures such as cabbage leaves & how to prepare them and putting drops of peppermint oil in a carrier oil (Mom has peppermint oil at home) & massaging on breast. Mom also knows she can take Tylenol & IB (as Rx by her provider).   Mom understands that we want to keep her breasts comfortable and don't want Mom to become engorged or develop mastitis. Hand pump was provided so that Mom can express if she begins to become uncomfortably full. Mom understands that the pump is not to be used to empty her breasts, but to express some milk for her comfort.  Mom was shown how to use the hand pump. On visual inspection, a size 24 flange appears appropriate for her, but I also provided a size 27 flange in case.   Mom was thankful for information. S/sx of mastitis discussed & Mom knows to call her OB office if those were to occur. Of note, Mom does think she is getting discharged home on antibx.   Mom knows how to get in touch w/the lactation office if she has further questions once she gets home.     Matthias Hughs [facility] 08/22/2019, 9:16 AM

## 2019-08-22 NOTE — Progress Notes (Signed)
PPD 1 SVD:   S:  Pt reports feeling ok/ Tolerating po/ Voiding without problems   O:  A & O x 3 / VS: Blood pressure (!) 100/44, pulse 91, temperature 98.3 F (36.8 C), temperature source Oral, resp. rate 18, height 5\' 1"  (1.549 m), weight 92.1 kg, last menstrual period 03/24/2019, SpO2 100 %, unknown if currently breastfeeding.  LABS:  Results for orders placed or performed during the hospital encounter of 08/20/19 (from the past 24 hour(s))  CBC     Status: Abnormal   Collection Time: 08/22/19  5:01 AM  Result Value Ref Range   WBC 14.8 (H) 4.0 - 10.5 K/uL   RBC 3.72 (L) 3.87 - 5.11 MIL/uL   Hemoglobin 11.3 (L) 12.0 - 15.0 g/dL   HCT 34.4 (L) 36.0 - 46.0 %   MCV 92.5 80.0 - 100.0 fL   MCH 30.4 26.0 - 34.0 pg   MCHC 32.8 30.0 - 36.0 g/dL   RDW 12.4 11.5 - 15.5 %   Platelets 153 150 - 400 K/uL   nRBC 0.0 0.0 - 0.2 %    I&O: I/O last 3 completed shifts: In: 360 [P.O.:360] Out: 1500 [Urine:1400; Blood:100]   No intake/output data recorded.  Lungs: clear to A                Cor RRR              Abdomen:  Uterus firm non tender Pad scant blood Extr: no edema    A/P: PPD # 1/ [id number] s/p fetal loss.   Doing well  D/c home f/u 2 wk

## 2019-08-22 NOTE — Discharge Summary (Signed)
Obstetric Discharge Summary Reason for Admission: cord prolapse. PPROM, anhydramnios IUP @ 21 3/7 wk Prenatal Procedures: ultrasound Intrapartum Procedures: spontaneous vaginal delivery Postpartum Procedures: none Complications-Operative and Postpartum: none Hemoglobin  Date Value Ref Range Status  08/22/2019 11.3 (L) 12.0 - 15.0 g/dL Final   Hemoglobin, fingerstick  Date Value Ref Range Status  06/22/2013 14.0 12.0 - 16.0 g/dL Final   HCT  Date Value Ref Range Status  08/22/2019 34.4 (L) 36.0 - 46.0 % Final    Physical Exam:  General: alert, cooperative and no distress Lochia: appropriate Uterine Fundus: firm Incision: n/a DVT Evaluation: No evidence of DVT seen on physical exam.  Discharge Diagnoses: preterm delivered. stillbirth. PPROM@ 20 1/2 wk, cord prolapse  Discharge Information: Date: 08/22/2019 Activity: pelvic rest Diet: routine Medications: PNV and Ibuprofen Condition: stable Instructions: call for temp >100.4, severe abdominal pain, passage of large clots Discharge to: home   Newborn Data: Nonviable still born female  Birth Weight: 10.7 oz (303 g) APGAR:0 , 0  Newborn Delivery   Birth date/time: 08/21/2019 12:28:00 Delivery type: Vaginal, Spontaneous      baby to  morgue.  Katherine Marshall A Katherine Marshall 08/22/2019, 2:15 PM

## 2019-08-23 LAB — SURGICAL PATHOLOGY

## 2019-08-25 ENCOUNTER — Encounter (HOSPITAL_COMMUNITY): Payer: Self-pay | Admitting: *Deleted

## 2019-11-19 ENCOUNTER — Ambulatory Visit: Payer: Managed Care, Other (non HMO)

## 2019-11-28 ENCOUNTER — Ambulatory Visit: Payer: Managed Care, Other (non HMO) | Attending: Family Medicine

## 2019-11-28 DIAGNOSIS — Z23 Encounter for immunization: Secondary | ICD-10-CM

## 2019-11-28 NOTE — Progress Notes (Signed)
   Covid-19 Vaccination Clinic  Name:  Katherine Marshall    MRN: [id number] DOB: 02/25/1986  11/28/2019  Katherine Marshall was observed post Covid-19 immunization for 15 minutes without incidence. She was provided with Vaccine Information Sheet and instruction to access the V-Safe system.   Katherine Marshall was instructed to call 911 with any severe reactions post vaccine: Marland Kitchen Difficulty breathing  . Swelling of your face and throat  . A fast heartbeat  . A bad rash all over your body  . Dizziness and weakness    Immunizations Administered    Name Date Dose VIS Date Route   Pfizer COVID-19 Vaccine 11/28/2019  6:04 PM 0.3 mL 09/30/2019 Intramuscular   Manufacturer: Emma   Lot: [id number]   Katherine Marshall: [id number]

## 2019-12-16 ENCOUNTER — Ambulatory Visit: Payer: Managed Care, Other (non HMO)

## 2019-12-26 ENCOUNTER — Ambulatory Visit: Payer: Managed Care, Other (non HMO) | Attending: Internal Medicine

## 2019-12-26 DIAGNOSIS — Z23 Encounter for immunization: Secondary | ICD-10-CM | POA: Insufficient documentation

## 2019-12-26 NOTE — Progress Notes (Signed)
   Covid-19 Vaccination Clinic  Name:  Katherine Marshall    MRN: [id number] DOB: [birth year]  12/26/2019  [name] was observed post Covid-19 immunization for 15 minutes without incident. She was provided with Vaccine Information Sheet and instruction to access the V-Safe system.   [name] was instructed to call 911 with any severe reactions post vaccine: Marland Kitchen Difficulty breathing  . Swelling of face and throat  . A fast heartbeat  . A bad rash all over body  . Dizziness and weakness   Immunizations Administered    Name Date Dose VIS Date Route   Pfizer COVID-19 Vaccine 12/26/2019  9:00 AM 0.3 mL 09/30/2019 Intramuscular   Manufacturer: Orin   Lot: [id number]   [place]: [id number]

## 2020-10-20 NOTE — L&D Delivery Note (Signed)
Delivery Note At 12:59 AM a viable and healthy female was delivered via Vaginal, Spontaneous (Presentation: Middle Occiput Anterior).  APGAR: 9, 9; weight pending .   Placenta status: Spontaneous, Intact.  Cord: 3 vessels with the following complications: None.  Cord pH: NA  Anesthesia: Epidural Episiotomy: None Lacerations: 1st degree Suture Repair: 2.0 vicryl rapide Est. Blood Loss (mL): 200  Mom to postpartum.  Baby to Couplet care / Skin to Skin.  Katherine Marshall 05/14/2021, 1:13 AM

## 2021-03-22 ENCOUNTER — Other Ambulatory Visit: Payer: Self-pay | Admitting: Obstetrics & Gynecology

## 2021-03-22 DIAGNOSIS — O365931 Maternal care for other known or suspected poor fetal growth, third trimester, fetus 1: Secondary | ICD-10-CM

## 2021-03-27 ENCOUNTER — Encounter: Payer: Self-pay | Admitting: *Deleted

## 2021-03-29 ENCOUNTER — Ambulatory Visit (HOSPITAL_BASED_OUTPATIENT_CLINIC_OR_DEPARTMENT_OTHER): Payer: Managed Care, Other (non HMO) | Admitting: Obstetrics and Gynecology

## 2021-03-29 ENCOUNTER — Ambulatory Visit: Payer: Managed Care, Other (non HMO) | Admitting: *Deleted

## 2021-03-29 ENCOUNTER — Encounter: Payer: Self-pay | Admitting: *Deleted

## 2021-03-29 ENCOUNTER — Ambulatory Visit (HOSPITAL_BASED_OUTPATIENT_CLINIC_OR_DEPARTMENT_OTHER): Payer: Managed Care, Other (non HMO) | Admitting: *Deleted

## 2021-03-29 ENCOUNTER — Other Ambulatory Visit: Payer: Self-pay

## 2021-03-29 ENCOUNTER — Other Ambulatory Visit: Payer: Self-pay | Admitting: Obstetrics & Gynecology

## 2021-03-29 ENCOUNTER — Ambulatory Visit: Payer: Managed Care, Other (non HMO) | Attending: Obstetrics & Gynecology

## 2021-03-29 VITALS — BP 126/85 | HR 87

## 2021-03-29 DIAGNOSIS — Z363 Encounter for antenatal screening for malformations: Secondary | ICD-10-CM | POA: Diagnosis not present

## 2021-03-29 DIAGNOSIS — E669 Obesity, unspecified: Secondary | ICD-10-CM

## 2021-03-29 DIAGNOSIS — O365930 Maternal care for other known or suspected poor fetal growth, third trimester, not applicable or unspecified: Secondary | ICD-10-CM

## 2021-03-29 DIAGNOSIS — O99213 Obesity complicating pregnancy, third trimester: Secondary | ICD-10-CM

## 2021-03-29 DIAGNOSIS — O09523 Supervision of elderly multigravida, third trimester: Secondary | ICD-10-CM

## 2021-03-29 DIAGNOSIS — Z3A29 29 weeks gestation of pregnancy: Secondary | ICD-10-CM | POA: Diagnosis present

## 2021-03-29 DIAGNOSIS — Z8759 Personal history of other complications of pregnancy, childbirth and the puerperium: Secondary | ICD-10-CM

## 2021-03-29 DIAGNOSIS — O365931 Maternal care for other known or suspected poor fetal growth, third trimester, fetus 1: Secondary | ICD-10-CM | POA: Diagnosis present

## 2021-03-29 DIAGNOSIS — O99343 Other mental disorders complicating pregnancy, third trimester: Secondary | ICD-10-CM

## 2021-03-29 DIAGNOSIS — E079 Disorder of thyroid, unspecified: Secondary | ICD-10-CM

## 2021-03-29 DIAGNOSIS — O321XX0 Maternal care for breech presentation, not applicable or unspecified: Secondary | ICD-10-CM

## 2021-03-29 DIAGNOSIS — O09293 Supervision of pregnancy with other poor reproductive or obstetric history, third trimester: Secondary | ICD-10-CM

## 2021-03-29 DIAGNOSIS — O99283 Endocrine, nutritional and metabolic diseases complicating pregnancy, third trimester: Secondary | ICD-10-CM

## 2021-03-29 DIAGNOSIS — O09213 Supervision of pregnancy with history of pre-term labor, third trimester: Secondary | ICD-10-CM | POA: Diagnosis not present

## 2021-03-29 DIAGNOSIS — O350XX0 Maternal care for (suspected) central nervous system malformation in fetus, not applicable or unspecified: Secondary | ICD-10-CM | POA: Diagnosis not present

## 2021-03-29 DIAGNOSIS — F419 Anxiety disorder, unspecified: Secondary | ICD-10-CM

## 2021-03-29 DIAGNOSIS — O2623 Pregnancy care for patient with recurrent pregnancy loss, third trimester: Secondary | ICD-10-CM

## 2021-03-29 NOTE — Procedures (Signed)
Katherine Marshall 08/09/1986 [redacted]w[redacted]d  Fetus A Non-Stress Test Interpretation for 03/29/21  Indication: IUGR  Fetal Heart Rate A Mode: External Baseline Rate (A): 140 bpm Variability: Moderate Accelerations: 10 x 10 Decelerations: None Multiple birth?: No  Uterine Activity Mode: Palpation, Toco Contraction Frequency (min): none Resting Tone Palpated: Relaxed Resting Time: Adequate  Interpretation (Fetal Testing) Nonstress Test Interpretation: Reactive Overall Impression: Reassuring for gestational age Comments: Dr. Donalee Citrin reviewed tracing.

## 2021-03-29 NOTE — Progress Notes (Signed)
Maternal-Fetal Medicine  Name: Katherine Marshall DOB: 01-29-1986 MRN: [id number] Referring Provide: Azucena Fallen, MD   Katherine Marshall, is here for a second opinion. At your office ultrasound, severe fetal growth restriction was suspected.  On cell free fetal DNA screening, the risks of fetal aneuploidies are not increased.  MSAFP screening showed low risk for open neural tube defects.  Patient was evaluated by fertility specialist before conception.  This is natural conception and her pregnancy is well dated by early ultrasound.  Obstetrical history is significant for 2 term vaginal deliveries.  Both her sons are in good health.  Her first pregnancy was complicated by gestational hypertension.  In November 2020, she had PPROM at 21-weeks' gestation. On arrival to the hospital, cord proloapse was noted. Patient underwent induction of labor and delivered a stillborn female infant. PSH: No history of hypertension or diabetes. She has hypothyroidism and takes Synthroid 137 mcg daily. Past surgical history: Mohs surgery (basal cell carcinoma). Medications: Prenatal vitamins, low-dose aspirin. Allergies: No known drug allergies.  Ultrasound On ultrasound, the estimated fetal weight is at the 1st percentile.  Abdominal circumference measurement is at the 5th percentile.  Head circumference measurement is at between -1 and -2 SD (normal).  Amniotic fluid is normal good fetal activity seen.  Umbilical artery Doppler showed normal forward diastolic flow.  Of note right lateral ventricle measures 11 mm.  Intracranial structures appear normal. Rest of fetal anatomy appears normal. No evidence of intracranial or abdominal calcifications. NST is reactive for this gestational age. Our concerns include: Fetal growth restriction -Possible causes of fetal growth restriction include fetal chromosomal anomalies or genetic syndromes. Other causes include placental insufficiency and fetal infections (unlikely).  Placental insufficiency is the most-likely cause. In women who develop preeclampsia later in pregnancy, growth restriction may manifest earlier. Her first pregnancy was complicated by gestational hypertension. -Amniocentesis will be able to confirm chromosomal anomalies or some (not all) genetic syndromes. I discussed the procedure and its possible complications including preterm delivery (1 in 500 procedures) -Antenatal corticosteroids help in fetal lung maturity especially if administered about a week before delivery. I recommend antenatal corticosteroids if worsening Doppler is seen (absent or reversed-end-diastolic flow). -I briefly discussed timing of delivery that is based on interval fetal growth assessment and abnormal antenatal testing.  Ventriculomegaly I counseled the couple that ventriculomegaly can be associated with fetal chromosomal malformations, genetic syndromes. Fetal Down syndrome is unlikely given that she had low risk for Down syndrome on cell-free DNA screening. I reassured the couple that in most cases, isolated mild ventriculomegaly is not associated with adverse neurodevelopmental outcomes. It may resolve with advancing gestation or after birth. After counseling, the patient opted not to have amniocentesis. She will call for an appointment if she wishes to proceed with amniocentesis. Recommendations -Patient has follow-up BPP and UA Doppler studies at your office. -Fetal growth, BPP, UA Doppler studies and NST at our office in 3 weeks.  Thank you for consultation. If you have any questions, please contact me at the [facility]. Consultation including face-to-face counseling 45 minutes.

## 2021-04-01 ENCOUNTER — Other Ambulatory Visit: Payer: Self-pay | Admitting: *Deleted

## 2021-04-01 DIAGNOSIS — O365930 Maternal care for other known or suspected poor fetal growth, third trimester, not applicable or unspecified: Secondary | ICD-10-CM

## 2021-04-19 ENCOUNTER — Encounter: Payer: Self-pay | Admitting: *Deleted

## 2021-04-19 ENCOUNTER — Ambulatory Visit: Payer: Managed Care, Other (non HMO) | Admitting: *Deleted

## 2021-04-19 ENCOUNTER — Other Ambulatory Visit: Payer: Self-pay

## 2021-04-19 ENCOUNTER — Other Ambulatory Visit: Payer: Self-pay | Admitting: Obstetrics and Gynecology

## 2021-04-19 ENCOUNTER — Ambulatory Visit: Payer: Managed Care, Other (non HMO) | Attending: Obstetrics and Gynecology

## 2021-04-19 VITALS — BP 126/72 | HR 102

## 2021-04-19 DIAGNOSIS — E079 Disorder of thyroid, unspecified: Secondary | ICD-10-CM

## 2021-04-19 DIAGNOSIS — O09523 Supervision of elderly multigravida, third trimester: Secondary | ICD-10-CM

## 2021-04-19 DIAGNOSIS — O99343 Other mental disorders complicating pregnancy, third trimester: Secondary | ICD-10-CM

## 2021-04-19 DIAGNOSIS — Z8759 Personal history of other complications of pregnancy, childbirth and the puerperium: Secondary | ICD-10-CM

## 2021-04-19 DIAGNOSIS — O99213 Obesity complicating pregnancy, third trimester: Secondary | ICD-10-CM

## 2021-04-19 DIAGNOSIS — O365990 Maternal care for other known or suspected poor fetal growth, unspecified trimester, not applicable or unspecified: Secondary | ICD-10-CM

## 2021-04-19 DIAGNOSIS — F419 Anxiety disorder, unspecified: Secondary | ICD-10-CM

## 2021-04-19 DIAGNOSIS — Z363 Encounter for antenatal screening for malformations: Secondary | ICD-10-CM | POA: Diagnosis not present

## 2021-04-19 DIAGNOSIS — O2623 Pregnancy care for patient with recurrent pregnancy loss, third trimester: Secondary | ICD-10-CM

## 2021-04-19 DIAGNOSIS — E669 Obesity, unspecified: Secondary | ICD-10-CM

## 2021-04-19 DIAGNOSIS — Z3A32 32 weeks gestation of pregnancy: Secondary | ICD-10-CM

## 2021-04-19 DIAGNOSIS — O350XX0 Maternal care for (suspected) central nervous system malformation in fetus, not applicable or unspecified: Secondary | ICD-10-CM

## 2021-04-19 DIAGNOSIS — O365930 Maternal care for other known or suspected poor fetal growth, third trimester, not applicable or unspecified: Secondary | ICD-10-CM

## 2021-04-19 DIAGNOSIS — O09293 Supervision of pregnancy with other poor reproductive or obstetric history, third trimester: Secondary | ICD-10-CM

## 2021-04-19 DIAGNOSIS — O99283 Endocrine, nutritional and metabolic diseases complicating pregnancy, third trimester: Secondary | ICD-10-CM

## 2021-04-29 ENCOUNTER — Telehealth: Payer: Self-pay

## 2021-05-07 LAB — OB RESULTS CONSOLE GC/CHLAMYDIA
Chlamydia: NEGATIVE
Gonorrhea: NEGATIVE

## 2021-05-07 LAB — OB RESULTS CONSOLE HEPATITIS B SURFACE ANTIGEN: Hepatitis B Surface Ag: NEGATIVE

## 2021-05-07 LAB — OB RESULTS CONSOLE RUBELLA ANTIBODY, IGM: Rubella: IMMUNE

## 2021-05-07 LAB — OB RESULTS CONSOLE HIV ANTIBODY (ROUTINE TESTING): HIV: NONREACTIVE

## 2021-05-10 ENCOUNTER — Encounter (HOSPITAL_COMMUNITY): Payer: Self-pay | Admitting: Obstetrics & Gynecology

## 2021-05-10 ENCOUNTER — Ambulatory Visit: Payer: Managed Care, Other (non HMO) | Admitting: *Deleted

## 2021-05-10 ENCOUNTER — Encounter: Payer: Self-pay | Admitting: *Deleted

## 2021-05-10 ENCOUNTER — Other Ambulatory Visit: Payer: Self-pay

## 2021-05-10 ENCOUNTER — Inpatient Hospital Stay (HOSPITAL_COMMUNITY)
Admission: AD | Admit: 2021-05-10 | Discharge: 2021-05-15 | DRG: 807 | Disposition: A | Discharge status: Home / Self Care | Payer: Managed Care, Other (non HMO) | Attending: Obstetrics & Gynecology | Admitting: Obstetrics & Gynecology

## 2021-05-10 ENCOUNTER — Ambulatory Visit (HOSPITAL_BASED_OUTPATIENT_CLINIC_OR_DEPARTMENT_OTHER): Payer: Managed Care, Other (non HMO)

## 2021-05-10 VITALS — BP 159/104 | HR 81

## 2021-05-10 DIAGNOSIS — O99213 Obesity complicating pregnancy, third trimester: Secondary | ICD-10-CM

## 2021-05-10 DIAGNOSIS — F419 Anxiety disorder, unspecified: Secondary | ICD-10-CM | POA: Diagnosis present

## 2021-05-10 DIAGNOSIS — O358XX0 Maternal care for other (suspected) fetal abnormality and damage, not applicable or unspecified: Secondary | ICD-10-CM | POA: Diagnosis present

## 2021-05-10 DIAGNOSIS — O99214 Obesity complicating childbirth: Secondary | ICD-10-CM | POA: Diagnosis present

## 2021-05-10 DIAGNOSIS — Z20822 Contact with and (suspected) exposure to covid-19: Secondary | ICD-10-CM | POA: Diagnosis present

## 2021-05-10 DIAGNOSIS — E039 Hypothyroidism, unspecified: Secondary | ICD-10-CM | POA: Diagnosis present

## 2021-05-10 DIAGNOSIS — O2623 Pregnancy care for patient with recurrent pregnancy loss, third trimester: Secondary | ICD-10-CM

## 2021-05-10 DIAGNOSIS — O365930 Maternal care for other known or suspected poor fetal growth, third trimester, not applicable or unspecified: Secondary | ICD-10-CM

## 2021-05-10 DIAGNOSIS — O368390 Maternal care for abnormalities of the fetal heart rate or rhythm, unspecified trimester, not applicable or unspecified: Secondary | ICD-10-CM

## 2021-05-10 DIAGNOSIS — O09293 Supervision of pregnancy with other poor reproductive or obstetric history, third trimester: Secondary | ICD-10-CM

## 2021-05-10 DIAGNOSIS — O09523 Supervision of elderly multigravida, third trimester: Secondary | ICD-10-CM

## 2021-05-10 DIAGNOSIS — O99284 Endocrine, nutritional and metabolic diseases complicating childbirth: Secondary | ICD-10-CM | POA: Diagnosis present

## 2021-05-10 DIAGNOSIS — O365990 Maternal care for other known or suspected poor fetal growth, unspecified trimester, not applicable or unspecified: Secondary | ICD-10-CM

## 2021-05-10 DIAGNOSIS — O99343 Other mental disorders complicating pregnancy, third trimester: Secondary | ICD-10-CM

## 2021-05-10 DIAGNOSIS — O1413 Severe pre-eclampsia, third trimester: Secondary | ICD-10-CM | POA: Diagnosis not present

## 2021-05-10 DIAGNOSIS — O1414 Severe pre-eclampsia complicating childbirth: Principal | ICD-10-CM | POA: Diagnosis present

## 2021-05-10 DIAGNOSIS — O350XX0 Maternal care for (suspected) central nervous system malformation in fetus, not applicable or unspecified: Secondary | ICD-10-CM | POA: Diagnosis not present

## 2021-05-10 DIAGNOSIS — O368330 Maternal care for abnormalities of the fetal heart rate or rhythm, third trimester, not applicable or unspecified: Secondary | ICD-10-CM | POA: Diagnosis not present

## 2021-05-10 DIAGNOSIS — Z3A35 35 weeks gestation of pregnancy: Secondary | ICD-10-CM

## 2021-05-10 DIAGNOSIS — Z8759 Personal history of other complications of pregnancy, childbirth and the puerperium: Secondary | ICD-10-CM

## 2021-05-10 DIAGNOSIS — E669 Obesity, unspecified: Secondary | ICD-10-CM

## 2021-05-10 DIAGNOSIS — Z362 Encounter for other antenatal screening follow-up: Secondary | ICD-10-CM

## 2021-05-10 DIAGNOSIS — Z3689 Encounter for other specified antenatal screening: Secondary | ICD-10-CM

## 2021-05-10 DIAGNOSIS — E079 Disorder of thyroid, unspecified: Secondary | ICD-10-CM

## 2021-05-10 DIAGNOSIS — O99283 Endocrine, nutritional and metabolic diseases complicating pregnancy, third trimester: Secondary | ICD-10-CM

## 2021-05-10 LAB — URINALYSIS, ROUTINE W REFLEX MICROSCOPIC
Bilirubin Urine: NEGATIVE
Glucose, UA: NEGATIVE mg/dL
Hgb urine dipstick: NEGATIVE
Ketones, ur: NEGATIVE mg/dL
Nitrite: NEGATIVE
Protein, ur: NEGATIVE mg/dL
Specific Gravity, Urine: 1.011 (ref 1.005–1.030)
pH: 7 (ref 5.0–8.0)

## 2021-05-10 LAB — CBC WITH DIFFERENTIAL/PLATELET
Abs Immature Granulocytes: 0.05 10*3/uL (ref 0.00–0.07)
Basophils Absolute: 0 10*3/uL (ref 0.0–0.1)
Basophils Relative: 0 %
Eosinophils Absolute: 0.1 10*3/uL (ref 0.0–0.5)
Eosinophils Relative: 1 %
HCT: 34.6 % — ABNORMAL LOW (ref 36.0–46.0)
Hemoglobin: 11.2 g/dL — ABNORMAL LOW (ref 12.0–15.0)
Immature Granulocytes: 1 %
Lymphocytes Relative: 24 %
Lymphs Abs: 2.4 10*3/uL (ref 0.7–4.0)
MCH: 28.6 pg (ref 26.0–34.0)
MCHC: 32.4 g/dL (ref 30.0–36.0)
MCV: 88.5 fL (ref 80.0–100.0)
Monocytes Absolute: 0.6 10*3/uL (ref 0.1–1.0)
Monocytes Relative: 6 %
Neutro Abs: 6.9 10*3/uL (ref 1.7–7.7)
Neutrophils Relative %: 68 %
Platelets: 161 10*3/uL (ref 150–400)
RBC: 3.91 MIL/uL (ref 3.87–5.11)
RDW: 12.7 % (ref 11.5–15.5)
WBC: 10.1 10*3/uL (ref 4.0–10.5)
nRBC: 0 % (ref 0.0–0.2)

## 2021-05-10 LAB — COMPREHENSIVE METABOLIC PANEL
ALT: 22 U/L (ref 0–44)
AST: 23 U/L (ref 15–41)
Albumin: 2.5 g/dL — ABNORMAL LOW (ref 3.5–5.0)
Alkaline Phosphatase: 91 U/L (ref 38–126)
Anion gap: 8 (ref 5–15)
BUN: 10 mg/dL (ref 6–20)
CO2: 21 mmol/L — ABNORMAL LOW (ref 22–32)
Calcium: 8.8 mg/dL — ABNORMAL LOW (ref 8.9–10.3)
Chloride: 104 mmol/L (ref 98–111)
Creatinine, Ser: 0.85 mg/dL (ref 0.44–1.00)
GFR, Estimated: >60 mL/min (ref >60)
Glucose, Bld: 91 mg/dL (ref 70–99)
Potassium: 4.1 mmol/L (ref 3.5–5.1)
Sodium: 133 mmol/L — ABNORMAL LOW (ref 135–145)
Total Bilirubin: 0.3 mg/dL (ref 0.3–1.2)
Total Protein: 6.1 g/dL — ABNORMAL LOW (ref 6.5–8.1)

## 2021-05-10 LAB — PROTEIN / CREATININE RATIO, URINE
Creatinine, Urine: 52.81 mg/dL
Protein Creatinine Ratio: 0.3 mg/mg{Cre} — ABNORMAL HIGH (ref 0.00–0.15)
Total Protein, Urine: 16 mg/dL

## 2021-05-10 LAB — TYPE AND SCREEN
ABO/RH(D): A POS
Antibody Screen: NEGATIVE

## 2021-05-10 MED ORDER — HYDRALAZINE HCL 20 MG/ML IJ SOLN: 10.0000 mg | INTRAMUSCULAR | Status: DC | PRN

## 2021-05-10 MED ORDER — LABETALOL HCL 5 MG/ML IV SOLN: 80.0000 mg | INTRAVENOUS | Status: DC | PRN

## 2021-05-10 MED ORDER — PANTOPRAZOLE SODIUM 20 MG PO TBEC
20.0000 mg | DELAYED_RELEASE_TABLET | Freq: Every day | ORAL | Status: DC
  Administered 2021-05-10 – 2021-05-15 (×6): 20 mg via ORAL
  Filled 2021-05-10 (×7): qty 1

## 2021-05-10 MED ORDER — LEVOTHYROXINE SODIUM 137 MCG PO TABS
137.0000 ug | ORAL_TABLET | Freq: Every day | ORAL | Status: DC
  Administered 2021-05-11 – 2021-05-15 (×5): 137 ug via ORAL
  Filled 2021-05-10 (×6): qty 1

## 2021-05-10 MED ORDER — DOCUSATE SODIUM 100 MG PO CAPS
100.0000 mg | ORAL_CAPSULE | Freq: Every day | ORAL | Status: DC
  Administered 2021-05-11 – 2021-05-13 (×3): 100 mg via ORAL
  Filled 2021-05-10 (×3): qty 1

## 2021-05-10 MED ORDER — PRENATAL MULTIVITAMIN CH
1.0000 | ORAL_TABLET | Freq: Every day | ORAL | Status: DC
  Administered 2021-05-11 – 2021-05-12 (×2): 1 via ORAL
  Filled 2021-05-10 (×2): qty 1

## 2021-05-10 MED ORDER — CALCIUM CARBONATE ANTACID 500 MG PO CHEW: 2.0000 | CHEWABLE_TABLET | ORAL | Status: DC | PRN

## 2021-05-10 MED ORDER — ACETAMINOPHEN 325 MG PO TABS
650.0000 mg | ORAL_TABLET | ORAL | Status: DC | PRN
  Administered 2021-05-10 – 2021-05-12 (×5): 650 mg via ORAL
  Filled 2021-05-10 (×5): qty 2

## 2021-05-10 MED ORDER — LABETALOL HCL 5 MG/ML IV SOLN: 20.0000 mg | INTRAVENOUS | Status: DC | PRN

## 2021-05-10 MED ORDER — LABETALOL HCL 5 MG/ML IV SOLN: 40.0000 mg | INTRAVENOUS | Status: DC | PRN

## 2021-05-10 MED ORDER — BETAMETHASONE SOD PHOS & ACET 6 (3-3) MG/ML IJ SUSP
12.0000 mg | INTRAMUSCULAR | Status: AC
  Administered 2021-05-10 – 2021-05-11 (×2): 12 mg via INTRAMUSCULAR
  Filled 2021-05-10: qty 5

## 2021-05-10 NOTE — MAU Provider Note (Signed)
History     CSN: [id number]  Arrival date and time: 05/10/21 1438   Event Date/Time   First Provider Initiated Contact with Patient 05/10/21 1619      Chief Complaint  Patient presents with   Hypertension   Ms. Katherine Marshall is a 35 y.o. [phone] at 44w4dwho presents to MAU for preeclampsia evaluation after she was seen in MFM today and found to have new onset elevated pressures. Patient reports her blood pressure began being elevated around one week ago. Patient reports 4/10 headache and seeing intermittent spots in her vision that started one week ago as well. Patient denies DFM. Patient reports history of preeclampsia requiring magnesium in previous pregnancy. Call from Dr. FAnnamaria Bootstoday [place] [facility] [place] [facility] received 10/10 on testing today.  Pt denies blurry vision, N/V, epigastric pain, swelling in face and hands, sudden weight gain. Pt denies chest pain and SOB.  Pt denies constipation, diarrhea, or urinary problems. Pt denies fever, chills, fatigue, sweating or changes in appetite. Pt denies dizziness, light-headedness, weakness.  Pt denies VB, ctx, LOF and reports good FM.   OB History     Gravida  9   Para  3   Term  2   Preterm  1   AB  5   Living  2      SAB  5   IAB  0   Ectopic  0   Multiple  0   Live Births  2           Past Medical History:  Diagnosis Date   Anxiety    GERD (gastroesophageal reflux disease)    [place]   Hypertension    PIH with last preg   Hypothyroidism    IBS (irritable bowel syndrome)    Kidney stones    Migraines    as a child   Pregnancy induced hypertension     Past Surgical History:  Procedure Laterality Date   BASAL CELL CARCINOMA EXCISION     DENTAL SURGERY      Family History  Problem Relation Age of Onset   Cancer Father        thyroid   Hypertension Father    Cancer Paternal Grandmother        colon   Hypertension Paternal Grandmother     Hypertension Mother    Hypertension Brother    Hypertension Maternal Grandmother    Heart attack Maternal Grandmother    Hypertension Maternal Grandfather    Heart attack Maternal Grandfather    Cancer Paternal Grandfather        brain   Hypertension Paternal Grandfather    Diabetes Maternal Uncle    Hearing loss Neg Hx     Social History   Tobacco Use   Smoking status: Never   Smokeless tobacco: Never  Vaping Use   Vaping Use: Never used  Substance Use Topics   Alcohol use: Not Currently    Alcohol/week: 3.0 - 4.0 standard drinks    Types: 3 - 4 Standard drinks or equivalent per week    Comment: occ glass of wine- not during pregnancy   Drug use: No    Allergies: No Known Allergies  Medications Prior to Admission  Medication Sig Dispense Refill Last Dose   acetaminophen (TYLENOL) 500 MG tablet Take 1,000 mg by mouth every 6 (six) hours as needed for mild pain, moderate pain or headache.    05/10/2021 at 1000   levothyroxine (SYNTHROID) 75  MCG tablet Take 75 mcg by mouth daily before breakfast.   05/10/2021   omeprazole (PRILOSEC) 10 MG capsule Take 10 mg by mouth daily.   05/10/2021   doxylamine, Sleep, (UNISOM) 25 MG tablet Take 12.5 mg by mouth at bedtime as needed for sleep.      hydroxyprogesterone caproate (MAKENA) 250 mg/mL OIL injection Inject 250 mg into the muscle once.      lansoprazole (PREVACID) 30 MG capsule Take 30 mg by mouth every evening. (Patient not taking: Reported on 03/29/2021)      LORazepam (ATIVAN) 1 MG tablet Take 1 tablet (1 mg total) by mouth every 8 (eight) hours as needed for anxiety. (Patient not taking: Reported on 03/29/2021) 30 tablet 0    Prenatal MV-Min-FA-Omega-3 (PRENATAL GUMMIES/DHA & FA) 0.4-32.5 MG CHEW Chew 2 each by mouth every evening.       Review of Systems  Constitutional:  Negative for chills, diaphoresis, fatigue and fever.  Eyes:  Positive for visual disturbance.  Respiratory:  Negative for shortness of breath.    Cardiovascular:  Negative for chest pain.  Gastrointestinal:  Negative for abdominal pain, constipation, diarrhea, nausea and vomiting.  Genitourinary:  Negative for dysuria, flank pain, frequency, pelvic pain, urgency, vaginal bleeding and vaginal discharge.  Neurological:  Positive for headaches. Negative for dizziness, weakness and light-headedness.   Physical Exam   Blood pressure (!) 158/108, pulse 92, temperature 98.5 F (36.9 C), temperature source Oral, resp. rate 16, last menstrual period 09/03/2020, SpO2 98 %, unknown if currently breastfeeding.  Patient Vitals for the past 24 hrs:  BP Temp Temp src Pulse Resp SpO2  05/10/21 1616 (!) 158/108 -- -- 92 -- 98 %  05/10/21 1601 (!) 146/96 -- -- 93 -- 98 %  05/10/21 1546 (!) 157/98 -- -- 100 -- 98 %  05/10/21 1531 (!) 137/107 -- -- 93 -- 98 %  05/10/21 1516 (!) 133/113 98.5 F (36.9 C) Oral 88 16 99 %   Physical Exam Vitals and nursing note reviewed.  Constitutional:      General: She is not in acute distress.    Appearance: Normal appearance. She is not ill-appearing, toxic-appearing or diaphoretic.  HENT:     Head: Normocephalic and atraumatic.  Pulmonary:     Effort: Pulmonary effort is normal.  Neurological:     Mental Status: She is alert and oriented to person, place, and time.  Psychiatric:        Mood and Affect: Mood normal.        Behavior: Behavior normal.        Thought Content: Thought content normal.        Judgment: Judgment normal.   Results for orders placed or performed during the hospital encounter of 05/10/21 (from the past 24 hour(s))  Urinalysis, Routine w reflex microscopic Urine, Clean Catch     Status: Abnormal   Collection Time: 05/10/21  2:55 PM  Result Value Ref Range   Color, Urine YELLOW YELLOW   APPearance CLOUDY (A) CLEAR   Specific Gravity, Urine 1.011 1.005 - 1.030   pH 7.0 5.0 - 8.0   Glucose, UA NEGATIVE NEGATIVE mg/dL   Hgb urine dipstick NEGATIVE NEGATIVE   Bilirubin Urine  NEGATIVE NEGATIVE   Ketones, ur NEGATIVE NEGATIVE mg/dL   Protein, ur NEGATIVE NEGATIVE mg/dL   Nitrite NEGATIVE NEGATIVE   Leukocytes,Ua LARGE (A) NEGATIVE   RBC / HPF 0-5 0 - 5 RBC/hpf   WBC, UA 21-50 0 - 5 WBC/hpf   Bacteria,  UA RARE (A) NONE SEEN   Squamous Epithelial / LPF 21-50 0 - 5   Mucus PRESENT    Non Squamous Epithelial 0-5 (A) NONE SEEN  Protein / creatinine ratio, urine     Status: Abnormal   Collection Time: 05/10/21  2:55 PM  Result Value Ref Range   Creatinine, Urine 52.81 mg/dL   Total Protein, Urine 16 mg/dL   Protein Creatinine Ratio 0.30 (H) 0.00 - 0.15 mg/mg[Cre]   [place] MFM FETAL BPP W/NONSTRESS  Result Date: 05/10/2021 ----------------------------------------------------------------------  OBSTETRICS REPORT                       (Signed Final 05/10/2021 04:29 pm) ---------------------------------------------------------------------- Patient Info  ID #:       [id number]                          D.O.B.:  Dec 31, 1985 (34 yrs)  Name:       Katherine Marshall                    Visit Date: 05/10/2021 01:16 pm ---------------------------------------------------------------------- Performed By  Attending:        Johnell Comings MD         Referred By:      Criss Alvine MODY  Performed By:     Jeanene Erb BS,      [facility]                                     [facility]                                                             [place] [facility]                                                             [facility] ---------------------------------------------------------------------- Orders  #  Description                           Code        Ordered By  1  [place] MFM OB FOLLOW UP                   76816.01    RAVI SHANKAR  2  [place] MFM UA CORD DOPPLER                76820.02    RAVI SHANKAR  3  [place] MFM FETAL BPP                      [id number]     RAVI SHANKAR     W/NONSTRESS ----------------------------------------------------------------------  #  Order #  Accession #                Episode #  1  [id number]                   [id number]                 [id number]  2  [id number]                   [id number]                 [id number]  3  [id number]                   [id number]                 [id number] ---------------------------------------------------------------------- Indications  Maternal care for known or suspected poor      O36.5930  fetal growth, third trimester, not applicable or  unspecified IUGR  [redacted] weeks gestation of pregnancy                Z3A.33  Advanced maternal age multigravida 3+,        O80.523  third trimester (33 yrs)  Cerebral ventriculomegaly                      O35.0XX0  Poor obstetric history: Previous midtrimester  O09.299  loss (21 week fetal demise)  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)  Anxiety during pregnancy, third trimester      O99.343, F41.9  Thyroid disease in pregnancy                   O99.280, E07.9  Poor obstetric history: Previous               O09.299  preeclampsia / eclampsia/gestational HTN  Obesity complicating pregnancy, third          O4.213  trimester  Encounter for other antenatal screening        Z36.2  follow-up ---------------------------------------------------------------------- Fetal Evaluation  Num Of Fetuses:         1  Fetal Heart Rate(bpm):  148  Cardiac Activity:       Observed  Presentation:           Cephalic  Placenta:               Posterior  P. Cord Insertion:      Previously Visualized  Amniotic Fluid  AFI FV:      Within normal limits  AFI Sum(cm)     %Tile       Largest Pocket(cm)  10.8            27          4.8  RUQ(cm)       RLQ(cm)       LUQ(cm)        LLQ(cm)  3.2           1.5           1.3            4.8 ---------------------------------------------------------------------- Biophysical Evaluation  Amniotic F.V:   Pocket => 2 cm             F. Tone:        Observed  F. Movement:    Observed  N.S.T:          Reactive   F. Breathing:   Observed                   Score:          10/10 ---------------------------------------------------------------------- Biometry  BPD:      82.8  mm     G. Age:  33w 2d          6  %    CI:        75.58   %    70 - 86                                                          FL/HC:      20.0   %    20.1 - 22.1  HC:       302   mm     G. Age:  33w 4d        < 1  %    HC/AC:      1.00        0.93 - 1.11  AC:      302.7  mm     G. Age:  34w 2d         21  %    FL/BPD:     72.8   %    71 - 87  FL:       60.3  mm     G. Age:  31w 3d        < 1  %    FL/AC:      19.9   %    20 - 24  Est. FW:    2157  gm    4 lb 12 oz       5  % ---------------------------------------------------------------------- OB History  Blood Type:   A+  Gravidity:    9         Term:   2        Prem:   1        SAB:   5  TOP:          0       Ectopic:  0        Living: 2 ---------------------------------------------------------------------- Gestational Age  LMP:           35w 4d        Date:  09/03/20                 EDD:   06/10/21  U/S Today:     33w 1d                                        EDD:   06/27/21  Best:          35w 4d     Det. By:  LMP  (09/03/20)          EDD:   06/10/21 ---------------------------------------------------------------------- Anatomy  Cranium:               Previously seen        LVOT:  Previously seen  Cavum:                 Appears normal         Aortic Arch:            Previously seen  Ventricles:            Appears normal         Ductal Arch:            Not well visualized  Choroid Plexus:        Previously seen        Diaphragm:              Appears normal  Cerebellum:            Previously seen        Stomach:                Appears normal, left                                                                        sided  Posterior Fossa:       Previously seen        Abdomen:                Appears normal  Nuchal Fold:           Not applicable (Q000111Q    Abdominal Wall:          Previously seen                         wks GA)  Face:                  Orbits and profile     Cord Vessels:           Previously seen                         previously seen  Lips:                  Previously seen        Kidneys:                Appear normal  Palate:                Not well visualized    Bladder:                Appears normal  Thoracic:              Previously seen        Spine:                  Limited views                                                                        previously seen  Heart:  Appears normal         Upper Extremities:      Visualized                         (4CH, axis, and                         situs)  RVOT:                  Previously seen        Lower Extremities:      Visualized  Other:  Technically difficult due to maternal habitus, advanced gestation age,          and fetal position. ---------------------------------------------------------------------- Targeted Anatomy  Thorax  3 Vessel View:         Previously seen ---------------------------------------------------------------------- Doppler - Fetal Vessels  Umbilical Artery   S/D     %tile      RI    %tile      PI    %tile     PSV    ADFV    RDFV                                                     (cm/s)   2.95       79    0.66       83    1.08       90    38.63      No      No ---------------------------------------------------------------------- Cervix Uterus Adnexa  Cervix  Not visualized (advanced GA >24wks) ---------------------------------------------------------------------- Comments  This patient was seen for a follow up growth scan due to fetal  growth restriction noted during her prior ultrasound exams.  She denies any problems since her last exam and reports  feeling vigorous fetal movements throughout the day.  The  patient's blood pressures in our office today were 147/85 and  159/104.  She reports that she has been experiencing on and  off headaches along with seeing gray floaters  for the past few  days.  She does have a history of preeclampsia in her prior  pregnancy.  On today's exam, the EFW measures at the 5th percentile for  her gestational age indicating fetal growth restriction.  The  fetus has grown over 1 pound over the past 3 weeks.  There  was normal amniotic fluid noted.  A biophysical profile performed today due to fetal growth  restriction was 8 out of 8.  She also had a reactive NST  following her ultrasound exam making her total biophysical  profile score 10 out of 10.  Doppler studies of the umbilical arteries showed a normal  S/D ratio of 2.95.  There were no signs of absent or reversed  end-diastolic flow.  Due to concerns regarding preeclampsia due to her elevated  blood pressures and symptoms, the patient was sent to the  MAU for further evaluation.  Should she be diagnosed with  preeclampsia, delivery would be recommended once she  receives a complete course of antenatal corticosteroids.  Should preeclampsia be ruled out and she is discharged  home, the patient already has an induction scheduled on  May 20, 2021.  She is already scheduled for another  biophysical profile in  your office next week. ----------------------------------------------------------------------                   Johnell Comings, MD Electronically Signed Final Report   05/10/2021 04:29 pm ----------------------------------------------------------------------  [place] MFM FETAL BPP WO NON STRESS  Result Date: 04/19/2021 ----------------------------------------------------------------------  OBSTETRICS REPORT                       (Signed Final 04/19/2021 03:24 pm) ---------------------------------------------------------------------- Patient Info  ID #:       [id number]                          D.O.B.:  [birth year] (34 yrs)  Name:       Katherine Marshall                    Visit Date: 04/19/2021 01:56 pm ---------------------------------------------------------------------- Performed By  Attending:        Tama High MD        Referred By:      Criss Alvine MODY  Performed By:     Germain Osgood            Location[facility]                    [facility]                                     [facility]                                                             [place] for                                                             Women ---------------------------------------------------------------------- Orders  #  Description                           Code        Ordered By  1  [place] MFM FETAL BPP WO NON               76819.01    [place]  2  [place] MFM OB FOLLOW UP                   76816.01    RAVI SHANKAR  3  [place] MFM UA CORD DOPPLER                [id number]    RAVI [facility] ----------------------------------------------------------------------  #  Order #                     Accession #                Episode #  1  [id number]  [id number]                 [id number]  2  [id number]                   [id number]                 [id number]  3  [id number]                   [id number]                 [id number] ---------------------------------------------------------------------- Indications  Maternal care for known or suspected poor      O36.5930  fetal growth, third trimester, not applicable or  unspecified IUGR  Advanced maternal age multigravida 74+,        O49.523  third trimester  Cerebral ventriculomegaly                      O35.0XX0  Poor obstetric history: Previous midtrimester  O09.299  loss (21 week fetal demise)  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)  Anxiety during pregnancy, third trimester      O99.343, F41.9  Thyroid disease in pregnancy                   O99.280, E07.9  Poor obstetric history: Previous               O09.299  preeclampsia / eclampsia/gestational HTN  Encounter for antenatal screening for          Z36.3  malformations  Obesity complicating pregnancy, third          O99.213  trimester  [redacted] weeks gestation of pregnancy                 Z3A.32 ---------------------------------------------------------------------- Fetal Evaluation  Num Of Fetuses:         1  Fetal Heart Rate(bpm):  189  Cardiac Activity:       Observed  Presentation:           Cephalic  Placenta:               Posterior  P. Cord Insertion:      Previously Visualized  Amniotic Fluid  AFI FV:      Within normal limits  AFI Sum(cm)     %Tile       Largest Pocket(cm)  9.72            15          3.71  RUQ(cm)       RLQ(cm)       LUQ(cm)        LLQ(cm)  2.27          3.71          1.87           1.87  Comment:    Placenta appears thickened 6.3cm. ---------------------------------------------------------------------- Biophysical Evaluation  Amniotic F.V:   Pocket => 2 cm             F. Tone:        Observed  F. Movement:    Observed                   Score:          8/8  F. Breathing:   Observed ---------------------------------------------------------------------- Biometry  BPD:      75.4  mm  G. Age:  30w 2d        1.8  %    CI:        71.52   %    70 - 86                                                          FL/HC:      19.0   %    19.9 - 21.5  HC:      283.9  mm     G. Age:  31w 1d        1.6  %    HC/AC:      1.05        0.96 - 1.11  AC:      269.5  mm     G. Age:  31w 0d         12  %    FL/BPD:     71.5   %    71 - 87  FL:       53.9  mm     G. Age:  28w 4d        < 1  %    FL/AC:      20.0   %    20 - 24  HUM:      49.7  mm     G. Age:  29w 1d        < 5  %  LV:        6.8  mm  Est. FW:    1531  gm      3 lb 6 oz      2  % ---------------------------------------------------------------------- OB History  Blood Type:   A+  Gravidity:    9         Term:   2        Prem:   1        SAB:   5  TOP:          0       Ectopic:  0        Living: 2 ---------------------------------------------------------------------- Gestational Age  LMP:           32w 4d        Date:  09/03/20                 EDD:   06/10/21  U/S Today:     30w 2d                                         EDD:   06/26/21  Best:          32w 4d     Det. By:  LMP  (09/03/20)          EDD:   06/10/21 ---------------------------------------------------------------------- Anatomy  Cranium:               Previously seen        LVOT:                   Previously seen  Cavum:  Appears normal         Aortic Arch:            Previously seen  Ventricles:            Appears normal         Ductal Arch:            Not well visualized  Choroid Plexus:        Previously seen        Diaphragm:              Appears normal  Cerebellum:            Previously seen        Stomach:                Appears normal, left                                                                        sided  Posterior Fossa:       Previously seen        Abdomen:                Appears normal  Nuchal Fold:           Not applicable (Q000111Q    Abdominal Wall:         Previously seen                         wks GA)  Face:                  Orbits and profile     Cord Vessels:           Previously seen                         previously seen  Lips:                  Appears normal         Kidneys:                Appear normal  Palate:                Not well visualized    Bladder:                Appears normal  Thoracic:              Previously seen        Spine:                  Limited views                                                                        appear normal  Heart:                 Appears normal  Upper Extremities:      Visualized                         (4CH, axis, and                         situs)  RVOT:                  Previously seen        Lower Extremities:      Visualized  Other:  Technically difficult due to maternal habitus, advanced gestation age,          and fetal position. 3VV visualized.Lt Ventricle measuring 7.18m. RK          5.784mand LK 5.35m435m---------------------------------------------------------------------- Doppler - Fetal Vessels  Umbilical Artery   S/D     %tile      RI    %tile      PI    %tile             ADFV    RDFV   3.11       77    0.68       80    1.06       80               No      No ---------------------------------------------------------------------- Cervix Uterus Adnexa  Cervix  Not visualized (advanced GA >24wks)  Uterus  No abnormality visualized.  Right Ovary  Not visualized.  Left Ovary  Not visualized.  Cul De Sac  No free fluid seen.  Adnexa  No adnexal mass visualized. ---------------------------------------------------------------------- Impression  Severe fetal growth restriction.  Ventriculomegaly was seen  on previous ultrasound.  Patient has been having antenatal  testing at your office.  On today's ultrasound, the estimated fetal weight is at the 2nd  percentile.  Interval weight gain is 498 g over 3 weeks  (suboptimal but acceptable weight gain).  Head  circumference measurement is at -1 SD.  Amniotic fluid is  normal good fetal activity seen.  Umbilical artery Doppler  showed normal forward diastolic flow.  Antenatal testing is  reassuring.  BPP 8/8.  Lateral ventricular measurements are normal with no  evidence of ventriculomegaly.  I reassured the patient of the findings. ---------------------------------------------------------------------- Recommendations  -Weekly antenatal testing at your office to continue.  -Fetal growth assessment and antenatal testing at our office  in 3 weeks.  -Delivery at [redacted] weeks gestation. ----------------------------------------------------------------------                  RavTama HighD Electronically Signed Final Report   04/19/2021 03:24 pm ----------------------------------------------------------------------  [place] OB FOLLOW UP  Result Date: 05/10/2021 ----------------------------------------------------------------------  OBSTETRICS REPORT                       (Signed Final 05/10/2021 04:29 pm) ---------------------------------------------------------------------- Patient Info  ID #:       [id number]                       D.O.B.:   11/01-05-19874 yrs)  Name:       LAUBetsey Marshall                 Visit Date: 05/10/2021 01:16 pm ---------------------------------------------------------------------- Performed By  Attending:        VicJohnell Comings  MD         Referred By:      Criss Alvine MODY  Performed By:     Jeanene Erb BS,      Location[facility]         [facility]                    RD[facility]                                     [facility] at                                                             [place] for                                                             Women ---------------------------------------------------------------------- Orders  #  Description                           Code        Ordered By  1  [place] MFM OB FOLLOW UP                   76816.01    RAVI SHANKAR  2  [place] MFM UA CORD DOPPLER                76820.02    RAVI SHANKAR  3  [place] MFM FETAL BPP                      [id number]     RAVI [facility]     W/NONSTRESS ----------------------------------------------------------------------  #  Order #                     Accession #                Episode #  1  [id number]                   [id number]                 [id number]  2  [id number]                   [id number]                 [id number]  3  [id number]                   [id number]                 [id number] ---------------------------------------------------------------------- Indications  Maternal care for known or suspected poor      O36.5930  fetal growth, third trimester, not applicable or  unspecified IUGR  [redacted] weeks gestation of pregnancy                Z3A.23  Advanced maternal age multigravida 81+,        O51.523  third trimester (34 yrs)  Cerebral ventriculomegaly                      O35.0XX0  Poor obstetric history: Previous midtrimester  O09.299  loss (21 week fetal demise)  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)  Anxiety during pregnancy, third trimester      O99.343, F41.9  Thyroid disease in pregnancy                   O99.280,  E07.9  Poor obstetric history: Previous               O09.299  preeclampsia / eclampsia/gestational HTN  Obesity complicating pregnancy, third          O99.213  trimester  Encounter for other antenatal screening        Z36.2  follow-up ---------------------------------------------------------------------- Fetal Evaluation  Num Of Fetuses:         1  Fetal Heart Rate(bpm):  148  Cardiac Activity:       Observed  Presentation:           Cephalic  Placenta:               Posterior  P. Cord Insertion:      Previously Visualized  Amniotic Fluid  AFI FV:      Within normal limits  AFI Sum(cm)     %Tile       Largest Pocket(cm)  10.8            27          4.8  RUQ(cm)       RLQ(cm)       LUQ(cm)        LLQ(cm)  3.2           1.5           1.3            4.8 ---------------------------------------------------------------------- Biophysical Evaluation  Amniotic F.V:   Pocket => 2 cm             F. Tone:        Observed  F. Movement:    Observed                   N.S.T:          Reactive  F. Breathing:   Observed                   Score:          10/10 ---------------------------------------------------------------------- Biometry  BPD:      82.8  mm     G. Age:  33w 2d          6  %    CI:        75.58   %    70 - 86                                                          FL/HC:      20.0   %    20.1 - 22.1  HC:       302   mm     G. Age:  33w 4d        <  1  %    HC/AC:      1.00        0.93 - 1.11  AC:      302.7  mm     G. Age:  34w 2d         21  %    FL/BPD:     72.8   %    71 - 87  FL:       60.3  mm     G. Age:  31w 3d        < 1  %    FL/AC:      19.9   %    20 - 24  Est. FW:    2157  gm    4 lb 12 oz       5  % ---------------------------------------------------------------------- OB History  Blood Type:   A+  Gravidity:    9         Term:   2        Prem:   1        SAB:   5  TOP:          0       Ectopic:  0        Living: 2 ---------------------------------------------------------------------- Gestational Age   LMP:           35w 4d        Date:  09/03/20                 EDD:   06/10/21  U/S Today:     33w 1d                                        EDD:   06/27/21  Best:          35w 4d     Det. By:  LMP  (09/03/20)          EDD:   06/10/21 ---------------------------------------------------------------------- Anatomy  Cranium:               Previously seen        LVOT:                   Previously seen  Cavum:                 Appears normal         Aortic Arch:            Previously seen  Ventricles:            Appears normal         Ductal Arch:            Not well visualized  Choroid Plexus:        Previously seen        Diaphragm:              Appears normal  Cerebellum:            Previously seen        Stomach:                Appears normal, left  sided  Posterior Fossa:       Previously seen        Abdomen:                Appears normal  Nuchal Fold:           Not applicable (Q000111Q    Abdominal Wall:         Previously seen                         wks GA)  Face:                  Orbits and profile     Cord Vessels:           Previously seen                         previously seen  Lips:                  Previously seen        Kidneys:                Appear normal  Palate:                Not well visualized    Bladder:                Appears normal  Thoracic:              Previously seen        Spine:                  Limited views                                                                        previously seen  Heart:                 Appears normal         Upper Extremities:      Visualized                         (4CH, axis, and                         situs)  RVOT:                  Previously seen        Lower Extremities:      Visualized  Other:  Technically difficult due to maternal habitus, advanced gestation age,          and fetal position. ---------------------------------------------------------------------- Targeted Anatomy  Thorax  3  Vessel View:         Previously seen ---------------------------------------------------------------------- Doppler - Fetal Vessels  Umbilical Artery   S/D     %tile      RI    %tile      PI    %tile     PSV    ADFV    RDFV                                                     (  cm/s)   2.95       79    0.66       83    1.08       90    38.63      No      No ---------------------------------------------------------------------- Cervix Uterus Adnexa  Cervix  Not visualized (advanced GA >24wks) ---------------------------------------------------------------------- Comments  This patient was seen for a follow up growth scan due to fetal  growth restriction noted during her prior ultrasound exams.  She denies any problems since her last exam and reports  feeling vigorous fetal movements throughout the day.  The  patient's blood pressures in our office today were 147/85 and  159/104.  She reports that she has been experiencing on and  off headaches along with seeing gray floaters for the past few  days.  She does have a history of preeclampsia in her prior  pregnancy.  On today's exam, the EFW measures at the 5th percentile for  her gestational age indicating fetal growth restriction.  The  fetus has grown over 1 pound over the past 3 weeks.  There  was normal amniotic fluid noted.  A biophysical profile performed today due to fetal growth  restriction was 8 out of 8.  She also had a reactive NST  following her ultrasound exam making her total biophysical  profile score 10 out of 10.  Doppler studies of the umbilical arteries showed a normal  S/D ratio of 2.95.  There were no signs of absent or reversed  end-diastolic flow.  Due to concerns regarding preeclampsia due to her elevated  blood pressures and symptoms, the patient was sent to the  MAU for further evaluation.  Should she be diagnosed with  preeclampsia, delivery would be recommended once she  receives a complete course of antenatal corticosteroids.  Should  preeclampsia be ruled out and she is discharged  home, the patient already has an induction scheduled on  May 20, 2021.  She is already scheduled for another  biophysical profile in your office next week. ----------------------------------------------------------------------                   Johnell Comings, MD Electronically Signed Final Report   05/10/2021 04:29 pm ----------------------------------------------------------------------  [place] MFM OB FOLLOW UP  Result Date: 04/19/2021 ----------------------------------------------------------------------  OBSTETRICS REPORT                       (Signed Final 04/19/2021 03:24 pm) ---------------------------------------------------------------------- Patient Info  ID #:       [id number]                          D.O.B.:  1985-11-04 (34 yrs)  Name:       Katherine Marshall                    Visit Date: 04/19/2021 01:56 pm ---------------------------------------------------------------------- Performed By  Attending:        Tama High MD        Referred By:      Criss Alvine MODY  Performed By:     Germain Osgood            [facility]                                                             [place] [facility]                                                             [facility] ---------------------------------------------------------------------- Orders  #  Description                           Code        Ordered By  1  [place] MFM FETAL BPP WO NON               76819.01    RAVI SHANKAR     STRESS  2  [place] MFM OB FOLLOW UP                   76816.01    RAVI SHANKAR  3  [place] MFM UA CORD DOPPLER                76820.02    [facility] ----------------------------------------------------------------------  #  Order #                     Accession #                Episode #  1  [id number]                   [id number]                 [id number]  2  [id number]                   [id number]                  [id number]  3  [id number]                   [id number]                 [id number] ---------------------------------------------------------------------- Indications  Maternal care for known or suspected poor      O36.5930  fetal growth, third trimester, not applicable or  unspecified IUGR  Advanced maternal age multigravida 34+,        O30.523  third trimester  Cerebral ventriculomegaly                      O35.0XX0  Poor obstetric history: Previous midtrimester  O09.299  loss (21 week fetal demise)  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)  Anxiety during pregnancy, third trimester      O99.343, F41.9  Thyroid disease in pregnancy                   O99.280, E07.9  Poor obstetric history: Previous               O09.299  preeclampsia / eclampsia/gestational HTN  Encounter for antenatal screening for  Z36.3  malformations  Obesity complicating pregnancy, third          O99.213  trimester  [redacted] weeks gestation of pregnancy                Z3A.32 ---------------------------------------------------------------------- Fetal Evaluation  Num Of Fetuses:         1  Fetal Heart Rate(bpm):  189  Cardiac Activity:       Observed  Presentation:           Cephalic  Placenta:               Posterior  P. Cord Insertion:      Previously Visualized  Amniotic Fluid  AFI FV:      Within normal limits  AFI Sum(cm)     %Tile       Largest Pocket(cm)  9.72            15          3.71  RUQ(cm)       RLQ(cm)       LUQ(cm)        LLQ(cm)  2.27          3.71          1.87           1.87  Comment:    Placenta appears thickened 6.3cm. ---------------------------------------------------------------------- Biophysical Evaluation  Amniotic F.V:   Pocket => 2 cm             F. Tone:        Observed  F. Movement:    Observed                   Score:          8/8  F. Breathing:   Observed ---------------------------------------------------------------------- Biometry  BPD:      75.4  mm     G. Age:  30w  2d        1.8  %    CI:        71.52   %    70 - 86                                                          FL/HC:      19.0   %    19.9 - 21.5  HC:      283.9  mm     G. Age:  31w 1d        1.6  %    HC/AC:      1.05        0.96 - 1.11  AC:      269.5  mm     G. Age:  31w 0d         12  %    FL/BPD:     71.5   %    71 - 87  FL:       53.9  mm     G. Age:  28w 4d        < 1  %    FL/AC:      20.0   %    20 - 24  HUM:      49.7  mm     G. Age:  29w 1d        < 5  %  LV:        6.8  mm  Est. FW:    1531  gm      3 lb 6 oz      2  % ---------------------------------------------------------------------- OB History  Blood Type:   A+  Gravidity:    9         Term:   2        Prem:   1        SAB:   5  TOP:          0       Ectopic:  0        Living: 2 ---------------------------------------------------------------------- Gestational Age  LMP:           32w 4d        Date:  09/03/20                 EDD:   06/10/21  U/S Today:     30w 2d                                        EDD:   06/26/21  Best:          32w 4d     Det. By:  LMP  (09/03/20)          EDD:   06/10/21 ---------------------------------------------------------------------- Anatomy  Cranium:               Previously seen        LVOT:                   Previously seen  Cavum:                 Appears normal         Aortic Arch:            Previously seen  Ventricles:            Appears normal         Ductal Arch:            Not well visualized  Choroid Plexus:        Previously seen        Diaphragm:              Appears normal  Cerebellum:            Previously seen        Stomach:                Appears normal, left                                                                        sided  Posterior Fossa:       Previously seen        Abdomen:                Appears normal  Nuchal Fold:           Not applicable (Q000111Q    Abdominal Wall:  Previously seen                         wks GA)  Face:                  Orbits and profile     Cord Vessels:            Previously seen                         previously seen  Lips:                  Appears normal         Kidneys:                Appear normal  Palate:                Not well visualized    Bladder:                Appears normal  Thoracic:              Previously seen        Spine:                  Limited views                                                                        appear normal  Heart:                 Appears normal         Upper Extremities:      Visualized                         (4CH, axis, and                         situs)  RVOT:                  Previously seen        Lower Extremities:      Visualized  Other:  Technically difficult due to maternal habitus, advanced gestation age,          and fetal position. 3VV visualized.Lt Ventricle measuring 7.30m. RK          5.749mand LK 5.25m39m---------------------------------------------------------------------- Doppler - Fetal Vessels  Umbilical Artery   S/D     %tile      RI    %tile      PI    %tile            ADFV    RDFV   3.11       77    0.68       80    1.06       80               No      No ---------------------------------------------------------------------- Cervix Uterus Adnexa  Cervix  Not visualized (advanced GA >24wks)  Uterus  No abnormality visualized.  Right Ovary  Not visualized.  Left Ovary  Not visualized.  Cul De Sac  No free fluid seen.  Adnexa  No adnexal mass visualized. ---------------------------------------------------------------------- Impression  Severe fetal growth restriction.  Ventriculomegaly was seen  on previous ultrasound.  Patient has been having antenatal  testing at your office.  On today's ultrasound, the estimated fetal weight is at the 2nd  percentile.  Interval weight gain is 498 g over 3 weeks  (suboptimal but acceptable weight gain).  Head  circumference measurement is at -1 SD.  Amniotic fluid is  normal good fetal activity seen.  Umbilical artery Doppler  showed normal forward diastolic flow.   Antenatal testing is  reassuring.  BPP 8/8.  Lateral ventricular measurements are normal with no  evidence of ventriculomegaly.  I reassured the patient of the findings. ---------------------------------------------------------------------- Recommendations  -Weekly antenatal testing at your office to continue.  -Fetal growth assessment and antenatal testing at our office  in 3 weeks.  -Delivery at [redacted] weeks gestation. ----------------------------------------------------------------------                  Tama High, MD Electronically Signed Final Report   04/19/2021 03:24 pm ----------------------------------------------------------------------  [place] MFM UA CORD DOPPLER  Result Date: 05/10/2021 ----------------------------------------------------------------------  OBSTETRICS REPORT                       (Signed Final 05/10/2021 04:29 pm) ---------------------------------------------------------------------- Patient Info  ID #:       [id number]                          D.O.B.:  Dec 08, 1985 (34 yrs)  Name:       Katherine Marshall                    Visit Date: 05/10/2021 01:16 pm ---------------------------------------------------------------------- Performed By  Attending:        Johnell Comings MD         Referred By:      Criss Alvine MODY  Performed By:     Jeanene Erb BS,      Location[facility]                    [facility]                                     [facility] at                                                             [place] for                                                             Women ---------------------------------------------------------------------- Orders  #  Description                           Code        Ordered By  1  [place] MFM OB FOLLOW UP                   [id number]    RAVI SHANKAR  2  [place] MFM UA CORD DOPPLER                76820.02    RAVI SHANKAR  3  [place] MFM FETAL BPP                      76818.5     [facility]VI [facility]     W/NONSTRESS  ----------------------------------------------------------------------  #  Order #                     Accession #                Episode #  1  [id number]                   [id number]                 [id number]  2  [id number]                   [id number]                 [id number]  3  [id number]                   [id number]                 [id number] ---------------------------------------------------------------------- Indications  Maternal care for known or suspected poor      O36.5930  fetal growth, third trimester, not applicable or  unspecified IUGR  [redacted] weeks gestation of pregnancy                Z3A.32  Advanced maternal age multigravida 43+,        O91.523  third trimester (54 yrs)  Cerebral ventriculomegaly                      O35.0XX0  Poor obstetric history: Previous midtrimester  O09.299  loss (21 week fetal demise)  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)  Anxiety during pregnancy, third trimester      O99.343, F41.9  Thyroid disease in pregnancy                   O99.280, E07.9  Poor obstetric history: Previous               O09.299  preeclampsia / eclampsia/gestational HTN  Obesity complicating pregnancy, third          O19.213  trimester  Encounter for other antenatal screening        Z36.2  follow-up ---------------------------------------------------------------------- Fetal Evaluation  Num Of Fetuses:         1  Fetal Heart Rate(bpm):  148  Cardiac Activity:       Observed  Presentation:           Cephalic  Placenta:               Posterior  P. Cord Insertion:      Previously Visualized  Amniotic Fluid  AFI FV:      Within normal limits  AFI Sum(cm)     %Tile       Largest Pocket(cm)  10.8            27  4.8  RUQ(cm)       RLQ(cm)       LUQ(cm)        LLQ(cm)  3.2           1.5           1.3            4.8 ---------------------------------------------------------------------- Biophysical Evaluation  Amniotic F.V:   Pocket => 2 cm             F. Tone:         Observed  F. Movement:    Observed                   N.S.T:          Reactive  F. Breathing:   Observed                   Score:          10/10 ---------------------------------------------------------------------- Biometry  BPD:      82.8  mm     G. Age:  33w 2d          6  %    CI:        75.58   %    70 - 86                                                          FL/HC:      20.0   %    20.1 - 22.1  HC:       302   mm     G. Age:  33w 4d        < 1  %    HC/AC:      1.00        0.93 - 1.11  AC:      302.7  mm     G. Age:  34w 2d         21  %    FL/BPD:     72.8   %    71 - 87  FL:       60.3  mm     G. Age:  31w 3d        < 1  %    FL/AC:      19.9   %    20 - 24  Est. FW:    2157  gm    4 lb 12 oz       5  % ---------------------------------------------------------------------- OB History  Blood Type:   A+  Gravidity:    9         Term:   2        Prem:   1        SAB:   5  TOP:          0       Ectopic:  0        Living: 2 ---------------------------------------------------------------------- Gestational Age  LMP:           35w 4d        Date:  09/03/20                 EDD:   06/10/21  U/S Today:  33w 1d                                        EDD:   06/27/21  Best:          35w 4d     Det. By:  LMP  (09/03/20)          EDD:   06/10/21 ---------------------------------------------------------------------- Anatomy  Cranium:               Previously seen        LVOT:                   Previously seen  Cavum:                 Appears normal         Aortic Arch:            Previously seen  Ventricles:            Appears normal         Ductal Arch:            Not well visualized  Choroid Plexus:        Previously seen        Diaphragm:              Appears normal  Cerebellum:            Previously seen        Stomach:                Appears normal, left                                                                        sided  Posterior Fossa:       Previously seen        Abdomen:                Appears  normal  Nuchal Fold:           Not applicable (Q000111Q    Abdominal Wall:         Previously seen                         wks GA)  Face:                  Orbits and profile     Cord Vessels:           Previously seen                         previously seen  Lips:                  Previously seen        Kidneys:                Appear normal  Palate:                Not well visualized    Bladder:  Appears normal  Thoracic:              Previously seen        Spine:                  Limited views                                                                        previously seen  Heart:                 Appears normal         Upper Extremities:      Visualized                         (4CH, axis, and                         situs)  RVOT:                  Previously seen        Lower Extremities:      Visualized  Other:  Technically difficult due to maternal habitus, advanced gestation age,          and fetal position. ---------------------------------------------------------------------- Targeted Anatomy  Thorax  3 Vessel View:         Previously seen ---------------------------------------------------------------------- Doppler - Fetal Vessels  Umbilical Artery   S/D     %tile      RI    %tile      PI    %tile     PSV    ADFV    RDFV                                                     (cm/s)   2.95       79    0.66       83    1.08       90    38.63      No      No ---------------------------------------------------------------------- Cervix Uterus Adnexa  Cervix  Not visualized (advanced GA >24wks) ---------------------------------------------------------------------- Comments  This patient was seen for a follow up growth scan due to fetal  growth restriction noted during her prior ultrasound exams.  She denies any problems since her last exam and reports  feeling vigorous fetal movements throughout the day.  The  patient's blood pressures in our office today were 147/85 and  159/104.  She reports that she  has been experiencing on and  off headaches along with seeing gray floaters for the past few  days.  She does have a history of preeclampsia in her prior  pregnancy.  On today's exam, the EFW measures at the 5th percentile for  her gestational age indicating fetal growth restriction.  The  fetus has grown over 1 pound over the past 3 weeks.  There  was normal amniotic fluid noted.  A biophysical profile performed today due to fetal growth  restriction was  8 out of 8.  She also had a reactive NST  following her ultrasound exam making her total biophysical  profile score 10 out of 10.  Doppler studies of the umbilical arteries showed a normal  S/D ratio of 2.95.  There were no signs of absent or reversed  end-diastolic flow.  Due to concerns regarding preeclampsia due to her elevated  blood pressures and symptoms, the patient was sent to the  MAU for further evaluation.  Should she be diagnosed with  preeclampsia, delivery would be recommended once she  receives a complete course of antenatal corticosteroids.  Should preeclampsia be ruled out and she is discharged  home, the patient already has an induction scheduled on  May 20, 2021.  She is already scheduled for another  biophysical profile in your office next week. ----------------------------------------------------------------------                   Johnell Comings, MD Electronically Signed Final Report   05/10/2021 04:29 pm ----------------------------------------------------------------------  [place] MFM UA CORD DOPPLER  Result Date: 04/19/2021 ----------------------------------------------------------------------  OBSTETRICS REPORT                       (Signed Final 04/19/2021 03:24 pm) ---------------------------------------------------------------------- Patient Info  ID #:       [id number]                          D.O.B.:  08/10/1986 (34 yrs)  Name:       Katherine Marshall                    Visit Date: 04/19/2021 01:56 pm  ---------------------------------------------------------------------- Performed By  Attending:        Tama High MD        Referred By:      Criss Alvine MODY  Performed By:     Germain Osgood            [facility]                                                             [place] [facility]                                                             [facility] ---------------------------------------------------------------------- Orders  #  Description                           Code        Ordered By  1  [place] MFM FETAL BPP WO NON  [id number]    Quesada  2  [place] MFM OB FOLLOW UP                   76816.01    RAVI SHANKAR  3  [place] MFM UA CORD DOPPLER                [id number]    [facility] ----------------------------------------------------------------------  #  Order #                     Accession #                Episode #  1  [id number]                   [id number]                 [id number]  2  [id number]                   [id number]                 [id number]  3  [id number]                   [id number]                 [id number] ---------------------------------------------------------------------- Indications  Maternal care for known or suspected poor      O36.5930  fetal growth, third trimester, not applicable or  unspecified IUGR  Advanced maternal age multigravida 87+,        O51.523  third trimester  Cerebral ventriculomegaly                      O35.0XX0  Poor obstetric history: Previous midtrimester  O09.299  loss (21 week fetal demise)  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)  Anxiety during pregnancy, third trimester      O99.343, F41.9  Thyroid disease in pregnancy                   O99.280, E07.9  Poor obstetric history: Previous               O09.299  preeclampsia / eclampsia/gestational HTN  Encounter for antenatal screening for          Z36.3   malformations  Obesity complicating pregnancy, third          O99.213  trimester  [redacted] weeks gestation of pregnancy                Z3A.32 ---------------------------------------------------------------------- Fetal Evaluation  Num Of Fetuses:         1  Fetal Heart Rate(bpm):  189  Cardiac Activity:       Observed  Presentation:           Cephalic  Placenta:               Posterior  P. Cord Insertion:      Previously Visualized  Amniotic Fluid  AFI FV:      Within normal limits  AFI Sum(cm)     %Tile       Largest Pocket(cm)  9.72            15          3.71  RUQ(cm)       RLQ(cm)       LUQ(cm)  LLQ(cm)  2.27          3.71          1.87           1.87  Comment:    Placenta appears thickened 6.3cm. ---------------------------------------------------------------------- Biophysical Evaluation  Amniotic F.V:   Pocket => 2 cm             F. Tone:        Observed  F. Movement:    Observed                   Score:          8/8  F. Breathing:   Observed ---------------------------------------------------------------------- Biometry  BPD:      75.4  mm     G. Age:  30w 2d        1.8  %    CI:        71.52   %    70 - 86                                                          FL/HC:      19.0   %    19.9 - 21.5  HC:      283.9  mm     G. Age:  31w 1d        1.6  %    HC/AC:      1.05        0.96 - 1.11  AC:      269.5  mm     G. Age:  31w 0d         12  %    FL/BPD:     71.5   %    71 - 87  FL:       53.9  mm     G. Age:  28w 4d        < 1  %    FL/AC:      20.0   %    20 - 24  HUM:      49.7  mm     G. Age:  29w 1d        < 5  %  LV:        6.8  mm  Est. FW:    1531  gm      3 lb 6 oz      2  % ---------------------------------------------------------------------- OB History  Blood Type:   A+  Gravidity:    9         Term:   2        Prem:   1        SAB:   5  TOP:          0       Ectopic:  0        Living: 2 ---------------------------------------------------------------------- Gestational Age  LMP:           32w 4d         Date:  09/03/20                 EDD:   06/10/21  U/S Today:     30w 2d  EDD:   06/26/21  Best:          Milderd Meager 4d     Det. By:  LMP  (09/03/20)          EDD:   06/10/21 ---------------------------------------------------------------------- Anatomy  Cranium:               Previously seen        LVOT:                   Previously seen  Cavum:                 Appears normal         Aortic Arch:            Previously seen  Ventricles:            Appears normal         Ductal Arch:            Not well visualized  Choroid Plexus:        Previously seen        Diaphragm:              Appears normal  Cerebellum:            Previously seen        Stomach:                Appears normal, left                                                                        sided  Posterior Fossa:       Previously seen        Abdomen:                Appears normal  Nuchal Fold:           Not applicable ([id number]    Abdominal Wall:         Previously seen                         wks GA)  Face:                  Orbits and profile     Cord Vessels:           Previously seen                         previously seen  Lips:                  Appears normal         Kidneys:                Appear normal  Palate:                Not well visualized    Bladder:                Appears normal  Thoracic:              Previously seen        Spine:  Limited views                                                                        appear normal  Heart:                 Appears normal         Upper Extremities:      Visualized                         (4CH, axis, and                         situs)  RVOT:                  Previously seen        Lower Extremities:      Visualized  Other:  Technically difficult due to maternal habitus, advanced gestation age,          and fetal position. 3VV visualized.Lt Ventricle measuring 7.82m. RK          5.727mand LK 5.10m85m ---------------------------------------------------------------------- Doppler - Fetal Vessels  Umbilical Artery   S/D     %tile      RI    %tile      PI    %tile            ADFV    RDFV   3.11       77    0.68       80    1.06       80               No      No ---------------------------------------------------------------------- Cervix Uterus Adnexa  Cervix  Not visualized (advanced GA >24wks)  Uterus  No abnormality visualized.  Right Ovary  Not visualized.  Left Ovary  Not visualized.  Cul De Sac  No free fluid seen.  Adnexa  No adnexal mass visualized. ---------------------------------------------------------------------- Impression  Severe fetal growth restriction.  Ventriculomegaly was seen  on previous ultrasound.  Patient has been having antenatal  testing at your office.  On today's ultrasound, the estimated fetal weight is at the 2nd  percentile.  Interval weight gain is 498 g over 3 weeks  (suboptimal but acceptable weight gain).  Head  circumference measurement is at -1 SD.  Amniotic fluid is  normal good fetal activity seen.  Umbilical artery Doppler  showed normal forward diastolic flow.  Antenatal testing is  reassuring.  BPP 8/8.  Lateral ventricular measurements are normal with no  evidence of ventriculomegaly.  I reassured the patient of the findings. ---------------------------------------------------------------------- Recommendations  -Weekly antenatal testing at your office to continue.  -Fetal growth assessment and antenatal testing at our office  in 3 weeks.  -Delivery at [redacted] weeks gestation. ----------------------------------------------------------------------                  RavTama HighD Electronically Signed Final Report   04/19/2021 03:24 pm ----------------------------------------------------------------------   MAU Course  Procedures  MDM -pt with single severe range pressure in MAU, HA, visual changes, otherwise all elevated pressures in MAU -called and spoke with Dr.  Ozan  who agrees with plan for admission -called and spoke with Dr. Benjie Karvonen who agrees with plan for admission and requests MAU provider to enter orders to betamethasone. Dr. Benjie Karvonen will admit patient to [facility] and will assess for need for magnesium after in-person evaluation. Dr Benjie Karvonen informed about NST. -EFM: initially reactive, later non-reactive with variables       -baseline: 140       -variability: moderate       -accels: present, 15x15       -decels: present, few variables       -TOCO: quiet -admit to [facility]  Orders Placed This Encounter  Procedures   Resp Panel by RT-PCR (Flu A&B, Covid) Nasopharyngeal Swab    Standing Status:   Standing    Number of Occurrences:   1    Order Specific Question:   Is this test for diagnosis or screening    Answer:   Screening    Order Specific Question:   Symptomatic for COVID-19 as defined by CDC    Answer:   No    Order Specific Question:   Hospitalized for COVID-19    Answer:   No    Order Specific Question:   Admitted to ICU for COVID-19    Answer:   No    Order Specific Question:   Previously tested for COVID-19    Answer:   Yes    Order Specific Question:   Resident in a congregate (group) care setting    Answer:   No    Order Specific Question:   Employed in healthcare setting    Answer:   No    Order Specific Question:   Pregnant    Answer:   Yes    Order Specific Question:   Has patient completed COVID vaccination(s) (2 doses of Pfizer/Moderna 1 dose of [employer])    Answer:   Yes    Order Specific Question:   Has patient completed COVID Booster / 3rd dose    Answer:   Yes   Urinalysis, Routine w reflex microscopic Urine, Clean Catch    Standing Status:   Standing    Number of Occurrences:   1   CBC with Differential/Platelet    Standing Status:   Standing    Number of Occurrences:   1   Comprehensive metabolic panel    Standing Status:   Standing    Number of Occurrences:   1   Protein / creatinine ratio,  urine    Standing Status:   Standing    Number of Occurrences:   1   Meds ordered this encounter  Medications   betamethasone acetate-betamethasone sodium phosphate (CELESTONE) injection 12 mg   Assessment and Plan   1. Severe pre-eclampsia in third trimester   2. [redacted] weeks gestation of pregnancy   3. NST (non-stress test) reactive   4. Variable fetal heart rate decelerations, antepartum    -admit to [facility] 05/10/2021, 4:56 PM

## 2021-05-10 NOTE — Progress Notes (Signed)
Dr. Annamaria Boots aware of elevated BP's.

## 2021-05-10 NOTE — Progress Notes (Signed)
BPs stable  ST reactive- change to FHT/NST for 1 hr every shift and often if BPs in severe range

## 2021-05-10 NOTE — Procedures (Signed)
Katherine Marshall 01-Dec-1985 [redacted]w[redacted]d Fetus A Non-Stress Test Interpretation for 05/10/21  Indication: IUGR  Fetal Heart Rate A Mode: External Baseline Rate (A): 135 bpm Variability: Moderate Accelerations: 15 x 15 Decelerations: Variable Multiple birth?: No  Uterine Activity Mode: Palpation, Toco Contraction Frequency (min): none Resting Tone Palpated: Relaxed Resting Time: Adequate  Interpretation (Fetal Testing) Nonstress Test Interpretation: Reactive Overall Impression: Reassuring for gestational age Comments: Dr. FAnnamaria Bootsreviewed tracing.

## 2021-05-10 NOTE — H&P (Addendum)
Katherine Marshall is a 35 y.o. female presenting for admission at 35.4 wks for preeclampsia with one severe range BP but having headache and vision blurriness. She was sent to MAU by MFM after noting high BPs and has h/o Preeclampsia with one pregnancy and gestational HTN with another pregnancy. Currently seeing MFM for early onset IUGR in 3rd trim and gets ANtesting wkly in our office.    She is [id number] at [redacted]w[redacted]d Patient reports her BPs have started to be elevated around one week ago. Patient reports 4/10 headache and seeing intermittent spots in her vision that started one week ago as well. Patient denies DFM. Patient reports history of preeclampsia requiring magnesium in previous pregnancy. Call from Dr. FAnnamaria Bootstoday in [place] baby received 10/10 on testing today. Pt denies N/V, epigastric pain, swelling in face and hands, sudden weight gain or chest pain and SOB.  + FMs. No UCs, No vag bleeding.   IUGR since 28 wk sono for h/o SGA and hypothyroid. Last growth today with MFM-- 4'12" at 5% and AC 21%, AFI 10 cm, Vx, BPP 8/8. Nl UAD, NST at MFM reactive   H/o 2 SVDs, both kids were small (5'5" and 6'7" ) then multiple 1st trim losses, one preterm loss at 20-21 wks and is taking wkly Makena injections since 16 wks  Also hypothyroidism, well controlled labs, stopped [facility], [facility]  AMA, NIPT low risk, AFP1 wnl  OB History     Gravida  9   Para  3   Term  2   Preterm  1   AB  5   Living  2      SAB  5   IAB  0   Ectopic  0   Multiple  0   Live Births  2          Past Medical History:  Diagnosis Date   Anxiety    GERD (gastroesophageal reflux disease)    Gestational thrombocytopenia without hemorrhage ([facility] 10/06/2016   Hypertension    PIH with last preg   Hypothyroidism    IBS (irritable bowel syndrome)    Kidney stones    Migraines    as a child   Pregnancy induced hypertension    Past Surgical History:  Procedure Laterality  Date   BASAL CELL CARCINOMA EXCISION     DENTAL SURGERY     Family History: family history includes Cancer in her father, paternal grandfather, and paternal grandmother; Diabetes in her maternal uncle; Heart attack in her maternal grandfather and maternal grandmother; Hypertension in her brother, father, maternal grandfather, maternal grandmother, mother, paternal grandfather, and paternal grandmother. Social History:  reports that she has never smoked. She has never used smokeless tobacco. She reports previous alcohol use of about 3.0 - 4.0 standard drinks of alcohol per week. She reports that she does not use drugs.     Maternal Diabetes: No Genetic Screening: Normal NIPT Maternal Ultrasounds/Referrals: IUGR from 28 wks. Started seeing MFM q 4 wks for growth.                                           intermittently Fetal right renal pelvis pyelectasis noted- last at 35.1 wks was 7.2 mm  intermittently slight enlarged Right lateral ventricle noted, last at 35.1 wks was 10.7 mm   Fetal Ultrasounds or other Referrals:  Referred to Materal Fetal Medicine  Maternal Substance Abuse:  No Significant Maternal Medications:  Meds include: Progesterone Syntroid Other:  Pantoprazole and Zofran  Significant Maternal Lab Results:  Group B Strep negative Other Comments:   new onset Preeclampsia at 35.4 wks   Review of Systems as in H&P History   Blood pressure (!) 150/108, pulse 94, temperature 98.5 F (36.9 C), temperature source Oral, resp. rate 16, last menstrual period 09/03/2020, SpO2 97 %, unknown if currently breastfeeding. Exam Physical Exam  Physical exam:  A&O x 3, no acute distress. Pleasant HEENT neg, no thyromegaly Lungs CTA bilat CV RRR,  S1S2 normal Abdo soft, non tender, non acute Extr no edema/ tenderness. DTR +2/+2  Pelvic closed, long cx  FHT   Toco none   Prenatal labs: ABO, Rh:  A+ Antibody:  Neg Rubella:  Imm RPR:   Neg x  2 HBsAg:   Neg HIV:   Neg GBS:   Neg  NIPT low risk  AFP1 normal  TSH was low at 0.3 in May'22, synthroid dropped to 137 mcg and nl in Jun'22 and Jul'22   CBC    Component Value Date/Time   WBC 10.1 05/10/2021 1614   RBC 3.91 05/10/2021 1614   HGB 11.2 (L) 05/10/2021 1614   HGB 14.0 06/22/2013 1028   HCT 34.6 (L) 05/10/2021 1614   PLT 161 05/10/2021 1614   MCV 88.5 05/10/2021 1614   MCH 28.6 05/10/2021 1614   MCHC 32.4 05/10/2021 1614   RDW 12.7 05/10/2021 1614   LYMPHSABS 2.4 05/10/2021 1614   MONOABS 0.6 05/10/2021 1614   EOSABS 0.1 05/10/2021 1614   BASOSABS 0.0 05/10/2021 1614   CMP     Component Value Date/Time   NA 133 (L) 05/10/2021 1614   K 4.1 05/10/2021 1614   CL 104 05/10/2021 1614   CO2 21 (L) 05/10/2021 1614   GLUCOSE 91 05/10/2021 1614   BUN 10 05/10/2021 1614   CREATININE 0.85 05/10/2021 1614   CALCIUM 8.8 (L) 05/10/2021 1614   PROT 6.1 (L) 05/10/2021 1614   ALBUMIN 2.5 (L) 05/10/2021 1614   AST 23 05/10/2021 1614   ALT 22 05/10/2021 1614   ALKPHOS 91 05/10/2021 1614   BILITOT 0.3 05/10/2021 1614   GFRNONAA >60 05/10/2021 1614   GFRAA >60 10/04/2016 1155     Assessment/Plan: 35 yo [id number], 35.4 wks with new onset preeclampsia and neural symptoms. One severe range BP and with neural symptoms, admission is warranted.  1) Preeclampsia, severe - BP now non-severe, nl labs (nl HELLP) Urine p/c 0.3 . Repeat labs tomorrow  Hold magnesium but if HA return or BP get back in severe range, plan HTN protocol and magnesium for seizure prophylaxis and move towards IOL after steroid completion. H/o GHTN and H/o Preeclampsia in prior pregnancies.  Low threshold for delivery for maternal worsening before steroids o/w IOL at 48 hrs  2) Prematurity BTMZ now at at 24 hrs EFW 4'12" today  3) IUGR since 28 wks with some improvement- today at 5% (4'12") and AC at 21%, BPP 10/10 and nl UAD at MFM. Vx per MFM sono. CEFM for 48 hrs, delivery for fetal indication  4)  Hypothyroid, continue Levothyroxine 137 mcg 5) AMA- nl NIPT 6) Fetal pyelectasis (right renal) but intermittently normal and Fetal right lateral ventricle dilation but intermittently normal. See PITT - Peds to  follow    [name] 05/10/2021, 5:30 PM

## 2021-05-10 NOTE — MAU Note (Signed)
...  Katherine Marshall is a 35 y.o. at 90w4dhere in MAU reporting: Last Saturday, the 16th, she began experiencing HA's and elevated BP's. She states her headaches have been relieved by 1000 mg of Tylenol but states her HA's will eventually return. She states she will randomly experience grey floaters in her eyes when resting and when driving. Denies RUQ pain, but endorses swelling of her hands and bilateral swelling of her calves. +FM. No VB or LOF. Endorses a current HA that she rates 4/10.   States she had an [place] NST, and BPP at MFM today. BPP 8/8.  BP: 133/113 P: 88 T: 98.5 R: 16 O2: 98%  Lab orders placed from triage: UA

## 2021-05-11 LAB — CBC
HCT: 32.3 % — ABNORMAL LOW (ref 36.0–46.0)
Hemoglobin: 10.8 g/dL — ABNORMAL LOW (ref 12.0–15.0)
MCH: 29.2 pg (ref 26.0–34.0)
MCHC: 33.4 g/dL (ref 30.0–36.0)
MCV: 87.3 fL (ref 80.0–100.0)
Platelets: 156 10*3/uL (ref 150–400)
RBC: 3.7 MIL/uL — ABNORMAL LOW (ref 3.87–5.11)
RDW: 12.8 % (ref 11.5–15.5)
WBC: 7.9 10*3/uL (ref 4.0–10.5)
nRBC: 0 % (ref 0.0–0.2)

## 2021-05-11 LAB — COMPREHENSIVE METABOLIC PANEL
ALT: 27 U/L (ref 0–44)
AST: 27 U/L (ref 15–41)
Albumin: 2.4 g/dL — ABNORMAL LOW (ref 3.5–5.0)
Alkaline Phosphatase: 87 U/L (ref 38–126)
Anion gap: 9 (ref 5–15)
BUN: 9 mg/dL (ref 6–20)
CO2: 19 mmol/L — ABNORMAL LOW (ref 22–32)
Calcium: 9 mg/dL (ref 8.9–10.3)
Chloride: 106 mmol/L (ref 98–111)
Creatinine, Ser: 0.8 mg/dL (ref 0.44–1.00)
GFR, Estimated: >60 mL/min (ref >60)
Glucose, Bld: 112 mg/dL — ABNORMAL HIGH (ref 70–99)
Potassium: 4.1 mmol/L (ref 3.5–5.1)
Sodium: 134 mmol/L — ABNORMAL LOW (ref 135–145)
Total Bilirubin: 0.4 mg/dL (ref 0.3–1.2)
Total Protein: 6.1 g/dL — ABNORMAL LOW (ref 6.5–8.1)

## 2021-05-11 LAB — RESP PANEL BY RT-PCR (FLU A&B, COVID) ARPGX2
Influenza A by PCR: NEGATIVE
Influenza B by PCR: NEGATIVE
SARS Coronavirus 2 by RT PCR: NEGATIVE

## 2021-05-11 MED ORDER — DOXYLAMINE SUCCINATE (SLEEP) 25 MG PO TABS
25.0000 mg | ORAL_TABLET | Freq: Every evening | ORAL | Status: DC | PRN
  Administered 2021-05-11: 25 mg via ORAL
  Filled 2021-05-11 (×2): qty 1

## 2021-05-11 NOTE — Progress Notes (Signed)
Covid swab recollected and sent to lab since 1st collection was never received by lab. Pt tolerated well.

## 2021-05-11 NOTE — Progress Notes (Signed)
HD #2 37 wks  Severe PEC, IUGR   Subjective: Good FMs no UCs/ pain. Slight HA again this morning. No blurred vision.   Objective: BP 139/85 (BP Location: Right Arm)   Pulse 99   Temp 98 F (36.7 C) (Oral)   Resp 16   Ht '5\' 1"'$  (1.549 m)   Wt 103.9 kg   LMP 09/03/2020   SpO2 99%   BMI 43.27 kg/m    Vital signs in last 24 hours: Temp:  [97.9 F (36.6 C)-98.5 F (36.9 C)] 98 F (36.7 C) (07/23 0739) Pulse Rate:  [78-100] 99 (07/23 0739) Resp:  [16-17] 16 (07/23 0739) BP: (120-159)/(74-113) 139/85 (07/23 0739) SpO2:  [97 %-100 %] 99 % (07/23 0739) Weight:  [103.9 kg] 103.9 kg (07/23 0500) Weight change:  Last BM Date: 05/10/21  Intake/Output from previous day: 07/22 0701 - 07/23 0700 In: 480 [P.O.:480] Out: 750 [Urine:750] Intake/Output this shift: Total I/O In: 360 [P.O.:360] Out: 650 [Urine:650]  General appearance: alert and cooperative Head: Normocephalic, without obvious abnormality Resp: clear to auscultation bilaterally Cardio: regular rate and rhythm, S1, S2 normal, no murmur, click, rub or gallop GI: soft, gravid uterus  Extremities: Homans sign is negative, no sign of DVT  NST 140s mod variability + accels no decels  NST reactive  Toco none   Lab Results: Recent Labs    05/10/21 1614 05/11/21 0513  WBC 10.1 7.9  HGB 11.2* 10.8*  HCT 34.6* 32.3*  PLT 161 156   BMET Recent Labs    05/10/21 1614 05/11/21 0513  NA 133* 134*  K 4.1 4.1  CL 104 106  CO2 21* 19*  GLUCOSE 91 112*  BUN 10 9  CREATININE 0.85 0.80  CALCIUM 8.8* 9.0    Studies/Results: 7/22 MFM--BPP 8/8 and NST reactive UAD nl, EFW 4'12"    Medications: I have reviewed the patient's current medications. Scheduled:  betamethasone acetate-betamethasone sodium phosphate  12 mg Intramuscular Q24 Hr x 2   docusate sodium  100 mg Oral Daily   levothyroxine  137 mcg Oral Q0600   pantoprazole  20 mg Oral Daily   prenatal multivitamin  1 tablet Oral Q1200      Assessment/Plan: HD #2, 37 wks, Severe PEC, IUGR. IOL after steroid coverage, plan on 7/25 AM with pitocin unless new concerns. PEC- stable labs and now non-severe range BPs, but flight HA coming back  IUGR- NICU at birth, EFW 4'12" cont NST q shift and CEFM if BP in severe range again Hypothyroid - contin Synthroid AMA Obesity  Fetal pyelectasis and fetal lateral ventriculomegaly- f/up post-natal    LOS: 1 day   Katherine Marshall 05/11/2021, 11:03 AM  Patient ID: Katherine Marshall, Katherine Marshall   DOB: [birth year], 35 y.o.   MRN: [id number]

## 2021-05-12 NOTE — Progress Notes (Signed)
HD # 3  35.6 wks  Severe PEC IUGR   Subjective: Good FMs no UCs/ pain. Slight HA again this morning, reports not feeling well. Last BM was on 7/20. Occ blurred vision but BP was normal then .   Objective: BP (!) 143/82 (BP Location: Right Arm)   Pulse 90   Temp 97.9 F (36.6 C) (Oral)   Resp 18   Ht '5\' 1"'$  (1.549 m)   Wt 102.6 kg   LMP 09/03/2020   SpO2 99%   BMI 42.75 kg/m   Patient Vitals for the past 24 hrs:  BP Temp Temp src Pulse Resp SpO2 Weight  05/12/21 1249 (!) 143/82 -- -- 90 -- -- --  05/12/21 1113 (!) 156/98 97.9 F (36.6 C) Oral 79 18 99 % --  05/12/21 0813 132/83 97.8 F (36.6 C) Axillary 87 16 99 % --  05/12/21 0432 123/68 98.3 F (36.8 C) Oral 71 -- 100 % 102.6 kg  05/11/21 2322 116/66 98.2 F (36.8 C) Oral 74 17 100 % --  05/11/21 2008 (!) 136/91 98.7 F (37.1 C) Oral 82 18 100 % --  05/11/21 1606 139/75 98 F (36.7 C) Oral 85 17 99 % --     Intake/Output from previous day: 07/23 0701 - 07/24 0700 In: 720 [P.O.:720] Out: 3750 [Urine:3750] Intake/Output this shift: Total I/O In: 660 [P.O.:660] Out: 600 [Urine:600]  General appearance: alert and cooperative Head: Normocephalic, without obvious abnormality Resp: clear to auscultation bilaterally Cardio: regular rate and rhythm, S1, S2 normal, no murmur, click, rub or gallop GI: soft, gravid uterus  Extremities: Homans sign is negative, no sign of DVT  NST 140s mod variability + accels no decels  NST reactive  Toco none   Lab Results: Recent Labs    05/10/21 1614 05/11/21 0513  WBC 10.1 7.9  HGB 11.2* 10.8*  HCT 34.6* 32.3*  PLT 161 156    BMET Recent Labs    05/10/21 1614 05/11/21 0513  NA 133* 134*  K 4.1 4.1  CL 104 106  CO2 21* 19*  GLUCOSE 91 112*  BUN 10 9  CREATININE 0.85 0.80  CALCIUM 8.8* 9.0     Studies/Results: 7/22 MFM--BPP 8/8 and NST reactive UAD nl, EFW 4'12"    Medications: I have reviewed the patient's current medications. Scheduled:  docusate sodium   100 mg Oral Daily   levothyroxine  137 mcg Oral Q0600   pantoprazole  20 mg Oral Daily   prenatal multivitamin  1 tablet Oral Q1200     Assessment/Plan: HD # 3, 37 wks, Severe PEC, IUGR. IOL after steroid coverage, plan on 7/25 AM with pitocin unless new concerns. PEC- stable labs and now non-severe range BPs, but flight HA coming back  IUGR- NICU at birth, EFW 4'12" cont NST q shift and CEFM if BP in severe range again Hypothyroid - contin Synthroid AMA Obesity  Fetal pyelectasis and fetal lateral ventriculomegaly- f/up post-natal    Katherine Marshall 05/12/2021, 3:32 PM  Patient ID: Katherine Marshall, female   DOB: 1985/12/10, 35 y.o.   MRN: [id number]

## 2021-05-13 ENCOUNTER — Inpatient Hospital Stay (HOSPITAL_COMMUNITY): Payer: Managed Care, Other (non HMO) | Admitting: Anesthesiology

## 2021-05-13 ENCOUNTER — Encounter (HOSPITAL_COMMUNITY): Payer: Self-pay | Admitting: Obstetrics & Gynecology

## 2021-05-13 LAB — CBC
HCT: 32.5 % — ABNORMAL LOW (ref 36.0–46.0)
HCT: 33.7 % — ABNORMAL LOW (ref 36.0–46.0)
Hemoglobin: 10.4 g/dL — ABNORMAL LOW (ref 12.0–15.0)
Hemoglobin: 10.8 g/dL — ABNORMAL LOW (ref 12.0–15.0)
MCH: 28.3 pg (ref 26.0–34.0)
MCH: 28.5 pg (ref 26.0–34.0)
MCHC: 32 g/dL (ref 30.0–36.0)
MCHC: 32 g/dL (ref 30.0–36.0)
MCV: 88.3 fL (ref 80.0–100.0)
MCV: 88.9 fL (ref 80.0–100.0)
Platelets: 150 10*3/uL (ref 150–400)
Platelets: 151 10*3/uL (ref 150–400)
RBC: 3.68 MIL/uL — ABNORMAL LOW (ref 3.87–5.11)
RBC: 3.79 MIL/uL — ABNORMAL LOW (ref 3.87–5.11)
RDW: 12.9 % (ref 11.5–15.5)
RDW: 12.8 % (ref 11.5–15.5)
WBC: 10.8 10*3/uL — ABNORMAL HIGH (ref 4.0–10.5)
WBC: 11.5 10*3/uL — ABNORMAL HIGH (ref 4.0–10.5)
nRBC: 0.5 % — ABNORMAL HIGH (ref 0.0–0.2)
nRBC: 0.4 % — ABNORMAL HIGH (ref 0.0–0.2)

## 2021-05-13 LAB — COMPREHENSIVE METABOLIC PANEL
ALT: 36 U/L (ref 0–44)
AST: 27 U/L (ref 15–41)
Albumin: 2.4 g/dL — ABNORMAL LOW (ref 3.5–5.0)
Alkaline Phosphatase: 80 U/L (ref 38–126)
Anion gap: 10 (ref 5–15)
BUN: 14 mg/dL (ref 6–20)
CO2: 19 mmol/L — ABNORMAL LOW (ref 22–32)
Calcium: 8.7 mg/dL — ABNORMAL LOW (ref 8.9–10.3)
Chloride: 107 mmol/L (ref 98–111)
Creatinine, Ser: 0.81 mg/dL (ref 0.44–1.00)
GFR, Estimated: >60 mL/min (ref >60)
Glucose, Bld: 85 mg/dL (ref 70–99)
Potassium: 4 mmol/L (ref 3.5–5.1)
Sodium: 136 mmol/L (ref 135–145)
Total Bilirubin: 0.4 mg/dL (ref 0.3–1.2)
Total Protein: 5.9 g/dL — ABNORMAL LOW (ref 6.5–8.1)

## 2021-05-13 LAB — TYPE AND SCREEN
ABO/RH(D): A POS
Antibody Screen: NEGATIVE

## 2021-05-13 MED ORDER — TERBUTALINE SULFATE 1 MG/ML IJ SOLN: 0.2500 mg | Freq: Once | INTRAMUSCULAR | Status: DC | PRN

## 2021-05-13 MED ORDER — PHENYLEPHRINE 40 MCG/ML (10ML) SYRINGE FOR IV PUSH (FOR BLOOD PRESSURE SUPPORT): 80.0000 ug | PREFILLED_SYRINGE | INTRAVENOUS | Status: DC | PRN

## 2021-05-13 MED ORDER — SOD CITRATE-CITRIC ACID 500-334 MG/5ML PO SOLN
30.0000 mL | ORAL | Status: DC | PRN
Start: 2021-05-13 — End: 2021-05-15

## 2021-05-13 MED ORDER — OXYCODONE-ACETAMINOPHEN 5-325 MG PO TABS: 1.0000 | ORAL_TABLET | ORAL | Status: DC | PRN

## 2021-05-13 MED ORDER — LACTATED RINGERS IV SOLN
INTRAVENOUS | Status: DC
Start: 2021-05-13 — End: 2021-05-15

## 2021-05-13 MED ORDER — LIDOCAINE HCL (PF) 1 % IJ SOLN
30.0000 mL | INTRAMUSCULAR | Status: DC | PRN
Start: 2021-05-13 — End: 2021-05-15

## 2021-05-13 MED ORDER — ONDANSETRON HCL 4 MG/2ML IJ SOLN
4.0000 mg | Freq: Four times a day (QID) | INTRAMUSCULAR | Status: DC | PRN
Start: 2021-05-13 — End: 2021-05-15

## 2021-05-13 MED ORDER — LIDOCAINE HCL (PF) 1 % IJ SOLN
INTRAMUSCULAR | Status: DC | PRN
  Administered 2021-05-13: 10 mL via EPIDURAL

## 2021-05-13 MED ORDER — OXYTOCIN BOLUS FROM INFUSION
333.0000 mL | Freq: Once | INTRAVENOUS | Status: AC
  Administered 2021-05-14: 333 mL via INTRAVENOUS

## 2021-05-13 MED ORDER — MISOPROSTOL 50MCG HALF TABLET: 50.0000 ug | ORAL_TABLET | ORAL | Status: DC | PRN

## 2021-05-13 MED ORDER — OXYTOCIN-SODIUM CHLORIDE 30-0.9 UT/500ML-% IV SOLN
1.0000 m[IU]/min | INTRAVENOUS | Status: DC
  Administered 2021-05-13: 2 m[IU]/min via INTRAVENOUS

## 2021-05-13 MED ORDER — MISOPROSTOL 50MCG HALF TABLET
50.0000 ug | ORAL_TABLET | ORAL | Status: DC
  Administered 2021-05-13 (×2): 50 ug via BUCCAL
  Filled 2021-05-13 (×2): qty 1

## 2021-05-13 MED ORDER — DIPHENHYDRAMINE HCL 50 MG/ML IJ SOLN: 12.5000 mg | INTRAMUSCULAR | Status: DC | PRN

## 2021-05-13 MED ORDER — ACETAMINOPHEN 325 MG PO TABS: 650.0000 mg | ORAL_TABLET | ORAL | Status: DC | PRN

## 2021-05-13 MED ORDER — LACTATED RINGERS IV SOLN: 500.0000 mL | Freq: Once | INTRAVENOUS | Status: DC

## 2021-05-13 MED ORDER — OXYTOCIN-SODIUM CHLORIDE 30-0.9 UT/500ML-% IV SOLN
2.5000 [IU]/h | INTRAVENOUS | Status: DC
  Administered 2021-05-14: 2.5 [IU]/h via INTRAVENOUS
  Filled 2021-05-13 (×2): qty 500

## 2021-05-13 MED ORDER — FENTANYL CITRATE (PF) 100 MCG/2ML IJ SOLN: 50.0000 ug | INTRAMUSCULAR | Status: DC | PRN

## 2021-05-13 MED ORDER — LACTATED RINGERS IV SOLN
500.0000 mL | INTRAVENOUS | Status: DC | PRN
Start: 2021-05-13 — End: 2021-05-15

## 2021-05-13 MED ORDER — EPHEDRINE 5 MG/ML INJ: 10.0000 mg | INTRAVENOUS | Status: DC | PRN

## 2021-05-13 MED ORDER — FENTANYL-BUPIVACAINE-NACL 0.5-0.125-0.9 MG/250ML-% EP SOLN
12.0000 mL/h | EPIDURAL | Status: DC | PRN
Start: 2021-05-13 — End: 2021-05-14
  Administered 2021-05-13: 12 mL/h via EPIDURAL
  Filled 2021-05-13: qty 250

## 2021-05-13 NOTE — Anesthesia Preprocedure Evaluation (Signed)
Anesthesia Evaluation  Patient identified by MRN, date of birth, ID band Patient awake    Reviewed: Allergy & Precautions, H&P , NPO status , Patient's Chart, lab work & pertinent test results  History of Anesthesia Complications Negative for: history of anesthetic complications  Airway Mallampati: II  TM Distance: >3 FB     Dental   Pulmonary neg pulmonary ROS,    Pulmonary exam normal        Cardiovascular hypertension, negative cardio ROS   Rhythm:regular Rate:Normal     Neuro/Psych negative neurological ROS  negative psych ROS   GI/Hepatic Neg liver ROS, GERD  ,  Endo/Other  Hypothyroidism Morbid obesity  Renal/GU negative Renal ROS     Musculoskeletal   Abdominal   Peds  Hematology negative hematology ROS (+)   Anesthesia Other Findings   Reproductive/Obstetrics (+) Pregnancy                             Anesthesia Physical Anesthesia Plan  ASA: 3  Anesthesia Plan: Epidural   Post-op Pain Management:    Induction:   PONV Risk Score and Plan:   Airway Management Planned:   Additional Equipment:   Intra-op Plan:   Post-operative Plan:   Informed Consent: I have reviewed the patients History and Physical, chart, labs and discussed the procedure including the risks, benefits and alternatives for the proposed anesthesia with the patient or authorized representative who has indicated his/her understanding and acceptance.       Plan Discussed with:   Anesthesia Plan Comments:         Anesthesia Quick Evaluation

## 2021-05-13 NOTE — Progress Notes (Signed)
  Katherine Marshall is a 35 y.o. [id number] at 9w0dby LMP admitted for IOL for Severe PEC, known IUGR. She is s/p BMZ on 7/22 and 7/23.  S/p Cytotec buccal x 2 doses. Cervical balloon expelled, pt is s/p early epidural due to fear of pain from contractions. Needs AROM.   Subjective: Has mild cramps. Denies HA, visual changes, RUQ/epigastric pain.   Objective: Vitals:   05/13/21 2156 05/13/21 2157 05/13/21 2158 05/13/21 2202  BP:  (!) 164/107 (!) 154/86 (!) 146/88  Pulse:  60 62 69  Resp:   16   Temp:      TempSrc:      SpO2: 99%     Weight:      Height:        I/O last 3 completed shifts: In: 2160 [P.O.:2160] Out: 2600 [Urine:2600] No intake/output data recorded.   FHT:  FHR: 140-145 bpm, variability: moderate,  accelerations:  Present,  decelerations:  Absent UC:   irregular SVE:  4/50%/ -3/ Vx. Controlled AROM, clear fluid,   Labs:   Recent Labs    05/13/21 1215 05/13/21 2021  WBC 10.8* 11.5*  HGB 10.4* 10.8*  HCT 32.5* 33.7*  PLT 150 151     Assessment / Plan: [id number].o. [id number] of labor due to preeclampsia with intermittent severe range BPs and HAs. Known IUGR,   Labor: Progressing normally, early labor, S/p AROM, will reassess in 2 hrs if pitocin needed  Preeclampsia:  no severe range BPs today, no signs or symptoms of toxicity and labs stable, deferriing magnesium now  Fetal Wellbeing:  Category I Pain Control:  S/p  epidural  I/D:   GBS neg per office records  Anticipated MOD:  NSVD  VaElveria RoyalsMD 05/13/2021, 10:27 PM

## 2021-05-13 NOTE — Progress Notes (Signed)
Very mild h/a this, prior h/a resolved, this is minimal  No vision changes, no ruq pain +FM, no vb/lof/ctx  Patient Vitals for the past 24 hrs:  BP Temp Temp src Pulse Resp SpO2 Weight  05/13/21 0750 137/81 98.2 F (36.8 C) Oral 83 18 99 % --  05/13/21 0607 -- -- -- -- -- -- 103 kg  05/13/21 0411 135/83 98.2 F (36.8 C) Oral 67 17 99 % --  05/12/21 2330 126/80 98.3 F (36.8 C) Oral 73 16 99 % --  05/12/21 1919 133/74 98.5 F (36.9 C) Oral (!) 113 17 99 % --  05/12/21 1539 139/89 97.7 F (36.5 C) Oral 85 18 99 % --  05/12/21 1249 (!) 143/82 -- -- 90 -- -- --   A&ox3 Nml respirations Abd: soft, nt , ngd; gravid LE: tr edema, nt bilat  FHT: 130s, nml variability, +accles, no decels TOCO: no ctx  7/22 MFM--BPP 8/8 and NST reactive UAD nl, EFW 4'12"  Assessment/Plan: HD # 4, 36 wks, Severe PEC, IUGR.  S/p bmz x2, 7/22,7/23, plan IOL today; l/d called and informed of pt, will be called up likely this am PEC- stable labs and bps nml, low mild range, follow closely, mild h/a, ok for tylenol; hold magnesium sulfate d/t normal bps IUGR- NICU at birth, EFW 4'12" cont NST q shift  Hypothyroid - contin Synthroid AMA Obesity Fetal pyelectasis and fetal lateral ventriculomegaly- f/up post-natal  GBS negative - done in office Fetal status - reactive and reassuring

## 2021-05-13 NOTE — Progress Notes (Addendum)
  Katherine Marshall is a 35 y.o. [id number] at 68w0dby LMP admitted for IOL for Severe PEC, known IUGR. She is s/p BMZ on 7/22 and 7/23. She has received 2 doses of buccal Cytotec 592m with last dose at 1606. Asked by Dr. MoBenjie Karvoneno assess for intracervical balloon placement. Discussed r/b/mechanism of action/procedure of intracervical balloon and patient gave verbal consent. Eagerly anticipating baby girl, "Katherine Marshall"  Subjective: Reports some contractions, but has not felt any recently. Denies HA, visual changes, RUQ/epigastric pain.  Objective: Vitals:   05/13/21 1503 05/13/21 1618 05/13/21 1744 05/13/21 1853  BP: 114/73 (!) 142/88 126/82 (!) 149/106  Pulse: 74 71 67 67  Resp:      Temp:      TempSrc:      SpO2:      Weight:      Height:        I/O last 3 completed shifts: In: 2160 [P.O.:2160] Out: 4200 [Urine:4200] No intake/output data recorded.   FHT:  FHR: 140-145 bpm, variability: moderate,  accelerations:  Present,  decelerations:  Absent UC:   irregular SVE:   Dilation: Fingertip external os/ closed internal os/ thick by Katherine Marshall, CNM Sterile speculum placed in vagina. With good visualization of cervix, ring forceps used to insert intracervical balloon through cervical os and inflated with 60cc. I confirmed placement with digital exam, and traction was applied to leg and secured with tape. Pt. Tolerated procedure well. No complications.   Labs:   Recent  Labs    05/11/21 0513 05/13/21 1215  WBC 7.9 10.8*  HGB 10.8* 10.4*  HCT 32.3* 32.5*  PLT 156 150    Assessment / Plan: [id number]. [id number] of labor due to preeclampsia and IUGR,   Labor: Progressing normally Preeclampsia:  no severe range BPs today, no signs or symptoms of toxicity and labs stable Fetal Wellbeing:  Category I Pain Control:  Labor support without medications, planning epidural in active labor I/D:   GBS neg per office records Anticipated MOD:  NSVD  Dr. ModBenjie Karvonendated on patient status and will resume care  Katherine CheneyNM, MSN 05/13/2021, 6:55 PM

## 2021-05-13 NOTE — Anesthesia Procedure Notes (Signed)
Epidural Patient location during procedure: OB Start time: 05/13/2021 9:11 PM End time: 05/13/2021 9:23 PM  Staffing Anesthesiologist: Lidia Collum, MD Performed: anesthesiologist   Preanesthetic Checklist Completed: patient identified, IV checked, risks and benefits discussed, monitors and equipment checked, pre-op evaluation and timeout performed  Epidural Patient position: sitting Prep: DuraPrep Patient monitoring: heart rate, continuous pulse ox and blood pressure Approach: midline Location: L3-L4 Injection technique: LOR air  Needle:  Needle type: Tuohy  Needle gauge: 17 G Needle length: 9 cm Needle insertion depth: 7 cm Catheter type: closed end flexible Catheter size: 19 Gauge Catheter at skin depth: 12 cm Test dose: negative  Assessment Events: blood not aspirated, injection not painful, no injection resistance, no paresthesia and negative IV test  Additional Notes Reason for block:procedure for pain

## 2021-05-14 ENCOUNTER — Encounter (HOSPITAL_COMMUNITY): Payer: Self-pay | Admitting: Obstetrics & Gynecology

## 2021-05-14 DIAGNOSIS — F419 Anxiety disorder, unspecified: Secondary | ICD-10-CM | POA: Diagnosis present

## 2021-05-14 DIAGNOSIS — O1414 Severe pre-eclampsia complicating childbirth: Secondary | ICD-10-CM | POA: Diagnosis present

## 2021-05-14 DIAGNOSIS — E039 Hypothyroidism, unspecified: Secondary | ICD-10-CM | POA: Diagnosis present

## 2021-05-14 LAB — COMPREHENSIVE METABOLIC PANEL
ALT: 35 U/L (ref 0–44)
AST: 27 U/L (ref 15–41)
Albumin: 2.2 g/dL — ABNORMAL LOW (ref 3.5–5.0)
Alkaline Phosphatase: 77 U/L (ref 38–126)
Anion gap: 7 (ref 5–15)
BUN: 10 mg/dL (ref 6–20)
CO2: 22 mmol/L (ref 22–32)
Calcium: 7.7 mg/dL — ABNORMAL LOW (ref 8.9–10.3)
Chloride: 105 mmol/L (ref 98–111)
Creatinine, Ser: 0.76 mg/dL (ref 0.44–1.00)
GFR, Estimated: >60 mL/min (ref >60)
Glucose, Bld: 94 mg/dL (ref 70–99)
Potassium: 3.8 mmol/L (ref 3.5–5.1)
Sodium: 134 mmol/L — ABNORMAL LOW (ref 135–145)
Total Bilirubin: 0.2 mg/dL — ABNORMAL LOW (ref 0.3–1.2)
Total Protein: 5.3 g/dL — ABNORMAL LOW (ref 6.5–8.1)

## 2021-05-14 LAB — CBC
HCT: 34.6 % — ABNORMAL LOW (ref 36.0–46.0)
HCT: 33 % — ABNORMAL LOW (ref 36.0–46.0)
Hemoglobin: 11 g/dL — ABNORMAL LOW (ref 12.0–15.0)
Hemoglobin: 11 g/dL — ABNORMAL LOW (ref 12.0–15.0)
MCH: 28.4 pg (ref 26.0–34.0)
MCH: 29.3 pg (ref 26.0–34.0)
MCHC: 31.8 g/dL (ref 30.0–36.0)
MCHC: 33.3 g/dL (ref 30.0–36.0)
MCV: 89.4 fL (ref 80.0–100.0)
MCV: 87.8 fL (ref 80.0–100.0)
Platelets: 150 10*3/uL (ref 150–400)
Platelets: 138 10*3/uL — ABNORMAL LOW (ref 150–400)
RBC: 3.87 MIL/uL (ref 3.87–5.11)
RBC: 3.76 MIL/uL — ABNORMAL LOW (ref 3.87–5.11)
RDW: 12.9 % (ref 11.5–15.5)
RDW: 12.8 % (ref 11.5–15.5)
WBC: 16.6 10*3/uL — ABNORMAL HIGH (ref 4.0–10.5)
WBC: 15.7 10*3/uL — ABNORMAL HIGH (ref 4.0–10.5)
nRBC: 0.1 % (ref 0.0–0.2)
nRBC: 0 % (ref 0.0–0.2)

## 2021-05-14 LAB — MAGNESIUM: Magnesium: 5.4 mg/dL — ABNORMAL HIGH (ref 1.7–2.4)

## 2021-05-14 LAB — RPR: RPR Ser Ql: NONREACTIVE

## 2021-05-14 MED ORDER — COCONUT OIL OIL
1.0000 "application " | TOPICAL_OIL | Status: DC | PRN
  Administered 2021-05-14: 1 via TOPICAL

## 2021-05-14 MED ORDER — WITCH HAZEL-GLYCERIN EX PADS: 1.0000 "application " | MEDICATED_PAD | CUTANEOUS | Status: DC | PRN

## 2021-05-14 MED ORDER — DIPHENHYDRAMINE HCL 25 MG PO CAPS: 25.0000 mg | ORAL_CAPSULE | Freq: Four times a day (QID) | ORAL | Status: DC | PRN

## 2021-05-14 MED ORDER — ONDANSETRON HCL 4 MG PO TABS: 4.0000 mg | ORAL_TABLET | ORAL | Status: DC | PRN

## 2021-05-14 MED ORDER — MAGNESIUM SULFATE 40 GM/1000ML IV SOLN
2.0000 g/h | INTRAVENOUS | Status: AC
  Administered 2021-05-14 (×2): 2 g/h via INTRAVENOUS
  Filled 2021-05-14 (×2): qty 1000

## 2021-05-14 MED ORDER — ONDANSETRON HCL 4 MG/2ML IJ SOLN: 4.0000 mg | INTRAMUSCULAR | Status: DC | PRN

## 2021-05-14 MED ORDER — BENZOCAINE-MENTHOL 20-0.5 % EX AERO
1.0000 "application " | INHALATION_SPRAY | CUTANEOUS | Status: DC | PRN
  Administered 2021-05-14: 1 via TOPICAL
  Filled 2021-05-14: qty 56

## 2021-05-14 MED ORDER — IBUPROFEN 600 MG PO TABS
600.0000 mg | ORAL_TABLET | Freq: Four times a day (QID) | ORAL | Status: DC
  Administered 2021-05-14 – 2021-05-15 (×5): 600 mg via ORAL
  Filled 2021-05-14 (×6): qty 1

## 2021-05-14 MED ORDER — MAGNESIUM SULFATE BOLUS VIA INFUSION
4.0000 g | Freq: Once | INTRAVENOUS | Status: AC
  Administered 2021-05-14: 4 g via INTRAVENOUS
  Filled 2021-05-14: qty 1000

## 2021-05-14 MED ORDER — LABETALOL HCL 5 MG/ML IV SOLN: 40.0000 mg | INTRAVENOUS | Status: DC | PRN

## 2021-05-14 MED ORDER — LABETALOL HCL 5 MG/ML IV SOLN: 80.0000 mg | INTRAVENOUS | Status: DC | PRN

## 2021-05-14 MED ORDER — LEVOTHYROXINE SODIUM 137 MCG PO TABS: 137.0000 ug | ORAL_TABLET | Freq: Every day | ORAL | Status: DC

## 2021-05-14 MED ORDER — DIBUCAINE (PERIANAL) 1 % EX OINT: 1.0000 "application " | TOPICAL_OINTMENT | CUTANEOUS | Status: DC | PRN

## 2021-05-14 MED ORDER — LABETALOL HCL 5 MG/ML IV SOLN: 20.0000 mg | INTRAVENOUS | Status: DC | PRN

## 2021-05-14 MED ORDER — SIMETHICONE 80 MG PO CHEW: 80.0000 mg | CHEWABLE_TABLET | ORAL | Status: DC | PRN

## 2021-05-14 MED ORDER — ZOLPIDEM TARTRATE 5 MG PO TABS: 5.0000 mg | ORAL_TABLET | Freq: Every evening | ORAL | Status: DC | PRN

## 2021-05-14 MED ORDER — TETANUS-DIPHTH-ACELL PERTUSSIS 5-2.5-18.5 LF-MCG/0.5 IM SUSY: 0.5000 mL | PREFILLED_SYRINGE | Freq: Once | INTRAMUSCULAR | Status: DC

## 2021-05-14 MED ORDER — SENNOSIDES-DOCUSATE SODIUM 8.6-50 MG PO TABS
2.0000 | ORAL_TABLET | Freq: Every day | ORAL | Status: DC
  Administered 2021-05-15: 2 via ORAL
  Filled 2021-05-14: qty 2

## 2021-05-14 MED ORDER — LACTATED RINGERS IV SOLN: INTRAVENOUS | Status: DC

## 2021-05-14 MED ORDER — ACETAMINOPHEN 325 MG PO TABS
650.0000 mg | ORAL_TABLET | ORAL | Status: DC | PRN
  Administered 2021-05-14: 650 mg via ORAL
  Filled 2021-05-14: qty 2

## 2021-05-14 MED ORDER — PANTOPRAZOLE SODIUM 20 MG PO TBEC: 20.0000 mg | DELAYED_RELEASE_TABLET | Freq: Every day | ORAL | Status: DC

## 2021-05-14 MED ORDER — NIFEDIPINE ER OSMOTIC RELEASE 30 MG PO TB24
30.0000 mg | ORAL_TABLET | Freq: Every day | ORAL | Status: DC
  Administered 2021-05-14: 30 mg via ORAL
  Filled 2021-05-14: qty 1

## 2021-05-14 MED ORDER — PRENATAL MULTIVITAMIN CH
1.0000 | ORAL_TABLET | Freq: Every day | ORAL | Status: DC
  Administered 2021-05-14 – 2021-05-15 (×2): 1 via ORAL
  Filled 2021-05-14 (×2): qty 1

## 2021-05-14 MED ORDER — LABETALOL HCL 100 MG PO TABS
100.0000 mg | ORAL_TABLET | Freq: Two times a day (BID) | ORAL | Status: DC
Start: 2021-05-14 — End: 2021-05-15
  Administered 2021-05-14 – 2021-05-15 (×3): 100 mg via ORAL
  Filled 2021-05-14 (×3): qty 1

## 2021-05-14 MED ORDER — HYDRALAZINE HCL 20 MG/ML IJ SOLN: 10.0000 mg | INTRAMUSCULAR | Status: DC | PRN

## 2021-05-14 NOTE — Progress Notes (Signed)
MOB was referred for history of depression/anxiety. * Referral screened out by Clinical Social Worker because none of the following criteria appear to apply: ~ History of anxiety/depression during this pregnancy, or of post-partum depression. ~ Diagnosis of anxiety and/or depression within last 3 years OR * MOB's symptoms currently being treated with medication and/or therapy. Please contact the Clinical Social Worker if needs arise, or if MOB requests.  Ailyne Pawley Boyd-Gilyard, MSW, LCSW Clinical Social Work [phone] 

## 2021-05-14 NOTE — Lactation Note (Addendum)
This note was copied from a baby's chart. Lactation Consultation Note  Patient Name: Katherine Marshall [id number] Date: 05/14/2021 Reason for consult: Initial assessment;Late-preterm 34-36.6wks;Infant < 6lbs;Maternal endocrine disorder Age:35 years  LC in to visit with P3 Mom of LPTI weighing 4 lbs. 7.3 oz at birth.  Baby's CBGs have been 36/50/39.  Baby's temp at 1122 was 97.5 ax. Talked with RN about a repeat CBG needed, order placed.  Baby asleep swaddled in crib.  Mom is on MgSO4 currently and states she is dizzy and tired.  Asked father about STS and he whipped off his tee shirt and LC assisted him with chest to chest and blanket over baby and two hats.  Parents aware of importance of baby maintaining her temperature and blood sugars.  Baby if being bottle fed (with Nfant Slow Flow nipple) 2-5 ml.    Mom interested in pumping to support her milk supply.  Reviewed breast massage and hand expression and there was an easy flow of colostrum.    DEBP set up and assisted Mom with first double pumping on initiation setting.  Blanket placed over breasts to help Mom relax and not stress about volume.    Plan- 1- Keep baby STS as much as possible 2-Watch baby for cues, or awaken if sleeping if 3 hrs since last feeding 3-Supplement baby with 5-10 ml EBM+/22 cal formula increasing volume as baby tolerates.  LPTI guidelines provided, increase volume daily.  If baby unable to consume minimal amts on guideline, call SLP 4- Pump both breasts on initiation setting, adding breast massage and hand expression to collect as much colostrum to feed baby. 5- ask for help prn  Mom has a hand's free Elvie Double pump at home.  Talked to parents about renting a hospital grade DEBP from gift shop for first month to support a full milk supply.  Elvie pump will be sufficient for returning to work and pumping.  Lactation brochure provided.    Maternal Data Has patient been taught Hand Expression?: Yes Does the  patient have breastfeeding experience prior to this delivery?: Yes How long did the patient breastfeed?: 2 months  Feeding Nipple Type: Nfant Slow Flow (purple)  Lactation Tools Discussed/Used Tools: Pump;Flanges Flange Size: 24 Breast pump type: Double-Electric Breast Pump Pump Education: Setup, frequency, and cleaning;Milk Storage Reason for Pumping: Support full milk supply/LPTI/<5 lbs Pumping frequency: Mom to pump every 2- 3hrs when baby is being fed supplement  Discharge Pump: Personal;Refer for rental (Mom has an Elvie hand's free pump at home)  Consult Status Consult Status: Follow-up Date: 05/15/21 Follow-up type: [place] 05/14/2021, 12:16 PM

## 2021-05-14 NOTE — Progress Notes (Addendum)
Needs transfer to [place] for 24 hr magnesium for severe PEC. Will start Labetalol '100mg'$  bid, reassess BPs PP and add Procardia '30mg'$  XL if needed due to some severe range BPs during this hospital stay and prior h/o PEC needing readmit after 1st kid

## 2021-05-14 NOTE — Progress Notes (Signed)
PPD # 0 S/P NSVD Interval Note  Live born female  Birth Weight: 4 lb 7.3 oz (2021 g) APGAR: 9, 9  Newborn Delivery   Birth date/time: 05/14/2021 00:59:00 Delivery type: Vaginal, Spontaneous     Baby name: [name] Delivering provider: MODY, VAISHALI  Episiotomy:None   Lacerations:1st degree   Feeding: breast and bottle  Pain control at delivery: Epidural   S:  Reports feeling very woozy and out of it. Hesitant to hold baby for fear of dropping her due to mental status. Pain is minimal. Husband at the bedside.              Tolerating PO/No nausea or vomiting             Bleeding is light             Pain controlled with acetaminophen and ibuprofen (OTC)             Up ad lib/ambulatory/voiding without difficulties   O:  A & O x 3, in no apparent distress              VS:  Vitals:   05/14/21 0646 05/14/21 0800 05/14/21 0905 05/14/21 1004  BP: 140/88 (!) 150/97 (!) 157/87 (!) 148/92  Pulse: 67 67 60 67  Resp: '19 18  18  '$ Temp: 97.9 F (36.6 C) (!) 97.5 F (36.4 C)    TempSrc: Oral Oral    SpO2: 99% 97% 99%   Weight:      Height:       Recent Labs    05/14/21 0311 05/14/21 0633  WBC 16.6* 15.7*  HGB 11.0* 11.0*  HCT 34.6* 33.0*  PLT 150 138*    Blood type: --/--/A POS (07/25 1215)  Rubella: Immune (07/19 0000)   I&O: I/O last 3 completed shifts: In: 1314.1 [P.O.:720; I.V.:502.7; Other:91.4] Out: 2600 [Urine:2400; Blood:200]          Total I/O In: 40 [P.O.:480] Out: 0    Gen: AAO x 3, NAD  Abdomen: soft, non-tender, non-distended            Fundus: firm, non-tender, U-2  Perineum: repair intact  Lochia: small  Extremities: n edema, no calf pain or tenderness   A/P:  PPD # 0 35 y.o., [id number]  Principal Problem:   Postpartum care following vaginal delivery 7/26  Doing well - stable status  Routine post partum orders Active Problems:   Severe preeclampsia, third trimester  Magnesium infusing for 24 hours post delivery; off at 0100 tomorrow  Reports  worsening mental status and unable to get out of bed  Will order STAT Mag level  Denies PEC symptoms  Continue PO antihypertensives as prescribed   Hypertension in pregnancy, preeclampsia, severe, delivered   SVD 7/26   First degree perineal laceration  Discussed perineal care and comfort measures.    Hypothyroidism  Continue levothyroxine 127mg   Anxiety  Stable, no meds  Close PP F/U for mood   Plan of care discussed with Dr. MBenjie Karvonen  ASuzan Nailer MSN, CNM 05/14/2021, 11:04 AM

## 2021-05-14 NOTE — Anesthesia Postprocedure Evaluation (Signed)
Anesthesia Post Note  Patient: Katherine Marshall  Procedure(s) Performed: AN AD [place]     Patient location during evaluation: Mother Baby Anesthesia Type: Epidural Level of consciousness: awake and alert Pain management: pain level controlled Vital Signs Assessment: post-procedure vital signs reviewed and stable Respiratory status: spontaneous breathing, nonlabored ventilation and respiratory function stable Cardiovascular status: stable Postop Assessment: no headache, no backache, epidural receding, patient able to bend at knees, adequate PO intake, no apparent nausea or vomiting and able to ambulate Anesthetic complications: no   No notable events documented.  Last Vitals:  Vitals:   05/14/21 1213 05/14/21 1214  BP: (!) 139/104   Pulse: 61   Resp: 18   Temp: 36.4 C   SpO2:  98%    Last Pain:  Vitals:   05/14/21 1213  TempSrc: Oral  PainSc:    Pain Goal: Patients Stated Pain Goal: 4 (05/14/21 0544)                 Clearnce Sorrel

## 2021-05-15 MED ORDER — LEVOTHYROXINE SODIUM 137 MCG PO TABS: 137.0000 ug | ORAL_TABLET | Freq: Every day | ORAL | 1 refills | Status: DC

## 2021-05-15 MED ORDER — IBUPROFEN 600 MG PO TABS: 600.0000 mg | ORAL_TABLET | Freq: Four times a day (QID) | ORAL | 0 refills | Status: DC

## 2021-05-15 MED ORDER — ACETAMINOPHEN 325 MG PO TABS: 650.0000 mg | ORAL_TABLET | ORAL | 1 refills | Status: DC | PRN

## 2021-05-15 MED ORDER — LABETALOL HCL 100 MG PO TABS: 100.0000 mg | ORAL_TABLET | Freq: Two times a day (BID) | ORAL | 1 refills | Status: DC

## 2021-05-15 MED ORDER — NIFEDIPINE ER OSMOTIC RELEASE 30 MG PO TB24
30.0000 mg | ORAL_TABLET | Freq: Two times a day (BID) | ORAL | Status: DC
  Administered 2021-05-15: 30 mg via ORAL
  Filled 2021-05-15: qty 1

## 2021-05-15 MED ORDER — NIFEDIPINE ER 30 MG PO TB24: 30.0000 mg | ORAL_TABLET | Freq: Two times a day (BID) | ORAL | 0 refills | Status: DC

## 2021-05-15 NOTE — Progress Notes (Signed)
Pt ambulated to nicu  teaching complete

## 2021-05-15 NOTE — Discharge Summary (Signed)
OB Discharge Summary  Patient Name: Katherine Marshall DOB: Mar 02, 1986 MRN: [id number]  Date of admission: 05/10/2021 Delivering provider: MODY, VAISHALI   Admitting diagnosis: Severe preeclampsia, third trimester [O14.13] Hypertension in pregnancy, preeclampsia, severe, delivered [O14.14] Intrauterine pregnancy: [redacted]w[redacted]d    Secondary diagnosis: Patient Active Problem List   Diagnosis Date Noted   Hypertension in pregnancy, preeclampsia, severe, delivered 05/14/2021   SVD 7/26 05/14/2021   Postpartum care following vaginal delivery 7/26 05/14/2021   First degree perineal laceration 05/14/2021   Hypothyroidism 05/14/2021   Anxiety 05/14/2021   Severe preeclampsia, third trimester 05/10/2021    Date of discharge: 05/15/2021   Discharge diagnosis: Principal Problem:   Postpartum care following vaginal delivery 7/26 Active Problems:   Severe preeclampsia, third trimester   Hypertension in pregnancy, preeclampsia, severe, delivered   SVD 7/26   First degree perineal laceration   Hypothyroidism   Anxiety                                                           Post partum procedures: Postpartum magnesium sulfate x 24 hours  Augmentation: AROM, Pitocin, and Cytotec Pain control: Epidural  Laceration:1st degree  Episiotomy:None  Complications: None  Hospital course:  Induction of Labor With Vaginal Delivery   35y.o. yo [id number] admitted to the hospital 05/10/2021 for induction of labor.  Indication for induction: Preeclampsia.  Patient had an uncomplicated labor course as follows: Membrane Rupture Time/Date: 9:28 PM ,05/13/2021   Delivery Method:Vaginal, Spontaneous  Episiotomy: None  Lacerations:  1st degree  Details of delivery can be found in separate delivery note. Patient had a postpartum course complicated by preeclampsia. She was given magnesium sulfate for 24 hours post delivery. She was started on PO Labetalol '100mg'$  BID and Procardia XL '30mg'$  daily. On the day of  discharge, Procardia was increased to '30mg'$  BID per consult with Dr. TaRonita HippsPatient will be seen in the office in 2 days for a blood pressure check. Patient is discharged home 05/15/21.  Newborn Data: Birth date:05/14/2021  Birth time:12:59 AM  Gender:Female  Living status:Living  Apgars:9 ,9  Weight:2021 g   Physical exam  Vitals:   05/14/21 1946 05/15/21 0015 05/15/21 0241 05/15/21 0455  BP: (!) 144/85 133/79 134/80 (!) 141/87  Pulse: 64 67 61 71  Resp: '19 16 16 16  '$ Temp: 97.9 F (36.6 C) 97.7 F (36.5 C) 97.6 F (36.4 C) 98 F (36.7 C)  TempSrc: Oral Oral Oral Oral  SpO2: 97% 96% 98% 99%  Weight:    106.5 kg  Height:       General: alert, cooperative, and no distress Lochia: appropriate Uterine Fundus: firm Incision: N/A Perineum: repair intact, no edema DVT Evaluation: No evidence of DVT seen on physical exam. No significant calf/ankle edema. Labs: Lab Results  Component Value Date   WBC 15.7 (H) 05/14/2021   HGB 11.0 (L) 05/14/2021   HCT 33.0 (L) 05/14/2021   MCV 87.8 05/14/2021   PLT 138 (L) 05/14/2021   CMP Latest Ref Rng & Units 05/14/2021  Glucose 70 - 99 mg/dL 94  BUN 6 - 20 mg/dL 10  Creatinine 0.44 - 1.00 mg/dL 0.76  Sodium 135 - 145 mmol/L 134(L)  Potassium 3.5 - 5.1 mmol/L 3.8  Chloride 98 - 111 mmol/L 105  CO2 22 -  32 mmol/L 22  Calcium 8.9 - 10.3 mg/dL 7.7(L)  Total Protein 6.5 - 8.1 g/dL 5.3(L)  Total Bilirubin 0.3 - 1.2 mg/dL 0.2(L)  Alkaline Phos 38 - 126 U/L 77  AST 15 - 41 U/L 27  ALT 0 - 44 U/L 35   Vaccines: TDaP UTD  Discharge instructions:  per After Visit Summary  After Visit Meds:  Allergies as of 05/15/2021   No Known Allergies      Medication List     STOP taking these medications    doxylamine (Sleep) 25 MG tablet Commonly known as: UNISOM   hydroxyprogesterone caproate 250 mg/mL Oil injection Commonly known as: MAKENA   lansoprazole 30 MG capsule Commonly known as: PREVACID   LORazepam 1 MG tablet Commonly  known as: Ativan   omeprazole 10 MG capsule Commonly known as: PRILOSEC       TAKE these medications    acetaminophen 325 MG tablet Commonly known as: Tylenol Take 2 tablets (650 mg total) by mouth every 4 (four) hours as needed (for pain scale < 4). What changed:  medication strength how much to take when to take this reasons to take this   ibuprofen 600 MG tablet Commonly known as: ADVIL Take 1 tablet (600 mg total) by mouth every 6 (six) hours.   labetalol 100 MG tablet Commonly known as: NORMODYNE Take 1 tablet (100 mg total) by mouth 2 (two) times daily.   levothyroxine 137 MCG tablet Commonly known as: SYNTHROID Take 1 tablet (137 mcg total) by mouth daily at 6 (six) AM. Start taking on: May 16, 2021 What changed:  medication strength how much to take when to take this   NIFEdipine 30 MG 24 hr tablet Commonly known as: ADALAT CC Take 1 tablet (30 mg total) by mouth 2 (two) times daily.   Prenatal Gummies/DHA & FA 0.4-32.5 MG Chew Chew 2 each by mouth every evening.       Diet: routine diet  Activity: Advance as tolerated. Pelvic rest for 6 weeks.   Newborn Data: Live born female  Birth Weight: 4 lb 7.3 oz (2021 g) APGAR: 9, 9  Newborn Delivery   Birth date/time: 05/14/2021 00:59:00 Delivery type: Vaginal, Spontaneous     Named [name] Baby Feeding: Bottle Disposition:NICU  Delivery Report:  Review the Delivery Report for details.    Follow up:  Follow-up Information     Azucena Fallen, MD. Schedule an appointment as soon as possible for a visit in 2 day(s).   Specialty: Obstetrics and Gynecology Why: Please make an appointment for Friday, July 29th for a blood pressure check. Contact information: [place] [address] [address] Sherley Mckenney, CNM, MSN 05/15/2021, 8:03 AM

## 2021-05-16 ENCOUNTER — Ambulatory Visit: Payer: Self-pay

## 2021-05-16 LAB — SURGICAL PATHOLOGY

## 2021-05-16 NOTE — Lactation Note (Signed)
This note was copied from a baby's chart. Lactation Consultation Note  Patient Name: Katherine Marshall [id number] Date: 05/16/2021 Reason for consult: Follow-up assessment;NICU baby;Late-preterm 34-36.6wks;Maternal endocrine disorder Age:37 hours  Visited with mom of 16 hours old LPI NICU female, baby got transferred to the NICU due to hypoglycemia. Mom got discharged yesterday, reviewed discharge instructions, engorgement and sore nipples prevention/treatment.   She's been pumping so far, she hasn't been putting baby to breast yet; baby currently on donor milk. Reviewed pumping schedule, lactogenesis II and benefits of premature milk.  Plan of care:  Encouraged mom to continue pumping consistently, at least 8 times/24 hours She'll call NICU LC PRN  No support person at this time. All questions and concerns answered.  Maternal Data   Mom's supply is WNL  Feeding Mother's Current Feeding Choice: Breast Milk Nipple Type: Nfant Extra Slow Flow (gold)  Lactation Tools Discussed/Used Tools: Pump;Flanges Flange Size: 24 Breast pump type: Double-Electric Breast Pump Pump Education: Setup, frequency, and cleaning;Milk Storage Reason for Pumping: LPI in NICU < 5 lbs Pumping frequency: q 3 hours Pumped volume: 5 mL  Interventions Interventions: Breast feeding basics reviewed;DEBP;Education  Discharge Discharge Education: Engorgement and breast care Pump: DEBP;Personal (Elvie DEBP at home)  Consult Status Consult Status: Follow-up Follow-up type: In-patient    Dennette Faulconer Francene Boyers 05/16/2021, 6:24 PM

## 2021-05-17 ENCOUNTER — Ambulatory Visit: Payer: Self-pay

## 2021-05-17 NOTE — Lactation Note (Signed)
This note was copied from a baby's chart. Lactation Consultation Note  Patient Name: Katherine Marshall [id number] Date: 05/17/2021 Reason for consult: Follow-up assessment;NICU baby Age:35 years  I followed up with Katherine Marshall and her 37 hour old daughter. She was attempting to bottle feed baby Katherine Marshall in side-lying position upon entry. We reviewed pumping basics. Katherine Marshall has been pumping every 2-3 hours. She asked some questions about the pumping schedule, and I provided clarification. I reviewed the expression setting on her Symphony pump for when her milk begins to transition. At this time, she has not transitioned to lactogenesis II.   Plan: Pump both breasts for 15-20 minutes 8-12 times a day. Pump at least 1-2 times overnight.   Katherine Marshall has an Elvie pump for home use.  Feeding Mother's Current Feeding Choice: Breast Milk and Donor Milk Nipple Type: Nfant Extra Slow Flow (gold)   Lactation Tools Discussed/Used Pump Education: Setup, frequency, and cleaning Reason for Pumping: NICU Pumping frequency: q2-3 hours Pumped volume: 0 mL (0-droplets)  Interventions Interventions: Education;Breast feeding basics reviewed  Discharge Pump: DEBP Bernita Buffy - personal)  Consult Status Consult Status: Follow-up Follow-up type: In-patient    Lenore Manner 05/17/2021, 12:59 PM

## 2021-05-20 ENCOUNTER — Inpatient Hospital Stay (HOSPITAL_COMMUNITY)
Admission: AD | Admit: 2021-05-20 | Payer: Managed Care, Other (non HMO) | Source: Home / Self Care | Admitting: Obstetrics & Gynecology

## 2021-05-20 ENCOUNTER — Inpatient Hospital Stay (HOSPITAL_COMMUNITY): Payer: Managed Care, Other (non HMO)

## 2021-05-25 ENCOUNTER — Telehealth (HOSPITAL_COMMUNITY): Payer: Self-pay

## 2021-05-25 NOTE — Telephone Encounter (Signed)
No answer. Left message to return nurse call.  Sharyn Lull [facility] 05/25/2021,1538

## 2021-06-10 ENCOUNTER — Inpatient Hospital Stay (HOSPITAL_COMMUNITY): Admit: 2021-06-10 | Payer: Self-pay

## 2021-10-22 ENCOUNTER — Other Ambulatory Visit (HOSPITAL_BASED_OUTPATIENT_CLINIC_OR_DEPARTMENT_OTHER): Payer: Self-pay

## 2021-10-22 MED ORDER — OZEMPIC (0.25 OR 0.5 MG/DOSE) 2 MG/1.5ML ~~LOC~~ SOPN
PEN_INJECTOR | SUBCUTANEOUS | 2 refills | Status: DC
  Filled 2021-10-22: qty 1.5, 28d supply, fill #0
  Filled 2021-11-18: qty 1.5, 28d supply, fill #1
  Filled 2021-12-18: qty 1.5, 28d supply, fill #2

## 2021-11-01 ENCOUNTER — Other Ambulatory Visit (HOSPITAL_BASED_OUTPATIENT_CLINIC_OR_DEPARTMENT_OTHER): Payer: Self-pay

## 2021-11-01 ENCOUNTER — Encounter (HOSPITAL_BASED_OUTPATIENT_CLINIC_OR_DEPARTMENT_OTHER): Payer: Self-pay | Admitting: Pharmacist

## 2021-11-18 ENCOUNTER — Other Ambulatory Visit (HOSPITAL_BASED_OUTPATIENT_CLINIC_OR_DEPARTMENT_OTHER): Payer: Self-pay

## 2021-11-26 ENCOUNTER — Other Ambulatory Visit (HOSPITAL_BASED_OUTPATIENT_CLINIC_OR_DEPARTMENT_OTHER): Payer: Self-pay

## 2021-11-26 ENCOUNTER — Encounter (HOSPITAL_BASED_OUTPATIENT_CLINIC_OR_DEPARTMENT_OTHER): Payer: Self-pay | Admitting: Pharmacist

## 2021-12-03 ENCOUNTER — Other Ambulatory Visit (HOSPITAL_BASED_OUTPATIENT_CLINIC_OR_DEPARTMENT_OTHER): Payer: Self-pay

## 2021-12-03 MED ORDER — FLUOXETINE HCL 10 MG PO CAPS
ORAL_CAPSULE | ORAL | 0 refills | Status: DC
  Filled 2021-12-03: qty 30, 30d supply, fill #0

## 2021-12-10 IMAGING — US US MFM OB DETAIL+14 WK
1 series · 15 of 28 positions shown · non-contrast
Comparison: none

[Series 1: us mfm ob detail+14 wk · 120 acquisitions, 15 frames shown]
[im 1/120]
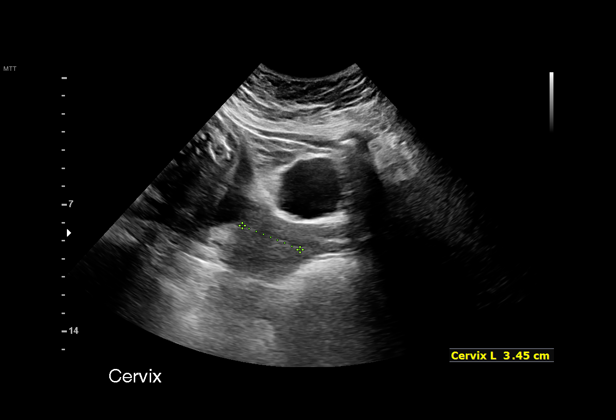
[im 9/120]
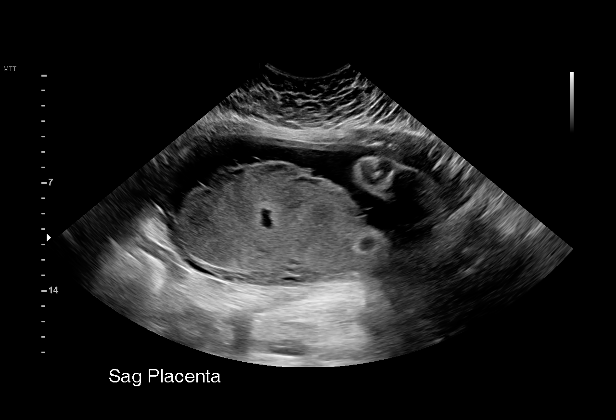
[im 18/120]
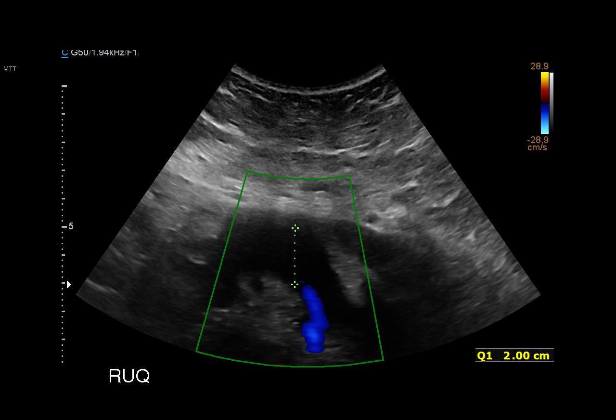
[im 27/120]
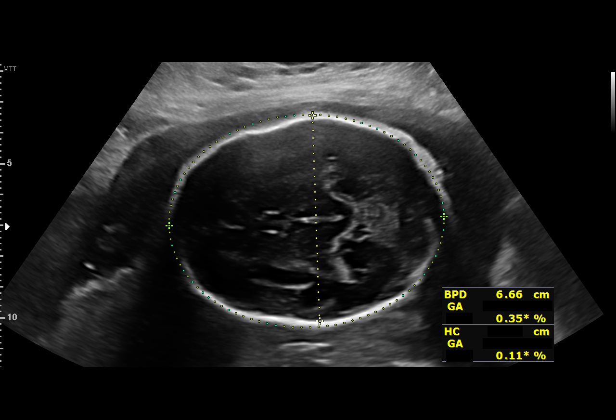
[im 36/120]
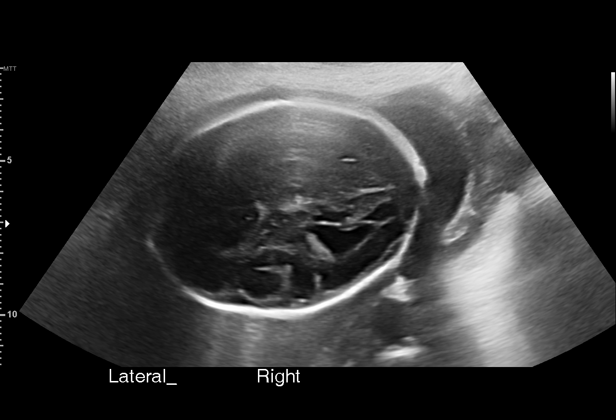
[im 45/120]
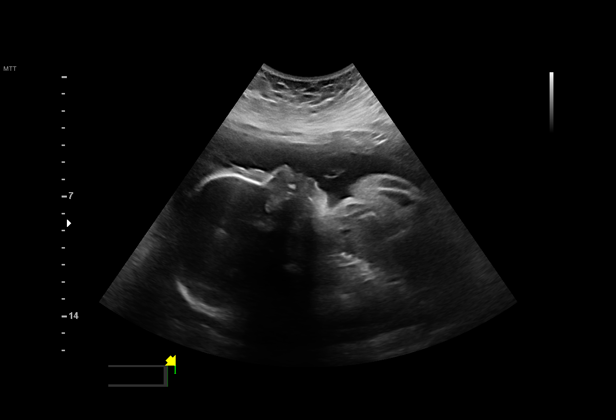
[im 53/120]
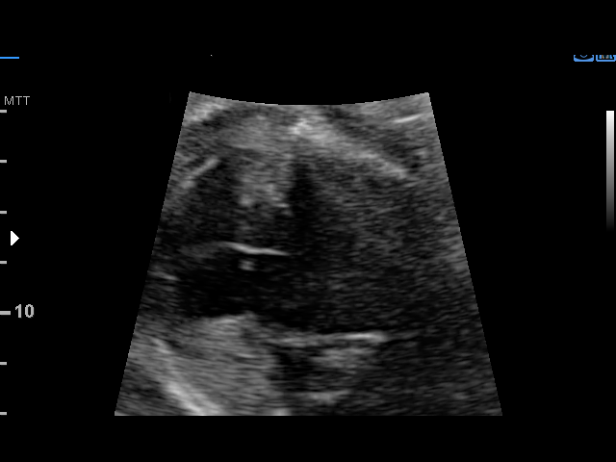
[im 62/120]
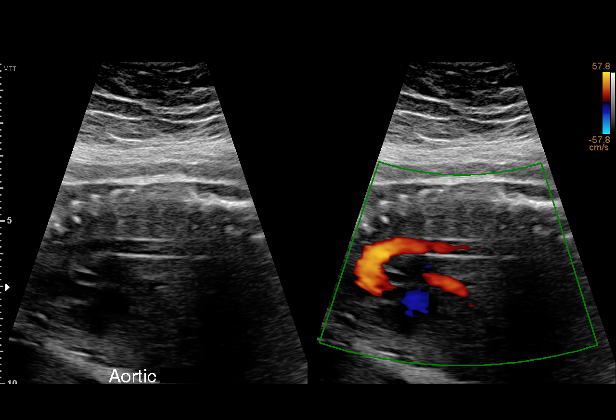
[im 67/120]
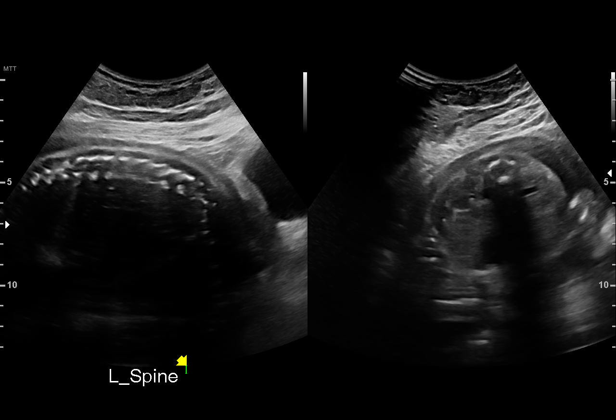
[im 75/120]
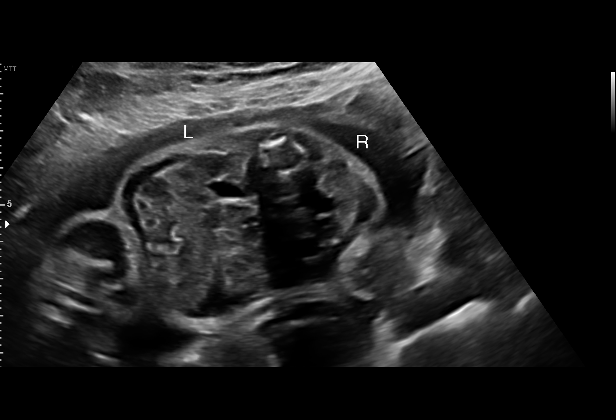
[im 84/120]
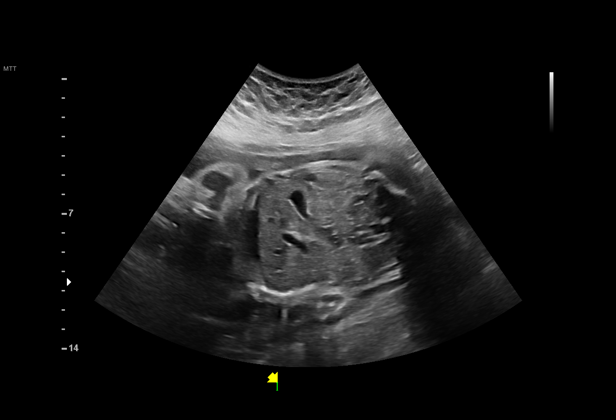
[im 93/120]
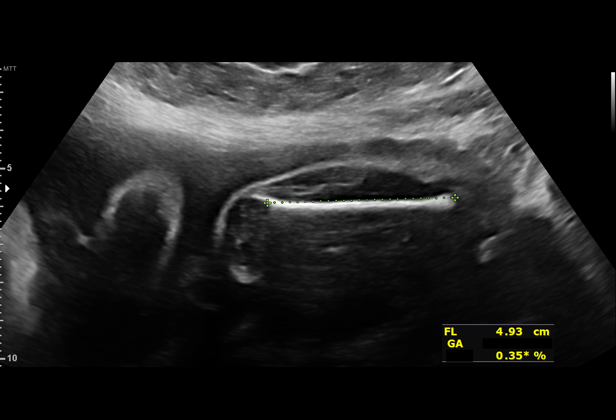
[im 102/120]
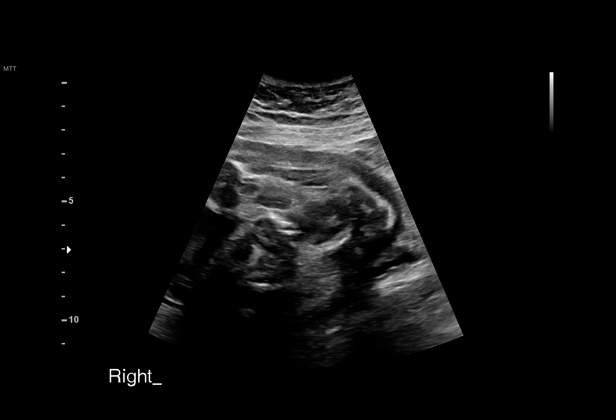
[im 111/120]
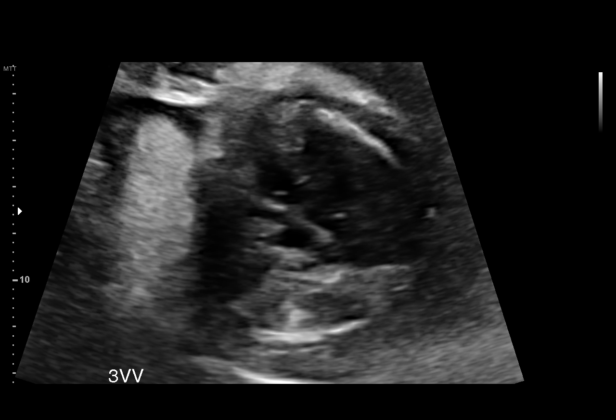
[im 120/120]
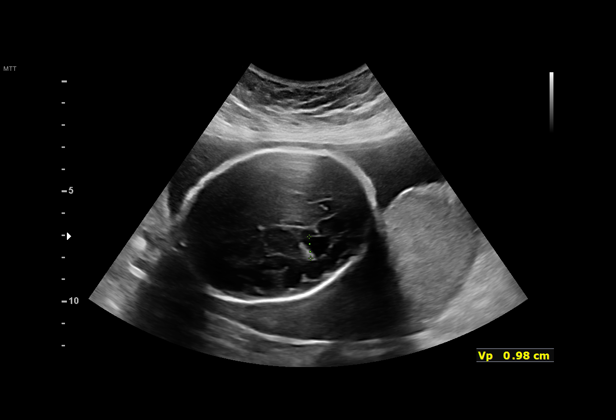

[15 of 28 positions shown; findings below may reference images not displayed]

2  US MFM UA CORD DOPPLER                76820.02    BONGJAE ROMER

Indications

 29 weeks gestation of pregnancy
 Maternal care for known or suspected poor
 fetal growth, third trimester, not applicable or
 unspecified IUGR
 Advanced maternal age multigravida 35+,
 third trimester
 Cerebral ventriculomegaly
 Poor obstetric history: Previous midtrimester
 loss (21 week fetal demise)
 Poor obstetric history-Recurrent (habitual)
 abortion (3 consecutive ab's)
 Anxiety during pregnancy, third trimester      O99.343,
 Thyroid disease in pregnancy                   O99.280,
 Poor obstetric history: Previous
 preeclampsia / eclampsia/gestational HTN
 Encounter for antenatal screening for
 malformations
 Obesity complicating pregnancy, third
 trimester
Fetal Evaluation

 Num Of Fetuses:         1
 Fetal Heart Rate(bpm):  140
 Cardiac Activity:       Observed
 Presentation:           Breech
 Placenta:               Posterior
 P. Cord Insertion:      Visualized, central
 Amniotic Fluid
 AFI FV:      Within normal limits

 AFI Sum(cm)     %Tile       Largest Pocket(cm)
 11.3            23

 RUQ(cm)       RLQ(cm)       LUQ(cm)        LLQ(cm)
 2

 Comment:    Placenta appears thickened measuring around 7 cm AP
Biometry

 BPD:      66.7  mm     G. Age:  26w 6d        < 1  %    CI:        70.81   %    70 - 86
                                                         FL/HC:      19.3   %    19.2 -
 HC:      252.6  mm     G. Age:  27w 3d        < 1  %    HC/AC:      1.08        0.99 -
 AC:      233.3  mm     G. Age:  27w 5d        4.6  %    FL/BPD:     73.0   %    71 - 87
 FL:       48.7  mm     G. Age:  26w 3d        < 1  %    FL/AC:      20.9   %    20 - 24
 HUM:      45.6  mm     G. Age:  27w 0d        < 5  %
 CER:      34.1  mm     G. Age:  28w 6d         22  %
 LV:       10.7  mm
 CM:        6.7  mm
 Est. FW:    4977  gm      2 lb 4 oz    < 1  %
OB History

 Blood Type:   A+
 Gravidity:    9         Term:   2        Prem:   1        SAB:   5
 TOP:          0       Ectopic:  0        Living: 2
Gestational Age

 LMP:           29w 4d        Date:  09/03/20                 EDD:   06/10/21
 U/S Today:     27w 1d                                        EDD:   06/27/21
 Best:          29w 4d     Det. By:  LMP  (09/03/20)          EDD:   06/10/21
Anatomy

 Cranium:               Appears normal         LVOT:                   Appears normal
 Cavum:                 Appears normal         Aortic Arch:            Appears normal
 Ventricles:            Ventriculomegaly,      Ductal Arch:            Not well visualized
                        Rt 11mm
 Choroid Plexus:        Appears normal         Diaphragm:              Appears normal
 Cerebellum:            Appears normal         Stomach:                Appears normal, left
                                                                       sided
 Posterior Fossa:       Appears normal         Abdomen:                Appears normal
 Nuchal Fold:           Not applicable (>20    Abdominal Wall:         Appears nml (cord
                        wks GA)                                        insert, abd wall)
 Face:                  Appears normal         Cord Vessels:           Appears normal (3
                        (orbits and profile)                           vessel cord)
 Lips:                  Appears normal         Kidneys:                Appear normal
 Palate:                Not well visualized    Bladder:                Appears normal
 Thoracic:              Appears normal         Spine:                  Limited views
                                                                       appear normal
 Heart:                 Appears normal         Upper Extremities:      Visualized
                        (4CH, axis, and
                        situs)
 RVOT:                  Appears normal         Lower Extremities:      Visualized
 Other:  Technically difficult due to maternal habitus, advanced gestation age,
         and fetal position. 3VV visualized.
Doppler - Fetal Vessels

 Umbilical Artery
  S/D     %tile      RI    %tile                             ADFV    RDFV
  2.62       34    0.62       38                                No      No

Cervix Uterus Adnexa

 Cervix
 Length:           3.45  cm.
 Normal appearance by transabdominal scan.

 Uterus
 No abnormality visualized.

 Right Ovary
 Within normal limits.

 Left Ovary
 Not visualized.

 Cul De Sac
 No free fluid seen.

 Adnexa
 No abnormality visualized.
Impression

 On ultrasound, the estimated fetal weight is at the 1st
 percentile.  Abdominal circumference measurement is at the
 5th percentile.  Head circumference measurement is at
 between -1 and -2 SD (normal).  Amniotic fluid is normal
 good fetal activity seen.  Umbilical artery Doppler showed
 normal forward diastolic flow.
 Of note right lateral ventricle measures 11 mm.  Intracranial
 structures appear normal. Rest of fetal anatomy appears
 normal. No evidence of intracranial or abdominal
 calcifications.
 NST is reactive for this gestational age.
 xxxxxxxxxxxxxxxxxxxxxxxxx[id number]
 Consultation ([REDACTED])

 Ms. Brough, Cath9 [id number], is here for a second opinion. At your
 office ultrasound, severe fetal growth restriction was
 suspected.
 On cell free fetal DNA screening, the risks of fetal
 aneuploidies are not increased.  MSAFP screening showed
 low risk for open neural tube defects.
 Patient was evaluated by fertility specialist before conception.
 This is natural conception and her pregnancy is well dated by
 early ultrasound.
 Obstetrical history is significant for 2 term vaginal deliveries.
 Both her sons are in good health.  Her first pregnancy was
 complicated by gestational hypertension.
 In August 2019, she had PPROM at 21-weeks' gestation.
 On arrival to the hospital, cord proloapse was noted. Patient
 underwent induction of labor and delivered a stillborn male
 infant.
 PSH: No history of hypertension or diabetes. She has
 hypothyroidism and takes Synthroid 137 mcg daily.
 Past surgical history: Agbemabiese surgery (basal cell carcinoma).
 Medications: Prenatal vitamins, low-dose aspirin.
 Allergies: No known drug allergies.
 Our concerns include:
 Fetal growth restriction
 -Possible causes of fetal growth restriction include fetal
 chromosomal anomalies or genetic syndromes. Other causes
 include placental insufficiency and fetal infections (unlikely).
 Placental insufficiency is the most-likely cause. In women
 who develop preeclampsia later in pregnancy, growth
 restriction may manifest earlier. Her first pregnancy was
 complicated by gestational hypertension.
 -Amniocentesis will be able to confirm chromosomal
 anomalies or some (not all) genetic syndromes. I discussed
 the procedure and its possible complications including
 preterm delivery (1 in 500 procedures)
 -Antenatal corticosteroids help in fetal lung maturity
 especially if administered about a week before delivery. I
 recommend antenatal corticosteroids if worsening Doppler is
 seen (absent or reversed-end-diastolic flow).
 -I briefly discussed timing of delivery that is based on interval
 fetal growth assessment and abnormal antenatal testing.

 Ventriculomegaly
 I counseled the couple that ventriculomegaly can be
 associated with fetal chromosomal malformations, genetic
 syndromes. Fetal Down syndrome is unlikely given that she
 had low risk for Down syndrome on cell-free DNA screening. I
 reassured the couple that in most cases, isolated mild
 ventriculomegaly is not associated with adverse
 neurodevelopmental outcomes. It may resolve with
 advancing gestation or after birth.
 After counseling, the patient opted not to have
 amniocentesis. She will call for an appointment if she wishes
 to proceed with amniocentesis.
Recommendations

 -Patient has follow-up BPP and UA Doppler studies at your
 office.
 -Fetal growth, BPP, UA Doppler studies and NST at our office
 in 3 weeks.
                 [place], [place]

## 2021-12-17 ENCOUNTER — Other Ambulatory Visit (HOSPITAL_BASED_OUTPATIENT_CLINIC_OR_DEPARTMENT_OTHER): Payer: Self-pay

## 2021-12-17 MED ORDER — LORAZEPAM 0.5 MG PO TABS
ORAL_TABLET | ORAL | 0 refills | Status: DC
  Filled 2021-12-17: qty 30, 30d supply, fill #0

## 2021-12-17 MED ORDER — FLUOXETINE HCL 20 MG PO CAPS
ORAL_CAPSULE | ORAL | 0 refills | Status: DC
  Filled 2021-12-17: qty 30, 30d supply, fill #0

## 2021-12-18 ENCOUNTER — Other Ambulatory Visit (HOSPITAL_BASED_OUTPATIENT_CLINIC_OR_DEPARTMENT_OTHER): Payer: Self-pay

## 2022-01-14 ENCOUNTER — Other Ambulatory Visit (HOSPITAL_BASED_OUTPATIENT_CLINIC_OR_DEPARTMENT_OTHER): Payer: Self-pay

## 2022-01-15 ENCOUNTER — Other Ambulatory Visit (HOSPITAL_BASED_OUTPATIENT_CLINIC_OR_DEPARTMENT_OTHER): Payer: Self-pay

## 2022-01-16 ENCOUNTER — Other Ambulatory Visit (HOSPITAL_BASED_OUTPATIENT_CLINIC_OR_DEPARTMENT_OTHER): Payer: Self-pay

## 2022-01-16 MED ORDER — FLUOXETINE HCL 20 MG PO CAPS
ORAL_CAPSULE | ORAL | 0 refills | Status: DC
  Filled 2022-01-16: qty 90, 90d supply, fill #0

## 2022-01-21 ENCOUNTER — Other Ambulatory Visit (HOSPITAL_BASED_OUTPATIENT_CLINIC_OR_DEPARTMENT_OTHER): Payer: Self-pay

## 2022-01-21 IMAGING — US US MFM OB FOLLOW-UP
1 series · 12 of 28 positions shown · non-contrast
Comparison: none

[Series 1: us mfm ob follow-up · 59 acquisitions, 12 frames shown]
[im 3/59]
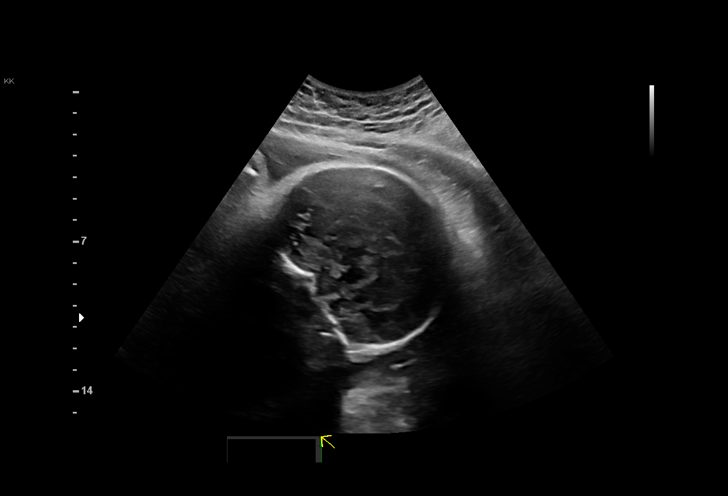
[im 7/59]
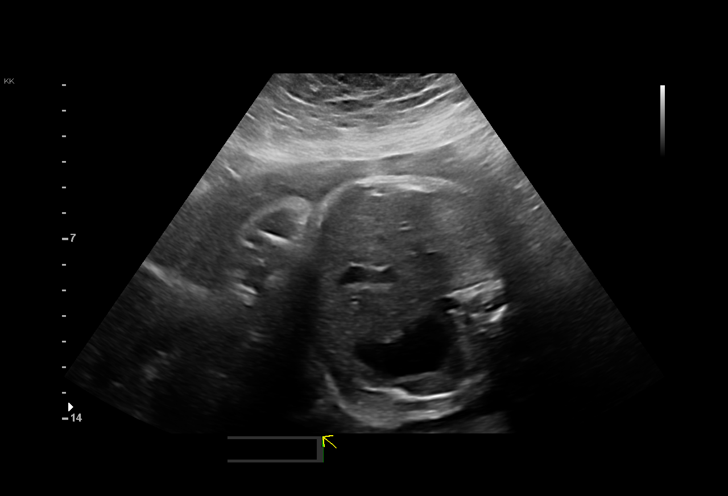
[im 11/59]
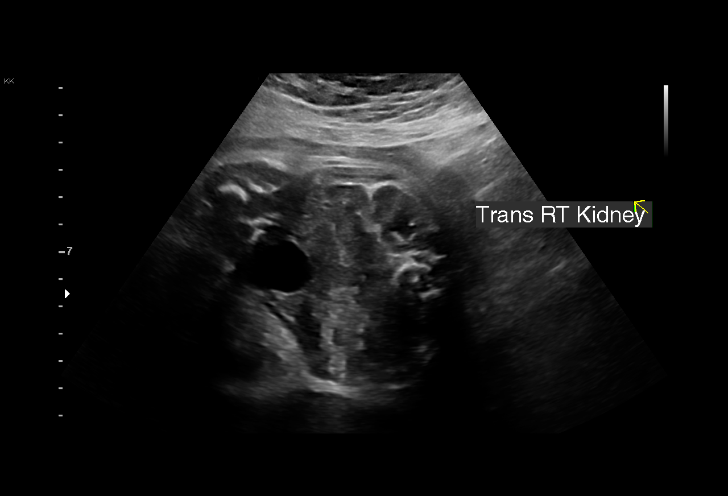
[im 18/59]
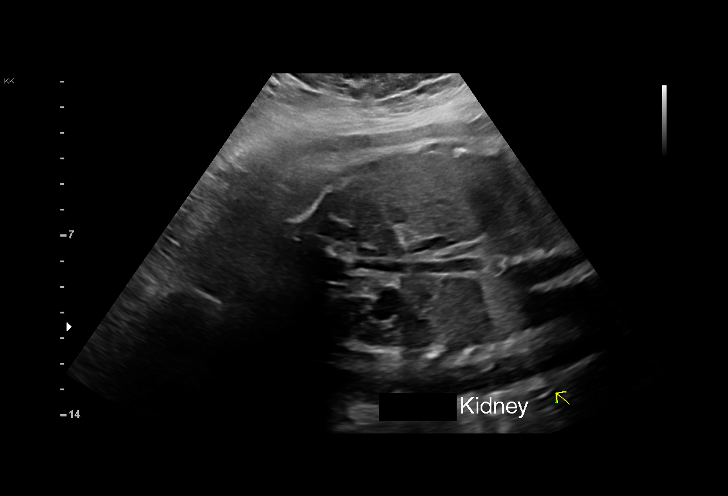
[im 22/59]
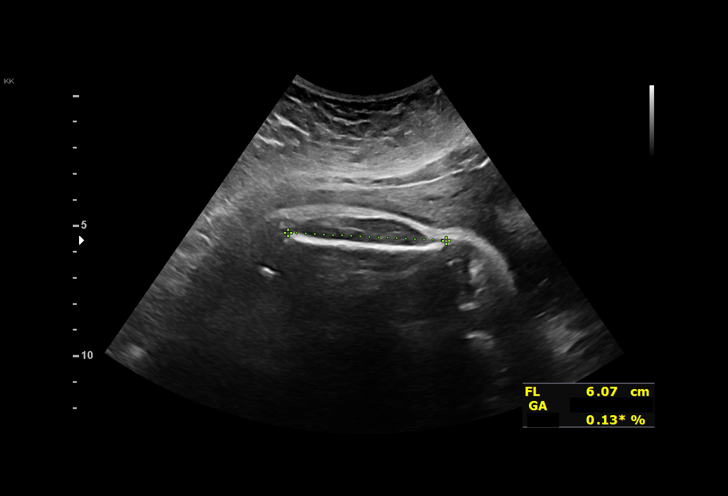
[im 26/59]
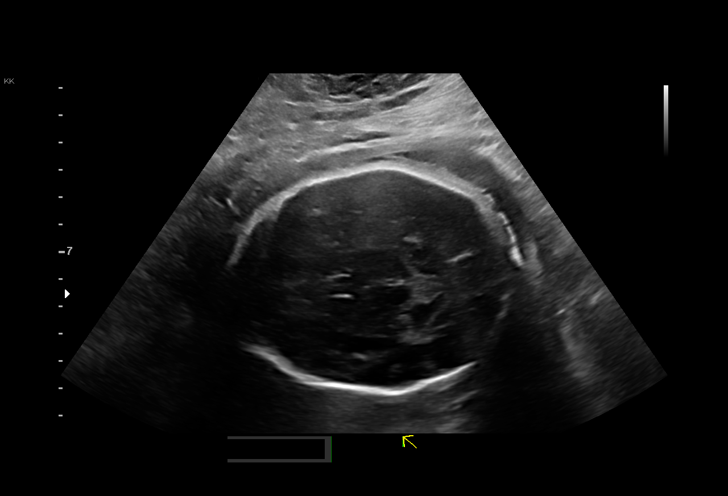
[im 33/59]
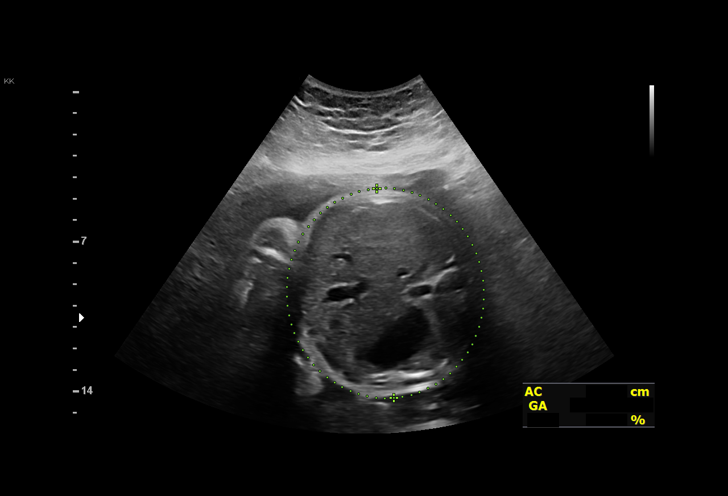
[im 37/59]
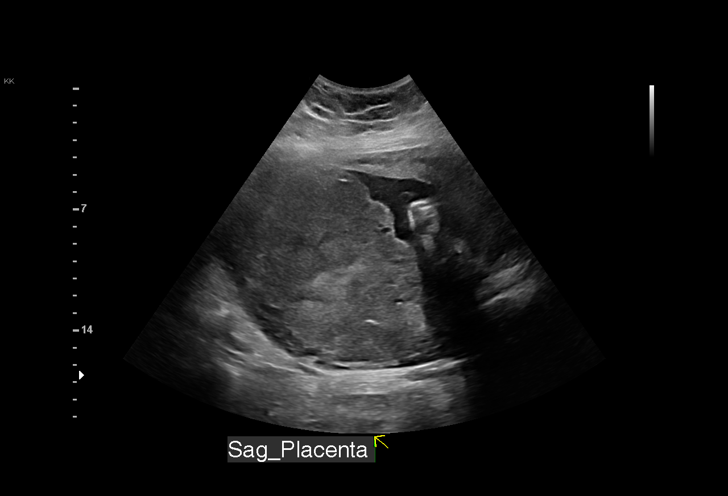
[im 41/59]
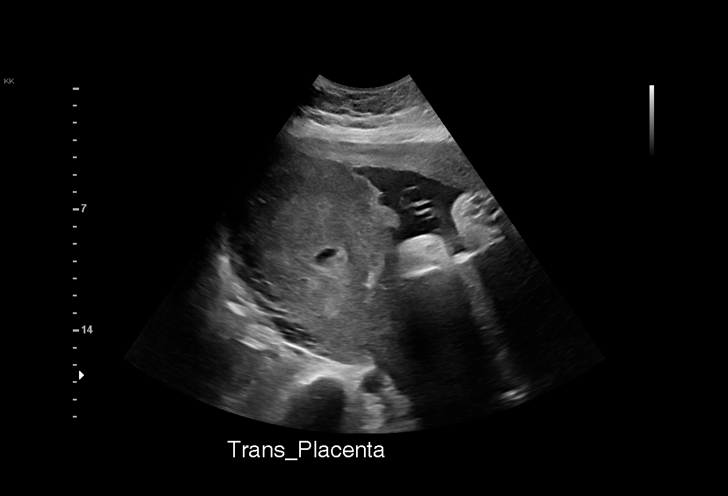
[im 48/59]
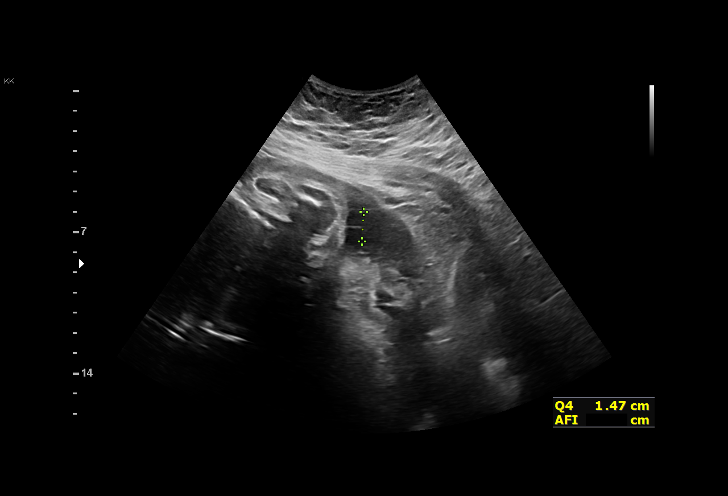
[im 52/59]
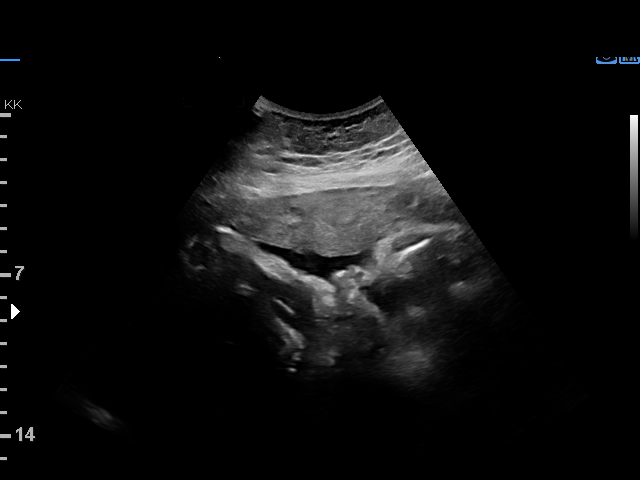
[im 56/59]
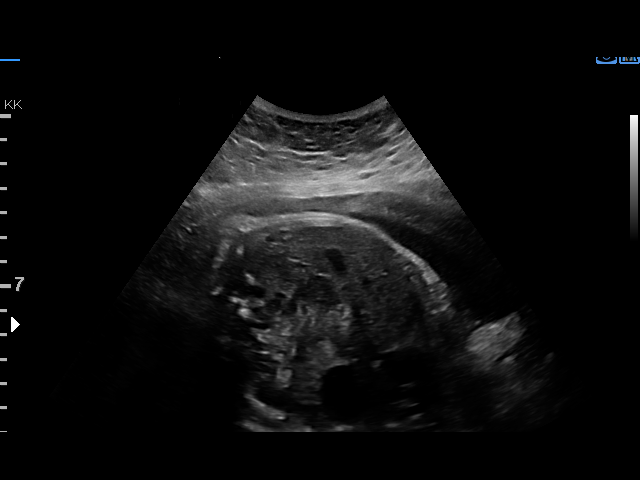

[12 of 28 positions shown; findings below may reference images not displayed]

W/NONSTRESS

Indications

 Maternal care for known or suspected poor
 fetal growth, third trimester, not applicable or
 unspecified IUGR
 35 weeks gestation of pregnancy
 Advanced maternal age multigravida 35+,
 third trimester (34 yrs)
 Cerebral ventriculomegaly
 Poor obstetric history: Previous midtrimester
 loss (21 week fetal demise)
 Poor obstetric history-Recurrent (habitual)
 abortion (3 consecutive ab's)
 Anxiety during pregnancy, third trimester      O99.343,
 Thyroid disease in pregnancy                   O99.280,
 Poor obstetric history: Previous
 preeclampsia / eclampsia/gestational HTN
 Obesity complicating pregnancy, third
 trimester
 Encounter for other antenatal screening
 follow-up
Fetal Evaluation

 Num Of Fetuses:         1
 Fetal Heart Rate(bpm):  148
 Cardiac Activity:       Observed
 Presentation:           Cephalic
 Placenta:               Posterior
 P. Cord Insertion:      Previously Visualized
 Amniotic Fluid
 AFI FV:      Within normal limits

 AFI Sum(cm)     %Tile       Largest Pocket(cm)
 10.8            27

 RUQ(cm)       RLQ(cm)       LUQ(cm)        LLQ(cm)

Biophysical Evaluation

 Amniotic F.V:   Pocket => 2 cm             F. Tone:        Observed
 F. Movement:    Observed                   N.S.T:          Reactive
 F. Breathing:   Observed                   Score:          [DATE]
Biometry

 BPD:      82.8  mm     G. Age:  33w 2d          6  %    CI:        75.58   %    70 - 86
                                                         FL/HC:      20.0   %    20.1 -
 HC:       302   mm     G. Age:  33w 4d        < 1  %    HC/AC:      1.00        0.93 -
 AC:      302.7  mm     G. Age:  34w 2d         21  %    FL/BPD:     72.8   %    71 - 87
 FL:       60.3  mm     G. Age:  31w 3d        < 1  %    FL/AC:      19.9   %    20 - 24

 Est. FW:    6090  gm    4 lb 12 oz       5  %
OB History

 Blood Type:   A+
 Gravidity:    9         Term:   2        Prem:   1        SAB:   5
 TOP:          0       Ectopic:  0        Living: 2
Gestational Age

 LMP:           35w 4d        Date:  09/03/20                 EDD:   06/10/21
 U/S Today:     33w 1d                                        EDD:   06/27/21
 Best:          35w 4d     Det. By:  LMP  (09/03/20)          EDD:   06/10/21
Anatomy

 Cranium:               Previously seen        LVOT:                   Previously seen
 Cavum:                 Appears normal         Aortic Arch:            Previously seen
 Ventricles:            Appears normal         Ductal Arch:            Not well visualized
 Choroid Plexus:        Previously seen        Diaphragm:              Appears normal
 Cerebellum:            Previously seen        Stomach:                Appears normal, left
                                                                       sided
 Posterior Fossa:       Previously seen        Abdomen:                Appears normal
 Nuchal Fold:           Not applicable (>20    Abdominal Wall:         Previously seen
                        wks GA)
 Face:                  Orbits and profile     Cord Vessels:           Previously seen
                        previously seen
 Lips:                  Previously seen        Kidneys:                Appear normal
 Palate:                Not well visualized    Bladder:                Appears normal
 Thoracic:              Previously seen        Spine:                  Limited views
                                                                       previously seen
 Heart:                 Appears normal         Upper Extremities:      Visualized
                        (4CH, axis, and
                        situs)
 RVOT:                  Previously seen        Lower Extremities:      Visualized

 Other:  Technically difficult due to maternal habitus, advanced gestation age,
         and fetal position.
Targeted Anatomy

 Thorax
 3 Vessel View:         Previously seen
Doppler - Fetal Vessels

 Umbilical Artery
  S/D     %tile      RI    %tile      PI    %tile     PSV    ADFV    RDFV
                                                    (cm/s)
  2.95       79    0.66       83    1.[REDACTED]      No      No

Cervix Uterus Adnexa

 Cervix
 Not visualized (advanced GA >23wks)
Comments

 This patient was seen for a follow up growth scan due to fetal
 growth restriction noted during her prior ultrasound exams.
 She denies any problems since her last exam and reports
 feeling vigorous fetal movements throughout the day.  The
 patient's blood pressures in our office today were 147/85 and
 159/104.  She reports that she has been experiencing on and
 off headaches along with seeing gray floaters for the past few
 days.  She does have a history of preeclampsia in her prior
 pregnancy.
 On today's exam, the EFW measures at the 5th percentile for
 her gestational age indicating fetal growth restriction.  The
 fetus has grown over 1 pound over the past 3 weeks.  There
 was normal amniotic fluid noted.
 A biophysical profile performed today due to fetal growth
 restriction was [DATE].  She also had a reactive NST
 following her ultrasound exam making her total biophysical
 profile score [DATE].
 Doppler studies of the umbilical arteries showed a normal
 S/D ratio of 2.95.  There were no signs of absent or reversed
 end-diastolic flow.
 Due to concerns regarding preeclampsia due to her elevated
 blood pressures and symptoms, the patient was sent to the
 [facility] for further evaluation.  Should she be diagnosed with
 preeclampsia, delivery would be recommended once she
 receives a complete course of antenatal corticosteroids.

 Should preeclampsia be ruled out and she is discharged
 home, the patient already has an induction scheduled on
 May 20, 2021.  She is already scheduled for another
 biophysical profile in your office next week.

## 2022-01-21 MED ORDER — OZEMPIC (1 MG/DOSE) 4 MG/3ML ~~LOC~~ SOPN
PEN_INJECTOR | SUBCUTANEOUS | 6 refills | Status: DC
  Filled 2022-01-21: qty 3, 28d supply, fill #0
  Filled 2022-03-05: qty 3, 28d supply, fill #1
  Filled 2022-04-14: qty 3, 28d supply, fill #2
  Filled 2022-05-16: qty 3, 28d supply, fill #3
  Filled 2022-06-16: qty 3, 28d supply, fill #4

## 2022-01-30 ENCOUNTER — Emergency Department (HOSPITAL_COMMUNITY)
Admission: EM | Admit: 2022-01-30 | Discharge: 2022-01-30 | Discharge status: Against Medical Advice | Payer: Managed Care, Other (non HMO)

## 2022-01-30 ENCOUNTER — Other Ambulatory Visit (HOSPITAL_BASED_OUTPATIENT_CLINIC_OR_DEPARTMENT_OTHER): Payer: Self-pay

## 2022-01-30 MED ORDER — LEVOTHYROXINE SODIUM 88 MCG PO TABS
ORAL_TABLET | ORAL | 1 refills | Status: DC
  Filled 2022-01-30 – 2022-02-12 (×2): qty 30, 30d supply, fill #0
  Filled 2022-03-17: qty 30, 30d supply, fill #1
  Filled 2022-04-14: qty 30, 30d supply, fill #2
  Filled 2022-05-16: qty 30, 30d supply, fill #3
  Filled 2022-06-16: qty 30, 30d supply, fill #4
  Filled 2022-08-04: qty 30, 30d supply, fill #5

## 2022-01-30 NOTE — ED Notes (Signed)
Pt called 3x for triage, no answer ?

## 2022-01-30 NOTE — ED Notes (Signed)
Name called again for triage - no answer ?

## 2022-02-11 ENCOUNTER — Other Ambulatory Visit (HOSPITAL_BASED_OUTPATIENT_CLINIC_OR_DEPARTMENT_OTHER): Payer: Self-pay

## 2022-02-12 ENCOUNTER — Other Ambulatory Visit (HOSPITAL_BASED_OUTPATIENT_CLINIC_OR_DEPARTMENT_OTHER): Payer: Self-pay

## 2022-03-05 ENCOUNTER — Other Ambulatory Visit (HOSPITAL_BASED_OUTPATIENT_CLINIC_OR_DEPARTMENT_OTHER): Payer: Self-pay

## 2022-03-18 ENCOUNTER — Other Ambulatory Visit (HOSPITAL_BASED_OUTPATIENT_CLINIC_OR_DEPARTMENT_OTHER): Payer: Self-pay

## 2022-03-19 ENCOUNTER — Other Ambulatory Visit (HOSPITAL_BASED_OUTPATIENT_CLINIC_OR_DEPARTMENT_OTHER): Payer: Self-pay

## 2022-04-10 ENCOUNTER — Other Ambulatory Visit (HOSPITAL_BASED_OUTPATIENT_CLINIC_OR_DEPARTMENT_OTHER): Payer: Self-pay

## 2022-04-10 MED ORDER — FLUOXETINE HCL 40 MG PO CAPS
ORAL_CAPSULE | ORAL | 0 refills | Status: DC
  Filled 2022-04-10: qty 30, 30d supply, fill #0

## 2022-04-10 MED ORDER — LORAZEPAM 0.5 MG PO TABS
ORAL_TABLET | ORAL | 0 refills | Status: DC
  Filled 2022-04-10: qty 30, 30d supply, fill #0

## 2022-04-14 ENCOUNTER — Other Ambulatory Visit (HOSPITAL_BASED_OUTPATIENT_CLINIC_OR_DEPARTMENT_OTHER): Payer: Self-pay

## 2022-04-15 ENCOUNTER — Other Ambulatory Visit (HOSPITAL_BASED_OUTPATIENT_CLINIC_OR_DEPARTMENT_OTHER): Payer: Self-pay

## 2022-05-05 ENCOUNTER — Other Ambulatory Visit (HOSPITAL_BASED_OUTPATIENT_CLINIC_OR_DEPARTMENT_OTHER): Payer: Self-pay

## 2022-05-08 ENCOUNTER — Other Ambulatory Visit (HOSPITAL_BASED_OUTPATIENT_CLINIC_OR_DEPARTMENT_OTHER): Payer: Self-pay

## 2022-05-08 MED ORDER — FLUOXETINE HCL 40 MG PO CAPS
ORAL_CAPSULE | ORAL | 0 refills | Status: DC
  Filled 2022-05-08: qty 30, 30d supply, fill #0
  Filled 2022-06-16: qty 30, 30d supply, fill #1
  Filled 2022-07-14: qty 30, 30d supply, fill #2

## 2022-05-16 ENCOUNTER — Other Ambulatory Visit (HOSPITAL_BASED_OUTPATIENT_CLINIC_OR_DEPARTMENT_OTHER): Payer: Self-pay

## 2022-06-16 ENCOUNTER — Other Ambulatory Visit (HOSPITAL_BASED_OUTPATIENT_CLINIC_OR_DEPARTMENT_OTHER): Payer: Self-pay

## 2022-06-17 ENCOUNTER — Other Ambulatory Visit (HOSPITAL_BASED_OUTPATIENT_CLINIC_OR_DEPARTMENT_OTHER): Payer: Self-pay

## 2022-06-17 ENCOUNTER — Other Ambulatory Visit (HOSPITAL_COMMUNITY)
Admission: RE | Admit: 2022-06-17 | Discharge: 2022-06-17 | Disposition: A | Discharge status: Home / Self Care | Payer: Managed Care, Other (non HMO) | Source: Ambulatory Visit | Attending: Radiology | Admitting: Radiology

## 2022-06-17 ENCOUNTER — Encounter: Payer: Self-pay | Admitting: Radiology

## 2022-06-17 ENCOUNTER — Ambulatory Visit (INDEPENDENT_AMBULATORY_CARE_PROVIDER_SITE_OTHER): Payer: Managed Care, Other (non HMO) | Admitting: Radiology

## 2022-06-17 VITALS — BP 122/76 | Ht 60.0 in | Wt 176.0 lb

## 2022-06-17 DIAGNOSIS — Z01419 Encounter for gynecological examination (general) (routine) without abnormal findings: Secondary | ICD-10-CM | POA: Diagnosis not present

## 2022-06-17 DIAGNOSIS — N760 Acute vaginitis: Secondary | ICD-10-CM

## 2022-06-17 DIAGNOSIS — E8881 Metabolic syndrome: Secondary | ICD-10-CM | POA: Diagnosis not present

## 2022-06-17 DIAGNOSIS — E88819 Insulin resistance, unspecified: Secondary | ICD-10-CM

## 2022-06-17 DIAGNOSIS — E282 Polycystic ovarian syndrome: Secondary | ICD-10-CM | POA: Diagnosis not present

## 2022-06-17 DIAGNOSIS — E063 Autoimmune thyroiditis: Secondary | ICD-10-CM

## 2022-06-17 MED ORDER — NYSTATIN-TRIAMCINOLONE 100000-0.1 UNIT/GM-% EX OINT
1.0000 | TOPICAL_OINTMENT | Freq: Two times a day (BID) | CUTANEOUS | 0 refills | Status: DC
  Filled 2022-06-17: qty 30, 15d supply, fill #0

## 2022-06-17 MED ORDER — OZEMPIC (2 MG/DOSE) 8 MG/3ML ~~LOC~~ SOPN
2.0000 mg | PEN_INJECTOR | SUBCUTANEOUS | 4 refills | Status: DC
  Filled 2022-06-17 – 2022-07-13 (×2): qty 3, 28d supply, fill #0
  Filled 2022-08-04: qty 3, 28d supply, fill #1

## 2022-06-17 NOTE — Progress Notes (Signed)
Katherine Marshall September 10, 1986 [id number]   History:  36 y.o. G9P3 presents for annual exam as a new patient. Transfer from [employer]. Hs oc PCOS and insulin resistance, on ozempic prescribed by endocrinologist who just left the practice. Patient has lost 30lbs, feels her weight loss has stalled on the 81m dose.   Gynecologic History Patient's last menstrual period was 05/29/2022 (approximate). Period Cycle (Days): 28 Period Duration (Days): 5 Period Pattern: Regular Menstrual Flow: Moderate Menstrual Control: Tampon, Maxi pad Dysmenorrhea: (!) Mild Dysmenorrhea Symptoms: Cramping Contraception/Family planning: vasectomy Sexually active: yes Last Pap: 2020. Results were: normal Last mammogram: never  Obstetric History OB History  Gravida Para Term Preterm AB Living  '9 4 2 2 5 3  ' SAB IAB Ectopic Multiple Live Births  5 0 0 0 3    # Outcome Date GA Lbr Len/2nd Weight Sex Delivery Anes PTL Lv  9 Preterm 05/14/21 325w1d 00:08 4 lb 7.3 oz (2.021 kg) F Vag-Spont EPI  LIV  8 Preterm 08/21/19 2183w3d00:17 10.7 oz (0.303 kg) M Vag-Spont EPI  FD     Birth Comments: n/a  7 Term 10/04/16 38w68w3d10 / 00:28 6 lb 0.7 oz (2.741 kg) M Vag-Spont EPI  LIV     Birth Comments: none  6 Term 04/14/14 38w453w4d15 / 02:10 5 lb 5 oz (2.41 kg) M Vag-Spont EPI  LIV     Complications: Preeclampsia  5 SAB           4 SAB           3 SAB           2 SAB           1 SAB              The following portions of the patient's history were reviewed and updated as appropriate: allergies, current medications, past family history, past medical history, past social history, past surgical history, and problem list.  Review of Systems Pertinent items noted in HPI and remainder of comprehensive ROS otherwise negative.   Past medical history, past surgical history, family history and social history were all reviewed and documented in the EPIC chart.   Exam:  Vitals:   06/17/22 0824  BP: 122/76  Weight: 176  lb (79.8 kg)  Height: 5' (1.524 m)   Body mass index is 34.37 kg/m.  General appearance:  Normal Thyroid:  Symmetrical, normal in size, without palpable masses or nodularity. Respiratory  Auscultation:  Clear without wheezing or rhonchi Cardiovascular  Auscultation:  Regular rate, without rubs, murmurs or gallops  Edema/varicosities:  Not grossly evident Abdominal  Soft,nontender, without masses, guarding or rebound.  Liver/spleen:  No organomegaly noted  Hernia:  None appreciated  Skin  Inspection:  Grossly normal Breasts: Examined lying and sitting.   Right: Without masses, retractions, nipple discharge or axillary adenopathy.   Left: Without masses, retractions, nipple discharge or axillary adenopathy. Genitourinary   Inguinal/mons:  Normal without inguinal adenopathy  External genitalia:  Normal appearing vulva with no masses, tenderness, or lesions  BUS/Urethra/Skene's glands:  Normal without masses or exudate  Vagina:  Normal appearing with normal color and discharge, no lesions  Cervix:  Normal appearing without discharge or lesions  Uterus:  Normal in size, shape and contour.  Mobile, nontender  Adnexa/parametria:     Rt: Normal in size, without masses or tenderness.   Lt: Normal in size, without masses or tenderness.  Anus and perineum: Normal   Patient informed  chaperone available to be present for breast and pelvic exam. Patient has requested no chaperone to be present. Patient has been advised what will be completed during breast and pelvic exam.   Assessment/Plan:   1. Well woman exam with routine gynecological exam - CBC - Comp Met (CMET) - Vitamin D (25 hydroxy) - Cytology - PAP( [place])  2. PCOS (polycystic ovarian syndrome) Will increase dose to 12m to help with weight loss until she establishes with new endocrinologist Increase weight bearing exercise - Semaglutide, 2 MG/DOSE, (OZEMPIC, 2 MG/DOSE,) 8 MG/3ML SOPN; Inject 2 mg into the skin once a  week.  Dispense: 3 mL; Refill: 4  3. Morbid obesity ([place] - Lipid Profile  4. Insulin resistance - HgB A1c  5. Hashimoto's thyroiditis - Thyroid Panel With TSH    Discussed SBE, pap and mammogram screening as directed/appropriate. Recommend 1556ms of exercise weekly, including weight bearing exercise. Encouraged the use of seatbelts and sunscreen. Return in 1 year for annual or as needed.   CHRubbie Battiest WHNP-BC 9:08 AM 06/17/2022

## 2022-06-18 LAB — COMPREHENSIVE METABOLIC PANEL
AG Ratio: 1.5 (calc) (ref 1.0–2.5)
ALT: 10 U/L (ref 6–29)
AST: 14 U/L (ref 10–30)
Albumin: 4.6 g/dL (ref 3.6–5.1)
Alkaline phosphatase (APISO): 66 U/L (ref 31–125)
BUN: 14 mg/dL (ref 7–25)
CO2: 27 mmol/L (ref 20–32)
Calcium: 9.4 mg/dL (ref 8.6–10.2)
Chloride: 102 mmol/L (ref 98–110)
Creat: 0.92 mg/dL (ref 0.50–0.97)
Globulin: 3.1 g/dL (calc) (ref 1.9–3.7)
Glucose, Bld: 80 mg/dL (ref 65–99)
Potassium: 4.6 mmol/L (ref 3.5–5.3)
Sodium: 137 mmol/L (ref 135–146)
Total Bilirubin: 0.6 mg/dL (ref 0.2–1.2)
Total Protein: 7.7 g/dL (ref 6.1–8.1)

## 2022-06-18 LAB — HEMOGLOBIN A1C
Hgb A1c MFr Bld: 5 % of total Hgb (ref <5.7)
Mean Plasma Glucose: 97 mg/dL
eAG (mmol/L): 5.4 mmol/L

## 2022-06-18 LAB — THYROID PANEL WITH TSH
Free Thyroxine Index: 3 (ref 1.4–3.8)
T3 Uptake: 29 % (ref 22–35)
T4, Total: 10.4 ug/dL (ref 5.1–11.9)
TSH: 2.12 mIU/L

## 2022-06-18 LAB — LIPID PANEL
Cholesterol: 141 mg/dL (ref <200)
HDL: 57 mg/dL (ref >=50)
LDL Cholesterol (Calc): 70 mg/dL (calc)
Non-HDL Cholesterol (Calc): 84 mg/dL (calc) (ref <130)
Total CHOL/HDL Ratio: 2.5 (calc) (ref <5.0)
Triglycerides: 62 mg/dL (ref <150)

## 2022-06-18 LAB — CBC
HCT: 42.4 % (ref 35.0–45.0)
Hemoglobin: 14.1 g/dL (ref 11.7–15.5)
MCH: 30.7 pg (ref 27.0–33.0)
MCHC: 33.3 g/dL (ref 32.0–36.0)
MCV: 92.2 fL (ref 80.0–100.0)
MPV: 12.9 fL — ABNORMAL HIGH (ref 7.5–12.5)
Platelets: 184 10*3/uL (ref 140–400)
RBC: 4.6 10*6/uL (ref 3.80–5.10)
RDW: 12.5 % (ref 11.0–15.0)
WBC: 6.4 10*3/uL (ref 3.8–10.8)

## 2022-06-18 LAB — VITAMIN D 25 HYDROXY (VIT D DEFICIENCY, FRACTURES): Vit D, 25-Hydroxy: 31 ng/mL (ref 30–100)

## 2022-06-20 LAB — CYTOLOGY - PAP
Comment: NEGATIVE
Diagnosis: NEGATIVE
High risk HPV: NEGATIVE

## 2022-07-13 ENCOUNTER — Other Ambulatory Visit (HOSPITAL_BASED_OUTPATIENT_CLINIC_OR_DEPARTMENT_OTHER): Payer: Self-pay

## 2022-07-14 ENCOUNTER — Other Ambulatory Visit (HOSPITAL_BASED_OUTPATIENT_CLINIC_OR_DEPARTMENT_OTHER): Payer: Self-pay

## 2022-08-04 ENCOUNTER — Other Ambulatory Visit (HOSPITAL_BASED_OUTPATIENT_CLINIC_OR_DEPARTMENT_OTHER): Payer: Self-pay

## 2022-08-08 ENCOUNTER — Other Ambulatory Visit (HOSPITAL_BASED_OUTPATIENT_CLINIC_OR_DEPARTMENT_OTHER): Payer: Self-pay

## 2022-08-12 ENCOUNTER — Other Ambulatory Visit: Payer: Self-pay | Admitting: Radiology

## 2022-08-12 ENCOUNTER — Other Ambulatory Visit (HOSPITAL_BASED_OUTPATIENT_CLINIC_OR_DEPARTMENT_OTHER): Payer: Self-pay

## 2022-08-12 MED ORDER — FLUOXETINE HCL 40 MG PO CAPS
40.0000 mg | ORAL_CAPSULE | Freq: Every day | ORAL | 2 refills | Status: DC
  Filled 2022-08-12: qty 30, 30d supply, fill #0
  Filled 2022-09-05: qty 30, 30d supply, fill #1
  Filled 2022-10-09: qty 30, 30d supply, fill #2
  Filled 2022-11-05: qty 30, 30d supply, fill #3
  Filled 2022-12-17: qty 30, 30d supply, fill #4
  Filled 2023-01-27: qty 30, 30d supply, fill #5
  Filled 2023-02-25: qty 30, 30d supply, fill #6
  Filled 2023-03-24: qty 30, 30d supply, fill #7
  Filled 2023-05-04: qty 30, 30d supply, fill #8

## 2022-08-12 NOTE — Telephone Encounter (Signed)
Pharmacy added.

## 2022-08-19 ENCOUNTER — Other Ambulatory Visit (HOSPITAL_BASED_OUTPATIENT_CLINIC_OR_DEPARTMENT_OTHER): Payer: Self-pay

## 2022-08-19 ENCOUNTER — Other Ambulatory Visit: Payer: Self-pay | Admitting: Radiology

## 2022-08-19 DIAGNOSIS — E88819 Insulin resistance, unspecified: Secondary | ICD-10-CM

## 2022-08-19 MED ORDER — MOUNJARO 2.5 MG/0.5ML ~~LOC~~ SOAJ
2.5000 mg | SUBCUTANEOUS | 0 refills | Status: DC
  Filled 2022-08-19: qty 2, 28d supply, fill #0

## 2022-08-26 ENCOUNTER — Other Ambulatory Visit (HOSPITAL_COMMUNITY): Payer: Self-pay

## 2022-08-30 ENCOUNTER — Other Ambulatory Visit (HOSPITAL_BASED_OUTPATIENT_CLINIC_OR_DEPARTMENT_OTHER): Payer: Self-pay

## 2022-09-01 ENCOUNTER — Other Ambulatory Visit (HOSPITAL_BASED_OUTPATIENT_CLINIC_OR_DEPARTMENT_OTHER): Payer: Self-pay

## 2022-09-01 MED ORDER — LEVOTHYROXINE SODIUM 88 MCG PO TABS
88.0000 ug | ORAL_TABLET | Freq: Every morning | ORAL | 0 refills | Status: DC
  Filled 2022-09-01: qty 30, 30d supply, fill #0

## 2022-09-02 ENCOUNTER — Other Ambulatory Visit (HOSPITAL_BASED_OUTPATIENT_CLINIC_OR_DEPARTMENT_OTHER): Payer: Self-pay

## 2022-09-03 ENCOUNTER — Other Ambulatory Visit (HOSPITAL_BASED_OUTPATIENT_CLINIC_OR_DEPARTMENT_OTHER): Payer: Self-pay

## 2022-09-04 ENCOUNTER — Encounter (HOSPITAL_BASED_OUTPATIENT_CLINIC_OR_DEPARTMENT_OTHER): Payer: Self-pay | Admitting: Pharmacist

## 2022-09-04 ENCOUNTER — Other Ambulatory Visit (HOSPITAL_BASED_OUTPATIENT_CLINIC_OR_DEPARTMENT_OTHER): Payer: Self-pay

## 2022-09-05 ENCOUNTER — Other Ambulatory Visit (HOSPITAL_BASED_OUTPATIENT_CLINIC_OR_DEPARTMENT_OTHER): Payer: Self-pay

## 2022-09-15 ENCOUNTER — Encounter (HOSPITAL_BASED_OUTPATIENT_CLINIC_OR_DEPARTMENT_OTHER): Payer: Self-pay

## 2022-09-15 ENCOUNTER — Other Ambulatory Visit (HOSPITAL_BASED_OUTPATIENT_CLINIC_OR_DEPARTMENT_OTHER): Payer: Self-pay

## 2022-09-16 ENCOUNTER — Other Ambulatory Visit: Payer: Self-pay | Admitting: Radiology

## 2022-09-16 ENCOUNTER — Other Ambulatory Visit (HOSPITAL_BASED_OUTPATIENT_CLINIC_OR_DEPARTMENT_OTHER): Payer: Self-pay

## 2022-09-16 DIAGNOSIS — E282 Polycystic ovarian syndrome: Secondary | ICD-10-CM

## 2022-09-16 DIAGNOSIS — E88819 Insulin resistance, unspecified: Secondary | ICD-10-CM

## 2022-09-16 MED ORDER — MOUNJARO 5 MG/0.5ML ~~LOC~~ SOAJ
5.0000 mg | SUBCUTANEOUS | 1 refills | Status: DC
  Filled 2022-09-16: qty 2, 28d supply, fill #0
  Filled 2022-10-09: qty 2, 28d supply, fill #1

## 2022-10-02 ENCOUNTER — Other Ambulatory Visit (HOSPITAL_BASED_OUTPATIENT_CLINIC_OR_DEPARTMENT_OTHER): Payer: Self-pay

## 2022-10-07 ENCOUNTER — Other Ambulatory Visit (HOSPITAL_BASED_OUTPATIENT_CLINIC_OR_DEPARTMENT_OTHER): Payer: Self-pay

## 2022-10-07 MED ORDER — LEVOTHYROXINE SODIUM 88 MCG PO TABS
88.0000 ug | ORAL_TABLET | Freq: Every morning | ORAL | 1 refills | Status: DC
  Filled 2022-10-07: qty 30, 30d supply, fill #0
  Filled 2022-11-05: qty 30, 30d supply, fill #1

## 2022-11-04 ENCOUNTER — Other Ambulatory Visit (HOSPITAL_BASED_OUTPATIENT_CLINIC_OR_DEPARTMENT_OTHER): Payer: Self-pay

## 2022-11-04 ENCOUNTER — Other Ambulatory Visit: Payer: Self-pay | Admitting: Radiology

## 2022-11-04 DIAGNOSIS — E282 Polycystic ovarian syndrome: Secondary | ICD-10-CM

## 2022-11-04 DIAGNOSIS — E88819 Insulin resistance, unspecified: Secondary | ICD-10-CM

## 2022-11-04 MED ORDER — MOUNJARO 7.5 MG/0.5ML ~~LOC~~ SOAJ
7.5000 mg | SUBCUTANEOUS | 1 refills | Status: DC
  Filled 2022-11-04 – 2022-12-08 (×2): qty 2, 28d supply, fill #0

## 2022-11-05 ENCOUNTER — Other Ambulatory Visit (HOSPITAL_BASED_OUTPATIENT_CLINIC_OR_DEPARTMENT_OTHER): Payer: Self-pay

## 2022-11-08 ENCOUNTER — Other Ambulatory Visit (HOSPITAL_BASED_OUTPATIENT_CLINIC_OR_DEPARTMENT_OTHER): Payer: Self-pay

## 2022-11-12 ENCOUNTER — Other Ambulatory Visit (HOSPITAL_BASED_OUTPATIENT_CLINIC_OR_DEPARTMENT_OTHER): Payer: Self-pay

## 2022-11-13 ENCOUNTER — Other Ambulatory Visit (HOSPITAL_BASED_OUTPATIENT_CLINIC_OR_DEPARTMENT_OTHER): Payer: Self-pay

## 2022-11-14 ENCOUNTER — Other Ambulatory Visit (HOSPITAL_BASED_OUTPATIENT_CLINIC_OR_DEPARTMENT_OTHER): Payer: Self-pay

## 2022-11-19 ENCOUNTER — Encounter (HOSPITAL_BASED_OUTPATIENT_CLINIC_OR_DEPARTMENT_OTHER): Payer: Self-pay | Admitting: Pharmacist

## 2022-11-19 ENCOUNTER — Other Ambulatory Visit (HOSPITAL_BASED_OUTPATIENT_CLINIC_OR_DEPARTMENT_OTHER): Payer: Self-pay

## 2022-11-20 ENCOUNTER — Other Ambulatory Visit (HOSPITAL_BASED_OUTPATIENT_CLINIC_OR_DEPARTMENT_OTHER): Payer: Self-pay

## 2022-12-01 ENCOUNTER — Ambulatory Visit: Payer: Self-pay | Admitting: Physician Assistant

## 2022-12-05 NOTE — Telephone Encounter (Signed)
Can we appeal her PA, she has been on it and it was previously covered.

## 2022-12-08 ENCOUNTER — Other Ambulatory Visit (HOSPITAL_BASED_OUTPATIENT_CLINIC_OR_DEPARTMENT_OTHER): Payer: Self-pay

## 2022-12-17 ENCOUNTER — Other Ambulatory Visit (HOSPITAL_BASED_OUTPATIENT_CLINIC_OR_DEPARTMENT_OTHER): Payer: Self-pay

## 2022-12-18 ENCOUNTER — Ambulatory Visit: Payer: Managed Care, Other (non HMO) | Admitting: Physician Assistant

## 2022-12-18 ENCOUNTER — Encounter: Payer: Self-pay | Admitting: Physician Assistant

## 2022-12-18 ENCOUNTER — Other Ambulatory Visit (HOSPITAL_BASED_OUTPATIENT_CLINIC_OR_DEPARTMENT_OTHER): Payer: Self-pay

## 2022-12-18 VITALS — BP 100/60 | HR 68 | Temp 97.3°F | Ht 60.0 in | Wt 167.0 lb

## 2022-12-18 DIAGNOSIS — Z0000 Encounter for general adult medical examination without abnormal findings: Secondary | ICD-10-CM | POA: Diagnosis not present

## 2022-12-18 DIAGNOSIS — E669 Obesity, unspecified: Secondary | ICD-10-CM

## 2022-12-18 DIAGNOSIS — R21 Rash and other nonspecific skin eruption: Secondary | ICD-10-CM

## 2022-12-18 DIAGNOSIS — Z85828 Personal history of other malignant neoplasm of skin: Secondary | ICD-10-CM

## 2022-12-18 DIAGNOSIS — E039 Hypothyroidism, unspecified: Secondary | ICD-10-CM

## 2022-12-18 DIAGNOSIS — F419 Anxiety disorder, unspecified: Secondary | ICD-10-CM

## 2022-12-18 DIAGNOSIS — E88819 Insulin resistance, unspecified: Secondary | ICD-10-CM

## 2022-12-18 MED ORDER — LEVOTHYROXINE SODIUM 88 MCG PO TABS
88.0000 ug | ORAL_TABLET | Freq: Every morning | ORAL | 1 refills | Status: DC
  Filled 2022-12-18: qty 30, 30d supply, fill #0
  Filled 2023-01-27: qty 30, 30d supply, fill #1
  Filled 2023-02-25: qty 30, 30d supply, fill #2
  Filled 2023-05-04: qty 30, 30d supply, fill #3
  Filled 2023-06-15: qty 30, 30d supply, fill #4
  Filled 2023-07-23: qty 30, 30d supply, fill #5

## 2022-12-18 MED ORDER — LEVOTHYROXINE SODIUM 88 MCG PO TABS
88.0000 ug | ORAL_TABLET | Freq: Every morning | ORAL | 0 refills | Status: DC
  Filled 2023-03-24: qty 30, 30d supply, fill #0

## 2022-12-18 NOTE — Progress Notes (Signed)
Subjective:    Katherine Marshall is a 37 y.o. female and is here to establish care.  HPI  Health Maintenance Due  Topic Date Due   Hepatitis C Screening  Never done    Acute Concerns: None  Chronic Issues: Hypothyroidism:  She has been dealing with hypothyroidism since 2020. She follows up with gynecology for this Was seeing endo but hers left the practice  PCOS/Insulin Resistance In 10/2021 she started Ozempic after being about 200 lbs after pregnancy.  She later switched to [facility] but insurance did not approve this She has lost about 40 lbs.  Her target weight is 140 lbs.  Anxiety Currently taking prozac 40 mg daily  Health Maintenance: Immunizations -- UTD Colonoscopy -- n/a Mammogram -- n/a PAP -- Last on 06/17/2022. Results were normal. Bone Density -- n/a Diet -- She has a light breakfast. Lunch is heaviest meal. Dinner is lighter than lunch. She avoids carbs and tends to have them mainly for breakfast.  Exercise -- She is participating in [employer].  Sleep habits -- no major issues Mood -- She had been dealing with many life stressors and was prescribed Prozac.  UTD with dentist? - yes UTD with eye doctor? - yes  Weight history: Wt Readings from Last 10 Encounters:  12/18/22 167 lb (75.8 kg)  06/17/22 176 lb (79.8 kg)  05/15/21 234 lb 12.6 oz (106.5 kg)  08/21/19 203 lb (92.1 kg)  08/15/19 200 lb (90.7 kg)  10/04/16 218 lb (98.9 kg)  07/18/16 203 lb (92.1 kg)  03/23/15 176 lb 9.4 oz (80.1 kg)  04/17/14 216 lb (98 kg)  04/15/14 216 lb 1.6 oz (98 kg)   Body mass index is 32.61 kg/m. Patient's last menstrual period was 12/14/2022 (exact date).  Alcohol use:  reports current alcohol use of about 1.0 - 2.0 standard drink of alcohol per week.  Tobacco use:  Tobacco Use: Low Risk  (12/18/2022)   Patient History    Smoking Tobacco Use: Never    Smokeless Tobacco Use: Never    Passive Exposure: Not on file   Eligible for lung cancer screening? no      12/18/2022    9:26 AM  Depression screen PHQ 2/9  Decreased Interest 0  Down, Depressed, Hopeless 0  PHQ - 2 Score 0     Other providers/specialists: Patient Care Team: Inda Coke, [place] as PCP - General (Physician Assistant)    PMHx, SurgHx, SocialHx, Medications, and Allergies were reviewed in the Visit Navigator and updated as appropriate.   Past Medical History:  Diagnosis Date   Anxiety    GERD (gastroesophageal reflux disease)    Gestational thrombocytopenia without hemorrhage ([place]) 10/06/2016   Hypertension    PIH with last preg   Hypothyroidism    IBS (irritable bowel syndrome)    Kidney stones    Migraines    as a child   Pregnancy induced hypertension    Preterm delivery 2020   Vaginal delivery    20015, 2017, 2022     Past Surgical History:  Procedure Laterality Date   BASAL CELL CARCINOMA EXCISION     DENTAL SURGERY       Family History  Problem Relation Age of Onset   Hypertension Mother    Cancer Father        thyroid   Hypertension Father    Hypertension Brother    Hypertension Maternal Grandmother    Heart attack Maternal Grandmother    Hypertension Maternal Grandfather  Heart attack Maternal Grandfather    Cancer Paternal Grandmother 13       colon   Hypertension Paternal Grandmother    Ulcerative colitis Paternal Grandmother    Cancer Paternal Grandfather        brain   Hypertension Paternal Grandfather    Diabetes Maternal Uncle    Hearing loss Neg Hx    Breast cancer Neg Hx     Social History   Tobacco Use   Smoking status: Never   Smokeless tobacco: Never  Vaping Use   Vaping Use: Never used  Substance Use Topics   Alcohol use: Yes    Alcohol/week: 1.0 - 2.0 standard drink of alcohol    Types: 1 - 2 Standard drinks or equivalent per week    Comment: occ glass of wine   Drug use: No    Review of Systems:   ROS  Objective:   BP 100/60 (BP Location: Left Arm, Patient Position: Sitting, Cuff Size: Large)    Pulse 68   Temp (!) 97.3 F (36.3 C) (Temporal)   Ht 5' (1.524 m)   Wt 167 lb (75.8 kg)   LMP 12/14/2022 (Exact Date)   SpO2 97%   Breastfeeding No   BMI 32.61 kg/m  Body mass index is 32.61 kg/m.   General Appearance:    Alert, cooperative, no distress, appears stated age  Head:    Normocephalic, without obvious abnormality, atraumatic  Eyes:    PERRL, conjunctiva/corneas clear, EOM's intact, fundi    benign, both eyes  Ears:    Normal TM's and external ear canals, both ears  Nose:   Nares normal, septum midline, mucosa normal, no drainage    or sinus tenderness  Throat:   Lips, mucosa, and tongue normal; teeth and gums normal  Neck:   Supple, symmetrical, trachea midline, no adenopathy;    thyroid:  no enlargement/tenderness/nodules; no carotid   bruit or JVD  Back:     Symmetric, no curvature, ROM normal, no CVA tenderness  Lungs:     Clear to auscultation bilaterally, respirations unlabored  Chest Wall:    No tenderness or deformity   Heart:    Regular rate and rhythm, S1 and S2 normal, no murmur, rub or gallop  Breast Exam:    Deferred  Abdomen:     Soft, non-tender, bowel sounds active all four quadrants,    no masses, no organomegaly  Genitalia:    Deferred  Extremities:   Extremities normal, atraumatic, no cyanosis or edema  Pulses:   2+ and symmetric all extremities  Skin:   Skin color, texture, turgor normal Two areas of hyperpigmentation to central sternal area and under left breast  Lymph nodes:   Cervical, supraclavicular, and axillary nodes normal  Neurologic:   CNII-XII intact, normal strength, sensation and reflexes    throughout    Assessment/Plan:   Routine physical examination Today patient counseled on age appropriate routine health concerns for screening and prevention, each reviewed and up to date or declined. Immunizations reviewed and up to date or declined. Labs deferred- she had this done with ob 6 months ago. Risk factors for depression reviewed  and negative. Hearing function and visual acuity are intact. ADLs screened and addressed as needed. Functional ability and level of safety reviewed and appropriate. Education, counseling and referrals performed based on assessed risks today. Patient provided with a copy of personalized plan for preventive services.  Hypothyroidism, unspecified type Mgmt per ob Continue levothyroxine 88 mcg daily Follow-up  per gyn recommendations  Anxiety Well controlled Continue prozac 40 mg daily Follow-up with gyn as recommended  Obesity, unspecified classification, unspecified obesity type, unspecified whether serious comorbidity present; Insulin resistance Continue healthy lifestyle efforts May consider metformin in the future May consider GLP-1 but needs to confirm type of thyroid cancer in father first  History of basal cell carcinoma ([place]) Referral to dermatology  Rash Possible contact dermatitis Trial OTC hydrocortisone Follow-up with derm if persists  I,Rachel Rivera,acting as a scribe for [employer], PA.,have documented all relevant documentation on the behalf of Inda Coke, PA,as directed by  Inda Coke, PA while in the presence of Inda Coke, [place].  I, Inda Coke, [place], have reviewed all documentation for this visit. The documentation on 12/18/22 for the exam, diagnosis, procedures, and orders are all accurate and complete.   Inda Coke, PA-C [place]

## 2022-12-18 NOTE — Patient Instructions (Signed)
It was great to see you!  Trial the hydrocortisone ointment (over the counter) for your rash Follow-up with derm if no better  Keep up the good work!  Take care,  Aldona Bar

## 2022-12-26 ENCOUNTER — Other Ambulatory Visit (HOSPITAL_BASED_OUTPATIENT_CLINIC_OR_DEPARTMENT_OTHER): Payer: Self-pay

## 2022-12-30 ENCOUNTER — Telehealth: Payer: Self-pay

## 2022-12-30 NOTE — Telephone Encounter (Signed)
[employer] sent information that the appeal request was upheld to deny coverage for [facility] 7.'5mg'$ .  Message was sent to patient with this information.  Appeal copy was labeled and sent to be scanned.

## 2023-01-27 ENCOUNTER — Other Ambulatory Visit (HOSPITAL_BASED_OUTPATIENT_CLINIC_OR_DEPARTMENT_OTHER): Payer: Self-pay

## 2023-02-25 ENCOUNTER — Other Ambulatory Visit (HOSPITAL_BASED_OUTPATIENT_CLINIC_OR_DEPARTMENT_OTHER): Payer: Self-pay

## 2023-03-24 ENCOUNTER — Other Ambulatory Visit (HOSPITAL_BASED_OUTPATIENT_CLINIC_OR_DEPARTMENT_OTHER): Payer: Self-pay

## 2023-04-16 ENCOUNTER — Other Ambulatory Visit (HOSPITAL_BASED_OUTPATIENT_CLINIC_OR_DEPARTMENT_OTHER): Payer: Self-pay

## 2023-04-17 ENCOUNTER — Other Ambulatory Visit (HOSPITAL_BASED_OUTPATIENT_CLINIC_OR_DEPARTMENT_OTHER): Payer: Self-pay

## 2023-04-21 ENCOUNTER — Ambulatory Visit: Payer: Managed Care, Other (non HMO) | Admitting: Physician Assistant

## 2023-04-22 ENCOUNTER — Telehealth: Payer: Self-pay | Admitting: Dermatology

## 2023-04-22 NOTE — Telephone Encounter (Signed)
Patient appointment would need to be reschedule due to the provider being out of office.

## 2023-05-08 ENCOUNTER — Other Ambulatory Visit (HOSPITAL_BASED_OUTPATIENT_CLINIC_OR_DEPARTMENT_OTHER): Payer: Self-pay

## 2023-05-20 ENCOUNTER — Ambulatory Visit: Payer: Managed Care, Other (non HMO) | Admitting: Dermatology

## 2023-06-09 ENCOUNTER — Encounter: Payer: Self-pay | Admitting: Physician Assistant

## 2023-06-09 ENCOUNTER — Other Ambulatory Visit: Payer: Self-pay | Admitting: Physician Assistant

## 2023-06-09 DIAGNOSIS — Z800 Family history of malignant neoplasm of digestive organs: Secondary | ICD-10-CM

## 2023-06-15 ENCOUNTER — Other Ambulatory Visit: Payer: Self-pay | Admitting: Radiology

## 2023-06-16 ENCOUNTER — Ambulatory Visit: Payer: Managed Care, Other (non HMO) | Admitting: Internal Medicine

## 2023-06-16 ENCOUNTER — Encounter: Payer: Self-pay | Admitting: Internal Medicine

## 2023-06-16 ENCOUNTER — Other Ambulatory Visit (HOSPITAL_BASED_OUTPATIENT_CLINIC_OR_DEPARTMENT_OTHER): Payer: Self-pay

## 2023-06-16 ENCOUNTER — Other Ambulatory Visit: Payer: Self-pay

## 2023-06-16 VITALS — BP 120/84 | HR 71 | Temp 98.3°F | Ht 60.0 in | Wt 167.6 lb

## 2023-06-16 DIAGNOSIS — R110 Nausea: Secondary | ICD-10-CM

## 2023-06-16 DIAGNOSIS — R5383 Other fatigue: Secondary | ICD-10-CM

## 2023-06-16 DIAGNOSIS — R1033 Periumbilical pain: Secondary | ICD-10-CM | POA: Diagnosis not present

## 2023-06-16 LAB — POC URINALSYSI DIPSTICK (AUTOMATED)
Bilirubin, UA: NEGATIVE
Blood, UA: NEGATIVE
Glucose, UA: NEGATIVE
Ketones, UA: NEGATIVE
Nitrite, UA: NEGATIVE
Protein, UA: NEGATIVE
Spec Grav, UA: 1.01 (ref 1.010–1.025)
Urobilinogen, UA: 0.2 E.U./dL
pH, UA: 7.5 (ref 5.0–8.0)

## 2023-06-16 LAB — COMPREHENSIVE METABOLIC PANEL
ALT: 9 U/L (ref 0–35)
AST: 15 U/L (ref 0–37)
Albumin: 3.8 g/dL (ref 3.5–5.2)
Alkaline Phosphatase: 42 U/L (ref 39–117)
BUN: 17 mg/dL (ref 6–23)
CO2: 27 mEq/L (ref 19–32)
Calcium: 8.9 mg/dL (ref 8.4–10.5)
Chloride: 101 mEq/L (ref 96–112)
Creatinine, Ser: 0.89 mg/dL (ref 0.40–1.20)
GFR: 82.03 mL/min (ref >60.00)
Glucose, Bld: 75 mg/dL (ref 70–99)
Potassium: 4.5 mEq/L (ref 3.5–5.1)
Sodium: 135 mEq/L (ref 135–145)
Total Bilirubin: 0.5 mg/dL (ref 0.2–1.2)
Total Protein: 6.6 g/dL (ref 6.0–8.3)

## 2023-06-16 LAB — CBC WITH DIFFERENTIAL/PLATELET
Basophils Absolute: 0 10*3/uL (ref 0.0–0.1)
Basophils Relative: 0.3 % (ref 0.0–3.0)
Eosinophils Absolute: 0 10*3/uL (ref 0.0–0.7)
Eosinophils Relative: 0.4 % (ref 0.0–5.0)
HCT: 42 % (ref 36.0–46.0)
Hemoglobin: 13.6 g/dL (ref 12.0–15.0)
Lymphocytes Relative: 26.4 % (ref 12.0–46.0)
Lymphs Abs: 1.9 10*3/uL (ref 0.7–4.0)
MCHC: 32.3 g/dL (ref 30.0–36.0)
MCV: 93.9 fl (ref 78.0–100.0)
Monocytes Absolute: 0.5 10*3/uL (ref 0.1–1.0)
Monocytes Relative: 7.1 % (ref 3.0–12.0)
Neutro Abs: 4.8 10*3/uL (ref 1.4–7.7)
Neutrophils Relative %: 65.8 % (ref 43.0–77.0)
Platelets: 155 10*3/uL (ref 150.0–400.0)
RBC: 4.47 Mil/uL (ref 3.87–5.11)
RDW: 12.6 % (ref 11.5–15.5)
WBC: 7.3 10*3/uL (ref 4.0–10.5)

## 2023-06-16 LAB — LIPASE: Lipase: 26 U/L (ref 11.0–59.0)

## 2023-06-16 LAB — POC COVID19 BINAXNOW: SARS Coronavirus 2 Ag: NEGATIVE

## 2023-06-16 LAB — POCT MONO (EPSTEIN BARR VIRUS): Mono, POC: NEGATIVE

## 2023-06-16 LAB — POCT INFLUENZA A/B
Influenza A, POC: NEGATIVE
Influenza B, POC: NEGATIVE

## 2023-06-16 LAB — POCT URINE PREGNANCY: Preg Test, Ur: NEGATIVE

## 2023-06-16 MED ORDER — SUCRALFATE 1 GM/10ML PO SUSP
1.0000 g | Freq: Three times a day (TID) | ORAL | 0 refills | Status: DC
Start: 2023-06-16 — End: 2024-01-05
  Filled 2023-06-16 (×2): qty 420, 11d supply, fill #0

## 2023-06-16 MED ORDER — FLUOXETINE HCL 40 MG PO CAPS
40.0000 mg | ORAL_CAPSULE | Freq: Every day | ORAL | 0 refills | Status: DC
  Filled 2023-06-16: qty 30, 30d supply, fill #0
  Filled 2023-07-23: qty 30, 30d supply, fill #1
  Filled 2023-09-01: qty 30, 30d supply, fill #2

## 2023-06-16 MED ORDER — ONDANSETRON 4 MG PO TBDP
4.0000 mg | ORAL_TABLET | Freq: Three times a day (TID) | ORAL | 0 refills | Status: DC | PRN
Start: 2023-06-16 — End: 2024-02-29
  Filled 2023-06-16: qty 9, 3d supply, fill #0
  Filled 2023-06-16: qty 20, 7d supply, fill #0

## 2023-06-16 MED ORDER — OMEPRAZOLE 40 MG PO CPDR
40.0000 mg | DELAYED_RELEASE_CAPSULE | Freq: Every day | ORAL | 2 refills | Status: DC
Start: 2023-06-16 — End: 2024-01-05
  Filled 2023-06-16 (×3): qty 30, 30d supply, fill #0

## 2023-06-16 NOTE — Telephone Encounter (Signed)
Medication refill request: prozac 40mg  Last AEX:  06-17-22 Next AEX: 08-20-23 Last MMG (if hormonal medication request): n/a Refill authorized: please approve if appropriate

## 2023-06-16 NOTE — Progress Notes (Signed)
[facility] [address] [place] [address] Dept: [phone] Dept Fax: [phone]  Acute Care Office Visit  Subjective:   Katherine Marshall 1986-08-05 06/16/2023  Chief Complaint  Patient presents with   Nausea   Diarrhea    Started 2 weeks and taking peto bismol     HPI: Katherine Marshall is a 37 yo F who complains of nausea and "stomach constantly churning" onset approximately 2 weeks ago. Patient states her abdomen feels like she constantly has to have a bowel movement. Abdominal pain located in mid abdomen near umbilicus, and is made worse with eating, usually occurs 30 minutes after eating. She has had regular BM's, most recent BM 1 day ago. She also reports associated generalized fatigue and sleepiness, especially this past weekend. Also reports bloating sensation. She did have a sore throat this weekend, she was outside all day. She did take some allergy medication which resolved her sore throat.  Denies: fever, chills, vomiting, dysuria, constipation   Did take 2 home COVID test recently which were negative.  Has been taking pepto bismol , ibuprofen.  LMP: 05/24/23.   She has been under an increased stress due to mother being recently diagnosed with Stage 4 pancreatic cancer and patient has stepped into roll of primary care giver. However, she reports she does not feel stressed.     The following portions of the patient's history were reviewed and updated as appropriate: past medical history, past surgical history, family history, social history, allergies, medications, and problem list.   Patient Active Problem List   Diagnosis Date Noted   Insulin resistance 12/18/2022   Hypertension in pregnancy, preeclampsia, severe, delivered 05/14/2021   Hypothyroidism 05/14/2021   Anxiety 05/14/2021   Calculus of kidney 02/18/2016   Past Medical History:  Diagnosis Date   Anxiety    GERD (gastroesophageal reflux disease)     Gestational thrombocytopenia without hemorrhage ([facility]) 10/06/2016   Hypertension    PIH with last preg   Hypothyroidism    IBS (irritable bowel syndrome)    Kidney stones    Migraines    as a child   Pregnancy induced hypertension    Preterm delivery 2020   Vaginal delivery    20015, 2017, 2022   Past Surgical History:  Procedure Laterality Date   BASAL CELL CARCINOMA EXCISION     DENTAL SURGERY     Family History  Problem Relation Age of Onset   Hypertension Mother    Pancreatic cancer Mother    Cancer Father        thyroid   Hypertension Father    Hypertension Brother    Hypertension Maternal Grandmother    Heart attack Maternal Grandmother    Hypertension Maternal Grandfather    Heart attack Maternal Grandfather    Cancer Paternal Grandmother 75       colon   Hypertension Paternal Grandmother    Ulcerative colitis Paternal Grandmother    Cancer Paternal Grandfather        brain   Hypertension Paternal Grandfather    Diabetes Maternal Uncle    Hearing loss Neg Hx    Breast cancer Neg Hx     Current Outpatient Medications:    acetaminophen (TYLENOL) 325 MG tablet, Take 2 tablets (650 mg total) by mouth every 4 (four) hours as needed (for pain scale < 4)., Disp: 30 tablet, Rfl: 1   FLUoxetine (PROZAC) 40 MG capsule, Take 1 capsule (40 mg total) by mouth daily., Disp:  90 capsule, Rfl: 0   ibuprofen (ADVIL) 600 MG tablet, Take 1 tablet (600 mg total) by mouth every 6 (six) hours., Disp: 30 tablet, Rfl: 0   levothyroxine (SYNTHROID) 88 MCG tablet, Take 1 tablet (88 mcg total) by mouth every morning on an empty stomach., Disp: 90 tablet, Rfl: 1   LORazepam (ATIVAN) 0.5 MG tablet, Take 1 tablet orally daily Take as needed, Disp: 30 tablet, Rfl: 0   omeprazole (PRILOSEC) 40 MG capsule, Take 1 capsule (40 mg total) by mouth daily., Disp: 30 capsule, Rfl: 2   ondansetron (ZOFRAN-ODT) 4 MG disintegrating tablet, Take 1 tablet (4 mg total) by mouth every 8 (eight) hours as  needed for nausea or vomiting., Disp: 20 tablet, Rfl: 0   sucralfate (CARAFATE) 1 GM/10ML suspension, Take 10 mLs (1 g total) by mouth 4 (four) times daily -  with meals and at bedtime., Disp: 420 mL, Rfl: 0   levothyroxine (SYNTHROID) 88 MCG tablet, Take 1 tablet (88 mcg total) by mouth in the morning on an empty stomach., Disp: 30 tablet, Rfl: 0 No Known Allergies   ROS: A complete ROS was performed with pertinent positives/negatives noted in the HPI. The remainder of the ROS are negative.    Objective:   Today's Vitals   06/16/23 1057  BP: 120/84  Pulse: 71  Temp: 98.3 F (36.8 C)  TempSrc: Temporal  SpO2: 99%  Weight: 167 lb 9.6 oz (76 kg)  Height: 5' (1.524 m)    GENERAL: Well-appearing, in NAD. Well nourished.  SKIN: Pink, warm and dry. No rash, lesion, ulceration, or ecchymoses.  HEENT:    HEAD: Normocephalic, non-traumatic.  EYES: Conjunctive pink without exudate. PERRL, EOMI.  NOSE: Septum midline w/o deformity. Nares patent, mucosa pink and non-inflamed w/o drainage.  THROAT: Uvula midline. Oropharynx clear. Tonsils non-inflamed w/o exudate. Mucus membranes pink and moist.  NECK: Trachea midline. Full ROM w/o pain or tenderness. No lymphadenopathy.  RESPIRATORY: Chest wall symmetrical. Respirations even and non-labored. Breath sounds clear to auscultation bilaterally.  CARDIAC: S1, S2 present, regular rate and rhythm. Peripheral pulses 2+ bilaterally.  GI: Abdomen soft, mild tenderness to umbilical region with palpation. Normoactive bowel sounds. No rebound tenderness. No hepatomegaly or splenomegaly.  MSK: Muscle tone and strength appropriate for age. Joints w/o tenderness, redness, or swelling. EXTREMITIES: Without clubbing, cyanosis, or edema.  NEUROLOGIC: Steady, even gait.  PSYCH/MENTAL STATUS: Alert, oriented x 3. Cooperative, appropriate mood and affect.    Results for orders placed or performed in visit on 06/16/23  POCT urine pregnancy  Result Value Ref  Range   Preg Test, Ur Negative Negative  POC COVID-19 BinaxNow  Result Value Ref Range   SARS Coronavirus 2 Ag Negative Negative  POCT Influenza A/B  Result Value Ref Range   Influenza A, POC Negative Negative   Influenza B, POC Negative Negative  POCT Mono (Epstein Barr Virus)  Result Value Ref Range   Mono, POC Negative Negative  POCT Urinalysis Dipstick (Automated)  Result Value Ref Range   Color, UA yellow    Clarity, UA clear    Glucose, UA Negative Negative   Bilirubin, UA neg    Ketones, UA neg    Spec Grav, UA 1.010 1.010 - 1.025   Blood, UA neg    pH, UA 7.5 5.0 - 8.0   Protein, UA Negative Negative   Urobilinogen, UA 0.2 0.2 or 1.0 E.U./dL   Nitrite, UA neg    Leukocytes, UA Small (1+) Negative  Assessment & Plan:  1. Periumbilical abdominal pain - CBC with Differential/Platelet - Comp Met (CMET) - Lipase - H. pylori antigen, stool; Future - sucralfate (CARAFATE) 1 GM/10ML suspension; Take 10 mLs (1 g total) by mouth 4 (four) times daily -  with meals and at bedtime.  Dispense: 420 mL; Refill: 0 - omeprazole (PRILOSEC) 40 MG capsule; Take 1 capsule (40 mg total) by mouth daily.  Dispense: 30 capsule; Refill: 2 - if no improvement despite medications, and a negative h. Pylori test, patient will notify provider and will do imaging of abdomen for further workup at that time.   2. Nausea - POCT urine pregnancy - POCT Urinalysis Dipstick (Automated) - ondansetron (ZOFRAN-ODT) 4 MG disintegrating tablet; Take 1 tablet (4 mg total) by mouth every 8 (eight) hours as needed for nausea or vomiting.  Dispense: 20 tablet; Refill: 0  3. Other fatigue - POC COVID-19 BinaxNow - POCT Influenza A/B - POCT Mono (Epstein Barr Virus) - POCT Urinalysis Dipstick (Automated)  Meds ordered this encounter  Medications   sucralfate (CARAFATE) 1 GM/10ML suspension    Sig: Take 10 mLs (1 g total) by mouth 4 (four) times daily -  with meals and at bedtime.    Dispense:  420 mL     Refill:  0    Order Specific Question:   Supervising Provider    Answer:   Garnette Gunner [[id number]]   omeprazole (PRILOSEC) 40 MG capsule    Sig: Take 1 capsule (40 mg total) by mouth daily.    Dispense:  30 capsule    Refill:  2    Order Specific Question:   Supervising Provider    Answer:   Garnette Gunner [[id number]]   ondansetron (ZOFRAN-ODT) 4 MG disintegrating tablet    Sig: Take 1 tablet (4 mg total) by mouth every 8 (eight) hours as needed for nausea or vomiting.    Dispense:  20 tablet    Refill:  0    Order Specific Question:   Supervising Provider    Answer:   Garnette Gunner [[id number]]   Orders Placed This Encounter  Procedures   CBC with Differential/Platelet   Comp Met (CMET)   Lipase   H. pylori antigen, stool    Standing Status:   Future    Standing Expiration Date:   06/15/2024   POCT urine pregnancy   POC COVID-19 BinaxNow    Order Specific Question:   Previously tested for COVID-19    Answer:   Yes    Order Specific Question:   Resident in a congregate (group) care setting    Answer:   No    Order Specific Question:   Employed in healthcare setting    Answer:   Unknown    Order Specific Question:   Pregnant    Answer:   No   POCT Influenza A/B   POCT Mono (Epstein Barr Virus)   POCT Urinalysis Dipstick (Automated)   Lab Orders         CBC with Differential/Platelet         Comp Met (CMET)         Lipase         H. pylori antigen, stool         POCT urine pregnancy         POC COVID-19 BinaxNow         POCT Influenza A/B         POCT Mono (Epstein Barr Virus)  POCT Urinalysis Dipstick (Automated)     No images are attached to the encounter or orders placed in the encounter.  Return if symptoms worsen or fail to improve.   Salvatore Decent, FNP

## 2023-06-16 NOTE — Patient Instructions (Signed)
Return stool specimen for testing

## 2023-06-18 ENCOUNTER — Other Ambulatory Visit (HOSPITAL_BASED_OUTPATIENT_CLINIC_OR_DEPARTMENT_OTHER): Payer: Self-pay

## 2023-06-23 ENCOUNTER — Telehealth: Payer: Self-pay | Admitting: Genetic Counselor

## 2023-06-23 ENCOUNTER — Ambulatory Visit: Payer: Managed Care, Other (non HMO) | Admitting: Radiology

## 2023-06-23 NOTE — Telephone Encounter (Signed)
 Left patient a message regarding scheduled appointment times/dates for [facility]

## 2023-07-13 ENCOUNTER — Encounter: Payer: Self-pay | Admitting: Genetic Counselor

## 2023-07-14 ENCOUNTER — Other Ambulatory Visit: Payer: Self-pay | Admitting: Genetic Counselor

## 2023-07-14 ENCOUNTER — Inpatient Hospital Stay: Payer: Managed Care, Other (non HMO) | Attending: Genetic Counselor | Admitting: Genetic Counselor

## 2023-07-14 ENCOUNTER — Inpatient Hospital Stay: Payer: Managed Care, Other (non HMO)

## 2023-07-14 ENCOUNTER — Encounter: Payer: Self-pay | Admitting: Genetic Counselor

## 2023-07-14 DIAGNOSIS — Z808 Family history of malignant neoplasm of other organs or systems: Secondary | ICD-10-CM | POA: Diagnosis not present

## 2023-07-14 DIAGNOSIS — Z803 Family history of malignant neoplasm of breast: Secondary | ICD-10-CM

## 2023-07-14 DIAGNOSIS — Z800 Family history of malignant neoplasm of digestive organs: Secondary | ICD-10-CM

## 2023-07-14 LAB — GENETIC SCREENING ORDER

## 2023-07-14 NOTE — Progress Notes (Signed)
REFERRING PROVIDER: Jarold Motto, PA [address] [place],  [place] [address]  PRIMARY PROVIDER:  Jarold Motto, [place]  PRIMARY REASON FOR VISIT:  1. Family history of pancreatic cancer   2. Family history of breast cancer   3. Family history of melanoma   4. Family history of brain cancer      HISTORY OF PRESENT ILLNESS:   Katherine Marshall, a 37 y.o. female, was seen for a [facility] cancer genetics consultation at the request of Jarold Motto, PA-C due to a family history of cancer.  Katherine Marshall presents to clinic today to discuss the possibility of a hereditary predisposition to cancer, genetic testing, and to further clarify her future cancer risks, as well as potential cancer risks for family members.   Katherine Marshall is a 37 y.o. female with no personal history of cancer.    CANCER HISTORY:  Oncology History   No history exists.     Past Medical History:  Diagnosis Date   Anxiety    Family history of brain cancer    Family history of breast cancer    Family history of melanoma    Family history of pancreatic cancer    GERD (gastroesophageal reflux disease)    Gestational thrombocytopenia without hemorrhage (HCC) 10/06/2016   Hypertension    PIH with last preg   Hypothyroidism    IBS (irritable bowel syndrome)    Kidney stones    Migraines    as a child   Pregnancy induced hypertension    Preterm delivery 2020   Vaginal delivery    20015, 2017, 2022    Past Surgical History:  Procedure Laterality Date   BASAL CELL CARCINOMA EXCISION     DENTAL SURGERY      Social History   Socioeconomic History   Marital status: Married    Spouse name: Not on file   Number of children: Not on file   Years of education: Not on file   Highest education level: Not on file  Occupational History   Not on file  Tobacco Use   Smoking status: Never   Smokeless tobacco: Never  Vaping Use   Vaping status: Never Used  Substance and Sexual Activity   Alcohol use: Yes     Alcohol/week: 1.0 - 2.0 standard drink of alcohol    Types: 1 - 2 Standard drinks or equivalent per week    Comment: occ glass of wine   Drug use: No   Sexual activity: Yes    Partners: Male    Birth control/protection: Surgical    Comment: partner vasectomy  Other Topics Concern   Not on file  Social History Narrative   Investment banker, corporate   Husband in [employer] Police Officer   3 kids   Social Determinants of Health   Financial Resource Strain: Not on file  Food Insecurity: Not on file  Transportation Needs: Not on file  Physical Activity: Not on file  Stress: Not on file  Social Connections: Not on file     FAMILY HISTORY:  We obtained a detailed, 4-generation family history.  Significant diagnoses are listed below: Family History  Problem Relation Age of Onset   Hypertension Mother    Pancreatic cancer Mother 72   Thyroid cancer Father    Hypertension Father    Hypertension Brother    Diabetes Maternal Uncle    Non-Hodgkin's lymphoma Maternal Uncle 4   Melanoma Maternal Uncle    Cancer Maternal Uncle  sarcoma   Melanoma Paternal Aunt    Hypertension Maternal Grandmother    Heart attack Maternal Grandmother    Heart attack Maternal Grandfather    Colon cancer Paternal Grandmother 11   Hypertension Paternal Grandmother    Ulcerative colitis Paternal Grandmother    Brain cancer Paternal Grandfather 47       glioblastoma   Hypertension Paternal Grandfather    Melanoma Other        PGF's sister   Breast cancer Other        MGM's two sisters   Breast cancer Other        MGF's mother   Hearing loss Neg Hx      The patient has three children who are cancer free.  She ha one brother who is cancer free.  Both parents are living.  The patient's mother was diagnosed with pancreatic cancer at 46.  She was seen for genetic counseling at [facility] but declined testing.  She had one brother who had melanoma, NHL, and sarcoma.  The maternal grandparents are deceased.  The  grandmother had two sisters with breast cancer and the grandfather had a mother with breast cancer.  The patient's father had thyroid cancer.  He had three sisters, one who had melanoma at 36.  The paternal grandparents are deceased.  The grandmother had colon cancer in her 73's and the grandfather had glioblastoma.  He has a sister who had a melanoma.  Katherine Marshall is unaware of previous family history of genetic testing for hereditary cancer risks.  There is no reported Ashkenazi Jewish ancestry. There is no known consanguinity.  GENETIC COUNSELING ASSESSMENT: Katherine Marshall is a 37 y.o. female with a family history of cancer which is somewhat suggestive of a hereditary cancer syndrome and predisposition to cancer given the combination of cancer on both sides of the family. We, therefore, discussed and recommended the following at today's visit.   DISCUSSION: We discussed that, in general, most cancer is not inherited in families, but instead is sporadic or familial. Sporadic cancers occur by chance and typically happen at older ages (>50 years) as this type of cancer is caused by genetic changes acquired during an individual's lifetime. Some families have more cancers than would be expected by chance; however, the ages or types of cancer are not consistent with a known genetic mutation or known genetic mutations have been ruled out. This type of familial cancer is thought to be due to a combination of multiple genetic, environmental, hormonal, and lifestyle factors. While this combination of factors likely increases the risk of cancer, the exact source of this risk is not currently identifiable or testable.  We discussed that 5 - 10% of melanoma is hereditary, with most cases associated with CDKN2A mutations.  This gene also increases the risk for pancreatic cancer and astrocytoma with is a glial brain cancer.  She has bilineal risk for CDKN2A.  There are other genes that can be associated with hereditary  melanoma cancer syndromes.  These include BAP1, POT1 and MITF.  Katherine Marshall is most concerned about the risk for pancreatic cancer.  We discussed that there are several genes associated with this cancer as well.  It would be best if her mother undergo genetic testing as this could tell us if she has a genetic mutation as well. Katherine Marshall will discuss this with her.  Katherine Marshall mentioned that there was a TP53 mutation on her mother's Guardant[employer] testing.  We discussed the difference between somatic  and germline mutations.  Based on this Guardant360 result, we are not concerned for Burgess Amor syndrome.  We discussed that testing is beneficial for several reasons including knowing how to follow individuals after completing their treatment, identifying whether potential treatment options such as PARP inhibitors would be beneficial, and understand if other family members could be at risk for cancer and allow them to undergo genetic testing.   We reviewed the characteristics, features and inheritance patterns of hereditary cancer syndromes. We also discussed genetic testing, including the appropriate family members to test, the process of testing, insurance coverage and turn-around-time for results. We discussed the implications of a negative, positive, carrier and/or variant of uncertain significant result. Katherine Marshall  was offered a common hereditary cancer panel (40+ genes) and an expanded pan-cancer panel (70+ genes). Katherine Marshall was informed of the benefits and limitations of each panel, including that expanded pan-cancer panels contain genes that do not have clear management guidelines at this point in time.  We also discussed that as the number of genes included on a panel increases, the chances of variants of uncertain significance increases. Katherine Marshall decided to pursue genetic testing for the CancerNext-Expanded+RNAinsight gene panel.   The CancerNext-Expanded gene panel offered by [facility] and includes  sequencing and rearrangement analysis for the following 77 genes: AIP, ALK, APC*, ATM*, AXIN2, BAP1, BARD1, BMPR1A, BRCA1*, BRCA2*, BRIP1*, CDC73, CDH1*, CDK4, CDKN1B, CDKN2A, CHEK2*, CTNNA1, DICER1, FH, FLCN, KIF1B, LZTR1, MAX, MEN1, MET, MLH1*, MSH2*, MSH3, MSH6*, MUTYH*, NF1*, NF2, NTHL1, PALB2*, PHOX2B, PMS2*, POT1, PRKAR1A, PTCH1, PTEN*, RAD51C*, RAD51D*, RB1, RET, SDHA, SDHAF2, SDHB, SDHC, SDHD, SMAD4, SMARCA4, SMARCB1, SMARCE1, STK11, SUFU, TMEM127, TP53*, TSC1, TSC2, and VHL (sequencing and deletion/duplication); EGFR, EGLN1, HOXB13, KIT, MITF, PDGFRA, POLD1, and POLE (sequencing only); EPCAM and GREM1 (deletion/duplication only). DNA and RNA analyses performed for * genes.   Based on Katherine Marshall family history of cancer, she meets medical criteria for genetic testing. Despite that she meets criteria, she may still have an out of pocket cost. We discussed that if her out of pocket cost for testing is over $100, the laboratory will call and confirm whether she wants to proceed with testing.  If the out of pocket cost of testing is less than $100 she will be billed by the genetic testing laboratory.   We discussed that some people do not want to undergo genetic testing due to fear of genetic discrimination.  The Genetic Information Nondiscrimination Act (GINA) was signed into federal law in 2008. GINA prohibits health insurers and most employers from discriminating against individuals based on genetic information (including the results of genetic tests and family history information). According to GINA, health insurance companies cannot consider genetic information to be a preexisting condition, nor can they use it to make decisions regarding coverage or rates. GINA also makes it illegal for most employers to use genetic information in making decisions about hiring, firing, promotion, or terms of employment. It is important to note that GINA does not offer protections for life insurance, disability  insurance, or long-term care insurance. GINA does not apply to those in the [employer], those who work for companies with less than 15 employees, and new life insurance or long-term disability insurance policies.  Health status due to a cancer diagnosis is not protected under GINA. More information about GINA can be found by visiting [email or web address].   PLAN: After considering the risks, benefits, and limitations, Katherine Marshall provided informed consent to pursue genetic testing and the blood sample was  sent to [facility] for analysis of the CancerNext-Expanded+RNAinsight. Results should be available within approximately 2-3 weeks' time, at which point they will be disclosed by telephone to Katherine Marshall, as will any additional recommendations warranted by these results. Katherine Marshall will receive a summary of her genetic counseling visit and a copy of her results once available. This information will also be available in Epic.   Based on Katherine Marshall family history, we recommended her mother, who was diagnosed with pancreatic cancer at age 81, have genetic counseling and testing. Katherine Marshall will let us know if we can be of any assistance in coordinating genetic counseling and/or testing for this family member.   Lastly, we encouraged Katherine Marshall to remain in contact with cancer genetics annually so that we can continuously update the family history and inform her of any changes in cancer genetics and testing that may be of benefit for this family.   Ms. Boesch questions were answered to her satisfaction today. Our contact information was provided should additional questions or concerns arise. Thank you for the referral and allowing [place] to share in the care of your patient.   Indianna Boran P. Lowell Guitar, MS, [facility] Licensed, Patent attorney [email or web address] phone: [phone]  The patient was seen for a total of 35 minutes in face-to-face genetic counseling.  The patient was seen alone.  Drs.  Meliton Rattan, and/or [place] were available for questions, if needed..    _______________________________________________________________________ For Office Staff:  Number of people involved in session: 1 Was an Intern/ student involved with case: no

## 2023-07-22 ENCOUNTER — Ambulatory Visit: Payer: Managed Care, Other (non HMO) | Admitting: Dermatology

## 2023-07-23 ENCOUNTER — Other Ambulatory Visit: Payer: Self-pay

## 2023-07-23 ENCOUNTER — Other Ambulatory Visit (HOSPITAL_BASED_OUTPATIENT_CLINIC_OR_DEPARTMENT_OTHER): Payer: Self-pay

## 2023-07-29 ENCOUNTER — Telehealth: Payer: Self-pay | Admitting: Genetic Counselor

## 2023-07-29 ENCOUNTER — Encounter: Payer: Self-pay | Admitting: Genetic Counselor

## 2023-07-29 DIAGNOSIS — Z1379 Encounter for other screening for genetic and chromosomal anomalies: Secondary | ICD-10-CM | POA: Insufficient documentation

## 2023-07-29 NOTE — Telephone Encounter (Signed)
Revealed negative genetic testing.  Discussed that we do not know why there is cancer in the family. It could be due to a different gene that we are not testing, or maybe our current technology may not be able to pick something up.  It will be important for her to keep in contact with genetics to keep up with whether additional testing may be needed.  

## 2023-07-30 ENCOUNTER — Ambulatory Visit: Payer: Self-pay | Admitting: Genetic Counselor

## 2023-07-30 DIAGNOSIS — Z1379 Encounter for other screening for genetic and chromosomal anomalies: Secondary | ICD-10-CM

## 2023-07-30 NOTE — Progress Notes (Signed)
HPI:  Katherine Marshall was previously seen in the [facility] due to a family history of cancer and concerns regarding a hereditary predisposition to cancer. Please refer to our prior cancer genetics clinic note for more information regarding our discussion, assessment and recommendations, at the time. Katherine Marshall recent genetic test results were disclosed to her, as were recommendations warranted by these results. These results and recommendations are discussed in more detail below.  CANCER HISTORY:  Oncology History   No history exists.    FAMILY HISTORY:  We obtained a detailed, 4-generation family history.  Significant diagnoses are listed below: Family History  Problem Relation Age of Onset   Hypertension Mother    Pancreatic cancer Mother 59   Thyroid cancer Father    Hypertension Father    Hypertension Brother    Diabetes Maternal Uncle    Non-Hodgkin's lymphoma Maternal Uncle 110   Melanoma Maternal Uncle    Cancer Maternal Uncle        sarcoma   Melanoma Paternal Aunt    Hypertension Maternal Grandmother    Heart attack Maternal Grandmother    Heart attack Maternal Grandfather    Colon cancer Paternal Grandmother 4   Hypertension Paternal Grandmother    Ulcerative colitis Paternal Grandmother    Brain cancer Paternal Grandfather 29       glioblastoma   Hypertension Paternal Grandfather    Melanoma Other        PGF's sister   Breast cancer Other        MGM's two sisters   Breast cancer Other        MGF's mother   Hearing loss Neg Hx        The patient has three children who are cancer free.  She ha one brother who is cancer free.  Both parents are living.   The patient's mother was diagnosed with pancreatic cancer at 64.  She was seen for genetic counseling at [facility] but declined testing.  She had one brother who had melanoma, NHL, and sarcoma.  The maternal grandparents are deceased.  The grandmother had two sisters with breast cancer and the  grandfather had a mother with breast cancer.   The patient's father had thyroid cancer.  He had three sisters, one who had melanoma at 44.  The paternal grandparents are deceased.  The grandmother had colon cancer in her 20's and the grandfather had glioblastoma.  He has a sister who had a melanoma.   Katherine Marshall is unaware of previous family history of genetic testing for hereditary cancer risks.  There is no reported Ashkenazi Jewish ancestry. There is no known consanguinity.  GENETIC TEST RESULTS: Genetic testing reported out on July 28, 2023 through the CancerNext-Expanded+RNAinsight cancer panel found no pathogenic mutations. The CancerNext-Expanded gene panel offered by [facility] and includes sequencing and rearrangement analysis for the following 77 genes: AIP, ALK, APC*, ATM*, AXIN2, BAP1, BARD1, BMPR1A, BRCA1*, BRCA2*, BRIP1*, CDC73, CDH1*, CDK4, CDKN1B, CDKN2A, CHEK2*, CTNNA1, DICER1, FH, FLCN, KIF1B, LZTR1, MAX, MEN1, MET, MLH1*, MSH2*, MSH3, MSH6*, MUTYH*, NF1*, NF2, NTHL1, PALB2*, PHOX2B, PMS2*, POT1, PRKAR1A, PTCH1, PTEN*, RAD51C*, RAD51D*, RB1, RET, SDHA, SDHAF2, SDHB, SDHC, SDHD, SMAD4, SMARCA4, SMARCB1, SMARCE1, STK11, SUFU, TMEM127, TP53*, TSC1, TSC2, and VHL (sequencing and deletion/duplication); EGFR, EGLN1, HOXB13, KIT, MITF, PDGFRA, POLD1, and POLE (sequencing only); EPCAM and GREM1 (deletion/duplication only). DNA and RNA analyses performed for * genes. The test report has been scanned into EPIC and is located under the Molecular Pathology  section of the Results Review tab.  A portion of the result report is included below for reference.     We discussed with Katherine Marshall that because current genetic testing is not perfect, it is possible there may be a gene mutation in one of these genes that current testing cannot detect, but that chance is small.  We also discussed, that there could be another gene that has not yet been discovered, or that we have not yet tested, that is  responsible for the cancer diagnoses in the family. It is also possible there is a hereditary cause for the cancer in the family that Katherine Marshall did not inherit and therefore was not identified in her testing.  Therefore, it is important to remain in touch with cancer genetics in the future so that we can continue to offer Katherine Marshall the most up to date genetic testing.   ADDITIONAL GENETIC TESTING: We discussed with Katherine Marshall that her genetic testing was fairly extensive.  If there are genes identified to increase cancer risk that can be analyzed in the future, we would be happy to discuss and coordinate this testing at that time.    CANCER SCREENING RECOMMENDATIONS: Katherine Marshall test result is considered negative (normal).  This means that we have not identified a hereditary cause for her family history of cancer at this time. Most cancers happen by chance and this negative test suggests that her family history of cancer may fall into this category.    Possible reasons for Katherine Marshall negative genetic test include:  1. There may be a gene mutation in one of these genes that current testing methods cannot detect but that chance is small.  2. There could be another gene that has not yet been discovered, or that we have not yet tested, that is responsible for the cancer diagnoses in the family.  3.  There may be no hereditary risk for cancer in the family. The cancers in Katherine Marshall and/or her family may be sporadic/familial or due to other genetic and environmental factors. 4. It is also possible there is a hereditary cause for the cancer in the family that Katherine Marshall did not inherit.  Therefore, it is recommended she continue to follow the cancer management and screening guidelines provided by her primary healthcare provider. An individual's cancer risk and medical management are not determined by genetic test results alone. Overall cancer risk assessment incorporates additional factors, including personal  medical history, family history, and any available genetic information that may result in a personalized plan for cancer prevention and surveillance  RECOMMENDATIONS FOR FAMILY MEMBERS:  Individuals in this family might be at some increased risk of developing cancer, over the general population risk, simply due to the family history of cancer.  We recommended women in this family have a yearly mammogram beginning at age 17, or 50 years younger than the earliest onset of cancer, an annual clinical breast exam, and perform monthly breast self-exams. Women in this family should also have a gynecological exam as recommended by their primary provider. All family members should be referred for colonoscopy starting at age 51.  It is also possible there is a hereditary cause for the cancer in Katherine Marshall family that she did not inherit and therefore was not identified in her.  Based on Katherine Marshall family history, we recommended her mother, who was diagnosed with pancreatic cancer, have genetic counseling and testing. We are in the process of trying to get Katherine Marshall  mother tested.   FOLLOW-UP: Lastly, we discussed with Katherine Marshall that cancer genetics is a rapidly advancing field and it is possible that new genetic tests will be appropriate for her and/or her family members in the future. We encouraged her to remain in contact with cancer genetics on an annual basis so we can update her personal and family histories and let her know of advances in cancer genetics that may benefit this family.   Our contact number was provided. Katherine Marshall questions were answered to her satisfaction, and she knows she is welcome to call us at anytime with additional questions or concerns.   Maylon Cos, MS, [facility] Licensed, Certified Genetic Counselor [email or web address]

## 2023-08-20 ENCOUNTER — Encounter: Payer: Managed Care, Other (non HMO) | Admitting: Radiology

## 2023-08-21 NOTE — Progress Notes (Signed)
cancelled

## 2023-09-01 ENCOUNTER — Other Ambulatory Visit: Payer: Self-pay | Admitting: Physician Assistant

## 2023-09-01 ENCOUNTER — Other Ambulatory Visit (HOSPITAL_BASED_OUTPATIENT_CLINIC_OR_DEPARTMENT_OTHER): Payer: Self-pay

## 2023-09-01 ENCOUNTER — Ambulatory Visit: Payer: Managed Care, Other (non HMO) | Admitting: Dermatology

## 2023-09-01 ENCOUNTER — Encounter: Payer: Self-pay | Admitting: Dermatology

## 2023-09-01 ENCOUNTER — Other Ambulatory Visit: Payer: Self-pay

## 2023-09-01 VITALS — BP 106/75 | HR 68

## 2023-09-01 DIAGNOSIS — Z1283 Encounter for screening for malignant neoplasm of skin: Secondary | ICD-10-CM

## 2023-09-01 DIAGNOSIS — Z85828 Personal history of other malignant neoplasm of skin: Secondary | ICD-10-CM

## 2023-09-01 DIAGNOSIS — W908XXA Exposure to other nonionizing radiation, initial encounter: Secondary | ICD-10-CM

## 2023-09-01 DIAGNOSIS — D1801 Hemangioma of skin and subcutaneous tissue: Secondary | ICD-10-CM

## 2023-09-01 DIAGNOSIS — L578 Other skin changes due to chronic exposure to nonionizing radiation: Secondary | ICD-10-CM

## 2023-09-01 DIAGNOSIS — Z808 Family history of malignant neoplasm of other organs or systems: Secondary | ICD-10-CM

## 2023-09-01 DIAGNOSIS — L814 Other melanin hyperpigmentation: Secondary | ICD-10-CM

## 2023-09-01 DIAGNOSIS — D229 Melanocytic nevi, unspecified: Secondary | ICD-10-CM

## 2023-09-01 DIAGNOSIS — L821 Other seborrheic keratosis: Secondary | ICD-10-CM

## 2023-09-01 MED ORDER — LEVOTHYROXINE SODIUM 88 MCG PO TABS
88.0000 ug | ORAL_TABLET | Freq: Every morning | ORAL | 0 refills | Status: DC
  Filled 2023-09-01: qty 30, 30d supply, fill #0
  Filled 2023-10-16: qty 30, 30d supply, fill #1
  Filled 2023-12-06: qty 30, 30d supply, fill #2

## 2023-09-01 NOTE — Patient Instructions (Signed)

## 2023-09-01 NOTE — Progress Notes (Signed)
   New Patient Visit   Subjective  Katherine Marshall is a 37 y.o. female who presents for the following: Skin Cancer Screening and Full Body Skin Exam with  The patient presents for Total-Body Skin Exam (TBSE) for skin cancer screening and mole check. The patient has spots, moles and lesions to be evaluated, some may be new or changing. Pt has hx of BCC on left preauricular region. Paternal aunt had MM  The following portions of the chart were reviewed this encounter and updated as appropriate: medications, allergies, medical history  Review of Systems:  No other skin or systemic complaints except as noted in HPI or Assessment and Plan.  Objective  Well appearing patient in no apparent distress; mood and affect are within normal limits.  A full examination was performed including scalp, head, eyes, ears, nose, lips, neck, chest, axillae, abdomen, back, buttocks, bilateral upper extremities, bilateral lower extremities, hands, feet, fingers, toes, fingernails, and toenails. All findings within normal limits unless otherwise noted below.   Relevant physical exam findings are noted in the Assessment and Plan.   Assessment & Plan   SKIN CANCER SCREENING PERFORMED TODAY.  ACTINIC DAMAGE - Chronic condition, secondary to cumulative UV/sun exposure - diffuse scaly erythematous macules with underlying dyspigmentation - Recommend daily broad spectrum sunscreen SPF 30+ to sun-exposed areas, reapply every 2 hours as needed.  - Staying in the shade or wearing long sleeves, sun glasses (UVA+UVB protection) and wide brim hats (4-inch brim around the entire circumference of the hat) are also recommended for sun protection.  - Call for new or changing lesions.  MELANOCYTIC NEVI - Tan-brown and/or pink-flesh-colored symmetric macules and papules - Benign appearing on exam today - Observation - Call clinic for new or changing moles - Recommend daily use of broad spectrum spf 30+ sunscreen to  sun-exposed areas.   SEBORRHEIC KERATOSIS - Stuck-on, waxy, tan-brown papules and/or plaques  - Benign-appearing - Discussed benign etiology and prognosis. - Observe - Call for any changes  LENTIGINES Exam: scattered tan macules Due to sun exposure Treatment Plan: Benign-appearing, observe. Recommend daily broad spectrum sunscreen SPF 30+ to sun-exposed areas, reapply every 2 hours as needed.  Call for any changes    HEMANGIOMA Exam: red papule(s) Discussed benign nature. Recommend observation. Call for changes.  HISTORY OF BASAL CELL CARCINOMA- left preauricular region - Clear. Observe for recurrence.  - Call clinic for new or changing lesions.   - Recommend regular skin exams, daily broad-spectrum spf 30+ sunscreen use, and photoprotection.     Return in about 1 year (around 08/31/2024) for TBSE.  I, Tillie Fantasia, CMA, am acting as scribe for Gwenith Daily, MD.   Documentation: I have reviewed the above documentation for accuracy and completeness, and I agree with the above.  Gwenith Daily, MD

## 2023-10-16 ENCOUNTER — Other Ambulatory Visit (HOSPITAL_BASED_OUTPATIENT_CLINIC_OR_DEPARTMENT_OTHER): Payer: Self-pay

## 2023-10-16 ENCOUNTER — Other Ambulatory Visit: Payer: Self-pay

## 2023-10-16 ENCOUNTER — Other Ambulatory Visit: Payer: Self-pay | Admitting: Radiology

## 2023-10-16 DIAGNOSIS — F419 Anxiety disorder, unspecified: Secondary | ICD-10-CM

## 2023-10-16 MED ORDER — FLUOXETINE HCL 40 MG PO CAPS
40.0000 mg | ORAL_CAPSULE | Freq: Every day | ORAL | 0 refills | Status: DC
Start: 2023-10-16 — End: 2023-11-30
  Filled 2023-10-16: qty 30, 30d supply, fill #0

## 2023-10-16 NOTE — Telephone Encounter (Signed)
Med refill request: Fluoxetine 40 mg Last AEX: 06/17/22 Next AEX: none scheduled Last MMG (if hormonal med) n/a Refill sent to provider for approval or denial as appropriate.

## 2023-10-19 ENCOUNTER — Ambulatory Visit: Payer: Managed Care, Other (non HMO) | Admitting: Internal Medicine

## 2023-10-19 ENCOUNTER — Other Ambulatory Visit (HOSPITAL_BASED_OUTPATIENT_CLINIC_OR_DEPARTMENT_OTHER): Payer: Self-pay

## 2023-10-19 ENCOUNTER — Ambulatory Visit (INDEPENDENT_AMBULATORY_CARE_PROVIDER_SITE_OTHER)
Admission: RE | Admit: 2023-10-19 | Discharge: 2023-10-19 | Disposition: A | Discharge status: Home / Self Care | Payer: Managed Care, Other (non HMO) | Source: Ambulatory Visit | Attending: Internal Medicine | Admitting: Internal Medicine

## 2023-10-19 VITALS — BP 110/80 | HR 77 | Temp 97.6°F | Ht 60.0 in | Wt 170.8 lb

## 2023-10-19 DIAGNOSIS — J22 Unspecified acute lower respiratory infection: Secondary | ICD-10-CM | POA: Diagnosis not present

## 2023-10-19 LAB — POC COVID19 BINAXNOW: SARS Coronavirus 2 Ag: NEGATIVE

## 2023-10-19 LAB — POCT INFLUENZA A/B
Influenza A, POC: NEGATIVE
Influenza B, POC: NEGATIVE

## 2023-10-19 MED ORDER — PROMETHAZINE-DM 6.25-15 MG/5ML PO SYRP
5.0000 mL | ORAL_SOLUTION | Freq: Four times a day (QID) | ORAL | 0 refills | Status: DC | PRN
  Filled 2023-10-19: qty 180, 9d supply, fill #0

## 2023-10-19 MED ORDER — AZITHROMYCIN 250 MG PO TABS
ORAL_TABLET | ORAL | 0 refills | Status: AC
  Filled 2023-10-19: qty 6, 5d supply, fill #0

## 2023-10-19 MED ORDER — AMOXICILLIN-POT CLAVULANATE 875-125 MG PO TABS
1.0000 | ORAL_TABLET | Freq: Two times a day (BID) | ORAL | 0 refills | Status: AC
Start: 2023-10-19 — End: 2023-10-26
  Filled 2023-10-19: qty 14, 7d supply, fill #0

## 2023-10-19 NOTE — Progress Notes (Signed)
[facility] [address] [place] [address] Dept: [phone] Dept Fax: [phone]  Acute Care Office Visit  Subjective:   Katherine Marshall 1986/04/30 10/19/2023  Chief Complaint  Patient presents with   Cough   Fatigue   Headache    HPI: Discussed the use of AI scribe software for clinical note transcription with the patient, who gave verbal consent to proceed.  History of Present Illness   The patient presents with chest pain and cough, which she describes as 'my chest hurts' and 'when I breathe out, I can feel gunk or something, but it's not coming up.' She reports that her chest feels tight and the discomfort is worse in the morning, possibly after lying down all night. She also reports feeling fatigued and has been taking ibuprofen for symptom relief. She denies having a fever. She has been in close contact with her son and husband, both of whom have been diagnosed with pneumonia. The patient also reports feeling slightly short of breath, but emphasizes that the chest discomfort is the primary concern. She has not been around anyone with the flu to her knowledge. She also reports a decrease in appetite and has been trying to consume warm liquids like coffee for relief.         The following portions of the patient's history were reviewed and updated as appropriate: past medical history, past surgical history, family history, social history, allergies, medications, and problem list.   Patient Active Problem List   Diagnosis Date Noted   Genetic testing 07/29/2023   Family history of pancreatic cancer    Family history of breast cancer    Family history of melanoma    Family history of brain cancer    Insulin resistance 12/18/2022   Hypertension in pregnancy, preeclampsia, severe, delivered 05/14/2021   Hypothyroidism 05/14/2021   Anxiety 05/14/2021   Calculus of kidney 02/18/2016   Past Medical History:   Diagnosis Date   Anxiety    Basal cell carcinoma    left side of face   Family history of brain cancer    Family history of breast cancer    Family history of melanoma    Family history of pancreatic cancer    GERD (gastroesophageal reflux disease)    Gestational thrombocytopenia without hemorrhage (HCC) 10/06/2016   Hypertension    PIH with last preg   Hypothyroidism    IBS (irritable bowel syndrome)    Kidney stones    Migraines    as a child   Pregnancy induced hypertension    Preterm delivery 2020   Vaginal delivery    20015, 2017, 2022   Past Surgical History:  Procedure Laterality Date   BASAL CELL CARCINOMA EXCISION     DENTAL SURGERY     Family History  Problem Relation Age of Onset   Hypertension Mother    Pancreatic cancer Mother 26   Thyroid cancer Father    Hypertension Father    Hypertension Brother    Diabetes Maternal Uncle    Non-Hodgkin's lymphoma Maternal Uncle 49   Melanoma Maternal Uncle    Cancer Maternal Uncle        sarcoma   Melanoma Paternal Aunt    Hypertension Maternal Grandmother    Heart attack Maternal Grandmother    Heart attack Maternal Grandfather    Colon cancer Paternal Grandmother 31   Hypertension Paternal Grandmother    Ulcerative colitis Paternal Grandmother    Brain cancer Paternal Grandfather  67       glioblastoma   Hypertension Paternal Grandfather    Melanoma Other        PGF's sister   Breast cancer Other        MGM's two sisters   Breast cancer Other        MGF's mother   Hearing loss Neg Hx     Current Outpatient Medications:    acetaminophen (TYLENOL) 325 MG tablet, Take 2 tablets (650 mg total) by mouth every 4 (four) hours as needed (for pain scale < 4)., Disp: 30 tablet, Rfl: 1   amoxicillin-clavulanate (AUGMENTIN) 875-125 MG tablet, Take 1 tablet by mouth 2 (two) times daily for 7 days., Disp: 14 tablet, Rfl: 0   azithromycin (ZITHROMAX) 250 MG tablet, Take 2 tablets on day 1, then 1 tablet daily on days  2 through 5, Disp: 6 tablet, Rfl: 0   FLUoxetine (PROZAC) 40 MG capsule, Take 1 capsule (40 mg total) by mouth daily., Disp: 30 capsule, Rfl: 0   ibuprofen (ADVIL) 600 MG tablet, Take 1 tablet (600 mg total) by mouth every 6 (six) hours., Disp: 30 tablet, Rfl: 0   levothyroxine (SYNTHROID) 88 MCG tablet, Take 1 tablet (88 mcg total) by mouth in the morning on an empty stomach., Disp: 30 tablet, Rfl: 0   LORazepam (ATIVAN) 0.5 MG tablet, Take 1 tablet orally daily Take as needed, Disp: 30 tablet, Rfl: 0   ondansetron (ZOFRAN-ODT) 4 MG disintegrating tablet, Take 1 tablet (4 mg total) by mouth every 8 (eight) hours as needed for nausea or vomiting., Disp: 20 tablet, Rfl: 0   promethazine-dextromethorphan (PROMETHAZINE-DM) 6.25-15 MG/5ML syrup, Take 5 mLs by mouth 4 (four) times daily as needed for cough., Disp: 180 mL, Rfl: 0   levothyroxine (SYNTHROID) 88 MCG tablet, Take 1 tablet (88 mcg total) by mouth every morning on an empty stomach., Disp: 90 tablet, Rfl: 0   omeprazole (PRILOSEC) 40 MG capsule, Take 1 capsule (40 mg total) by mouth daily. (Patient not taking: Reported on 10/19/2023), Disp: 30 capsule, Rfl: 2   sucralfate (CARAFATE) 1 GM/10ML suspension, Take 10 mLs (1 g total) by mouth 4 (four) times daily -  with meals and at bedtime., Disp: 420 mL, Rfl: 0 No Known Allergies   ROS: A complete ROS was performed with pertinent positives/negatives noted in the HPI. The remainder of the ROS are negative.    Objective:   Today's Vitals   10/19/23 1551  BP: 110/80  Pulse: 77  Temp: 97.6 F (36.4 C)  TempSrc: Temporal  SpO2: 97%  Weight: 170 lb 12.8 oz (77.5 kg)  Height: 5' (1.524 m)    GENERAL: Well-appearing, in NAD. Well nourished.  SKIN: Pink, warm and dry. No rash, lesion, ulceration, or ecchymoses.  HEENT:    HEAD: Normocephalic, non-traumatic.  EYES: Conjunctive pink without exudate. PERRL, EOMI.  EARS: External ear w/o redness, swelling, masses, or lesions. EAC clear. TM's  intact, translucent w/o bulging, appropriate landmarks visualized.  NOSE: Septum midline w/o deformity. Nares patent, mucosa pink and non-inflamed w/o drainage. No sinus tenderness.  THROAT: Uvula midline. Oropharynx clear. Tonsils non-inflamed w/o exudate. Mucus membranes pink and moist.  NECK: Trachea midline. Full ROM w/o pain or tenderness. No lymphadenopathy.  RESPIRATORY: Chest wall symmetrical. Respirations even and non-labored. Breath sounds clear to auscultation bilaterally. (+)cough  CARDIAC: S1, S2 present, regular rate and rhythm. Peripheral pulses 2+ bilaterally.  EXTREMITIES: Without clubbing, cyanosis, or edema.  NEUROLOGIC:  Steady, even gait.  PSYCH/MENTAL STATUS:  Alert, oriented x 3. Cooperative, appropriate mood and affect.    Results for orders placed or performed in visit on 10/19/23  POCT Influenza A/B  Result Value Ref Range   Influenza A, POC Negative Negative   Influenza B, POC Negative Negative  POC COVID-19 BinaxNow  Result Value Ref Range   SARS Coronavirus 2 Ag Negative Negative      Assessment & Plan:  Assessment and Plan    Acute Lower Respiratory Infection with high suspicion for Pneumonia Chest discomfort, cough, and fatigue. No fever. Family members recently diagnosed with pneumonia. Physical exam consistent with pneumonia. -Order chest X-ray. -Start Azithromycin (Z-Pak) and Augmentin. -Prescribe Phenergan DM for cough. -Perform COVID and flu tests.  General Health Maintenance / Followup Plans -Review results of COVID and flu tests. -Follow up on chest X-ray results.        Meds ordered this encounter  Medications   amoxicillin-clavulanate (AUGMENTIN) 875-125 MG tablet    Sig: Take 1 tablet by mouth 2 (two) times daily for 7 days.    Dispense:  14 tablet    Refill:  0    Supervising Provider:   Garnette Gunner [[id number]]   azithromycin (ZITHROMAX) 250 MG tablet    Sig: Take 2 tablets on day 1, then 1 tablet daily on days 2 through 5     Dispense:  6 tablet    Refill:  0    Supervising Provider:   Garnette Gunner [[id number]]   promethazine-dextromethorphan (PROMETHAZINE-DM) 6.25-15 MG/5ML syrup    Sig: Take 5 mLs by mouth 4 (four) times daily as needed for cough.    Dispense:  180 mL    Refill:  0    Supervising Provider:   Garnette Gunner [[id number]]   Orders Placed This Encounter  Procedures   DG Chest 2 View    Standing Status:   Future    Expiration Date:   10/18/2024    Preferred imaging location?:   [place]    Reason for exam::   cough, chest pain. family members with pneumonia. Rule out pneumonia    Release to patient:   Immediate   POCT Influenza A/B   POC COVID-19 BinaxNow    Previously tested for COVID-19:   No    Resident in a congregate (group) care setting:   No    Employed in healthcare setting:   Unknown    Pregnant:   No   Lab Orders         POCT Influenza A/B         POC COVID-19 BinaxNow     No images are attached to the encounter or orders placed in the encounter.  Return if symptoms worsen or fail to improve.   Salvatore Decent, FNP

## 2023-10-19 NOTE — Patient Instructions (Signed)
Go for chest x-ray.

## 2023-11-03 ENCOUNTER — Other Ambulatory Visit (HOSPITAL_BASED_OUTPATIENT_CLINIC_OR_DEPARTMENT_OTHER): Payer: Self-pay

## 2023-11-03 ENCOUNTER — Encounter: Payer: Self-pay | Admitting: Internal Medicine

## 2023-11-03 DIAGNOSIS — J22 Unspecified acute lower respiratory infection: Secondary | ICD-10-CM

## 2023-11-03 MED ORDER — PREDNISONE 10 MG (21) PO TBPK
ORAL_TABLET | ORAL | 0 refills | Status: DC
  Filled 2023-11-03: qty 21, 6d supply, fill #0

## 2023-11-03 MED ORDER — ALBUTEROL SULFATE HFA 108 (90 BASE) MCG/ACT IN AERS
2.0000 | INHALATION_SPRAY | Freq: Four times a day (QID) | RESPIRATORY_TRACT | 2 refills | Status: DC | PRN
  Filled 2023-11-03: qty 6.7, 25d supply, fill #0

## 2023-11-23 ENCOUNTER — Other Ambulatory Visit: Payer: Self-pay | Admitting: Radiology

## 2023-11-23 DIAGNOSIS — F419 Anxiety disorder, unspecified: Secondary | ICD-10-CM

## 2023-11-23 NOTE — Telephone Encounter (Signed)
Med refill request: Fluoxetine 40 mg Last AEX: 06/17/22 Next AEX: none scheduled, cancelled 06/23/23 Last MMG (if hormonal med) n/a Refill sent to provider for approval or denial as appropriate.

## 2023-11-26 ENCOUNTER — Other Ambulatory Visit (HOSPITAL_BASED_OUTPATIENT_CLINIC_OR_DEPARTMENT_OTHER): Payer: Self-pay

## 2023-11-27 ENCOUNTER — Other Ambulatory Visit (HOSPITAL_BASED_OUTPATIENT_CLINIC_OR_DEPARTMENT_OTHER): Payer: Self-pay

## 2023-11-28 ENCOUNTER — Other Ambulatory Visit: Payer: Self-pay | Admitting: Radiology

## 2023-11-28 ENCOUNTER — Other Ambulatory Visit (HOSPITAL_BASED_OUTPATIENT_CLINIC_OR_DEPARTMENT_OTHER): Payer: Self-pay

## 2023-11-28 DIAGNOSIS — F419 Anxiety disorder, unspecified: Secondary | ICD-10-CM

## 2023-11-30 ENCOUNTER — Other Ambulatory Visit: Payer: Self-pay | Admitting: Radiology

## 2023-11-30 ENCOUNTER — Other Ambulatory Visit (HOSPITAL_BASED_OUTPATIENT_CLINIC_OR_DEPARTMENT_OTHER): Payer: Self-pay

## 2023-11-30 DIAGNOSIS — F419 Anxiety disorder, unspecified: Secondary | ICD-10-CM

## 2023-11-30 MED ORDER — FLUOXETINE HCL 40 MG PO CAPS
40.0000 mg | ORAL_CAPSULE | Freq: Every day | ORAL | 0 refills | Status: DC
  Filled 2023-11-30: qty 30, 30d supply, fill #0

## 2023-11-30 NOTE — Telephone Encounter (Signed)
 Refill approved.

## 2023-11-30 NOTE — Telephone Encounter (Signed)
 Med refill request: Fluoxetine Last AEX: 05/2022-JC Next AEX: 01/05/2024-JC Last MMG (if hormonal med): n/a  Refill authorized: rx pend.

## 2023-12-02 ENCOUNTER — Other Ambulatory Visit (HOSPITAL_BASED_OUTPATIENT_CLINIC_OR_DEPARTMENT_OTHER): Payer: Self-pay

## 2023-12-27 ENCOUNTER — Other Ambulatory Visit: Payer: Self-pay | Admitting: Obstetrics and Gynecology

## 2023-12-27 DIAGNOSIS — F419 Anxiety disorder, unspecified: Secondary | ICD-10-CM

## 2023-12-29 ENCOUNTER — Other Ambulatory Visit (HOSPITAL_BASED_OUTPATIENT_CLINIC_OR_DEPARTMENT_OTHER): Payer: Self-pay

## 2023-12-29 MED ORDER — FLUOXETINE HCL 40 MG PO CAPS
40.0000 mg | ORAL_CAPSULE | Freq: Every day | ORAL | 0 refills | Status: DC
  Filled 2023-12-29: qty 30, 30d supply, fill #0

## 2023-12-29 NOTE — Telephone Encounter (Signed)
 Med refill request: Fluoxetine Last AEX: 05/2022-JC Next AEX: 01/05/2024-JC Last MMG (if hormonal med): n/a  Refill authorized: rx pend.

## 2024-01-05 ENCOUNTER — Other Ambulatory Visit: Payer: Self-pay

## 2024-01-05 ENCOUNTER — Encounter: Payer: Self-pay | Admitting: Radiology

## 2024-01-05 ENCOUNTER — Other Ambulatory Visit (HOSPITAL_BASED_OUTPATIENT_CLINIC_OR_DEPARTMENT_OTHER): Payer: Self-pay

## 2024-01-05 ENCOUNTER — Other Ambulatory Visit: Payer: Self-pay | Admitting: Physician Assistant

## 2024-01-05 ENCOUNTER — Ambulatory Visit (INDEPENDENT_AMBULATORY_CARE_PROVIDER_SITE_OTHER): Payer: Managed Care, Other (non HMO) | Admitting: Radiology

## 2024-01-05 VITALS — BP 104/72 | HR 81 | Ht 60.75 in | Wt 175.0 lb

## 2024-01-05 DIAGNOSIS — Z1331 Encounter for screening for depression: Secondary | ICD-10-CM | POA: Diagnosis not present

## 2024-01-05 DIAGNOSIS — E661 Drug-induced obesity: Secondary | ICD-10-CM | POA: Diagnosis not present

## 2024-01-05 DIAGNOSIS — F419 Anxiety disorder, unspecified: Secondary | ICD-10-CM | POA: Diagnosis not present

## 2024-01-05 DIAGNOSIS — E66811 Obesity, class 1: Secondary | ICD-10-CM

## 2024-01-05 DIAGNOSIS — Z01419 Encounter for gynecological examination (general) (routine) without abnormal findings: Secondary | ICD-10-CM

## 2024-01-05 DIAGNOSIS — Z6833 Body mass index (BMI) 33.0-33.9, adult: Secondary | ICD-10-CM

## 2024-01-05 MED ORDER — BUPROPION HCL ER (XL) 150 MG PO TB24
150.0000 mg | ORAL_TABLET | Freq: Every day | ORAL | 4 refills | Status: DC
  Filled 2024-01-05: qty 90, 90d supply, fill #0
  Filled 2024-02-18 – 2024-05-04 (×2): qty 90, 90d supply, fill #1

## 2024-01-05 MED ORDER — LORAZEPAM 0.5 MG PO TABS
0.5000 mg | ORAL_TABLET | Freq: Three times a day (TID) | ORAL | 0 refills | Status: DC | PRN
Start: 2024-01-05 — End: 2024-02-18
  Filled 2024-01-05: qty 30, 10d supply, fill #0

## 2024-01-05 MED ORDER — LEVOTHYROXINE SODIUM 88 MCG PO TABS
88.0000 ug | ORAL_TABLET | Freq: Every morning | ORAL | 0 refills | Status: DC
  Filled 2024-01-05: qty 30, 30d supply, fill #0

## 2024-01-05 NOTE — Progress Notes (Signed)
 Katherine Marshall Jun 18, 1986 [id number]   History:  38 y.o. G3P3 presents for annual exam.Worsening depression and anxiety since mother died last month. Currently on Prozac 40mg , feels like it is not working anymore and keeping weight on. She is interested in trying something else. In the process of starting therapy through hospice. Interested in restarting a GLP-1 if insurance covers it. She is exercising multiple times a week. Walking and strength training.  Gynecologic History Patient's last menstrual period was 12/28/2023 (exact date). Period Cycle (Days): 28 Period Duration (Days): 4 Period Pattern: Regular Menstrual Flow: Moderate Menstrual Control: Thin pad, Maxi pad, Tampon Dysmenorrhea: (!) Mild Dysmenorrhea Symptoms: Cramping Contraception/Family planning: vasectomy Sexually active: yes Last Pap: 2023. Results were: normal   Obstetric History OB History  Gravida Para Term Preterm AB Living  9 4 2 2 5 3   SAB IAB Ectopic Multiple Live Births  5 0 0 0 3    # Outcome Date GA Lbr Len/2nd Weight Sex Type Anes PTL Lv  9 Preterm 05/14/21 [redacted]w[redacted]d / 00:08 4 lb 7.3 oz (2.021 kg) F Vag-Spont EPI  LIV  8 Preterm 08/21/19 [redacted]w[redacted]d / 00:17 10.7 oz (0.303 kg) M Vag-Spont EPI  FD     Birth Comments: n/a  7 Term 10/04/16 [redacted]w[redacted]d 02:10 / 00:28 6 lb 0.7 oz (2.741 kg) M Vag-Spont EPI  LIV     Birth Comments: none  6 Term 04/14/14 [redacted]w[redacted]d -13:15 / 02:10 5 lb 5 oz (2.41 kg) M Vag-Spont EPI  LIV     Complications: Preeclampsia  5 SAB           4 SAB           3 SAB           2 SAB           1 SAB                01/05/2024   12:05 PM 06/16/2023   10:58 AM 12/18/2022    9:26 AM  Depression screen PHQ 2/9  Decreased Interest 3 0 0  Down, Depressed, Hopeless 3 0 0  PHQ - 2 Score 6 0 0  Altered sleeping 3    Tired, decreased energy 3    Change in appetite 2    Feeling bad or failure about yourself  3    Trouble concentrating 3    Moving slowly or fidgety/restless 3    Suicidal thoughts 0     PHQ-9 Score 23    Difficult doing work/chores Very difficult       The following portions of the patient's history were reviewed and updated as appropriate: allergies, current medications, past family history, past medical history, past social history, past surgical history, and problem list.  Review of Systems  Constitutional: Negative.   Cardiovascular: Negative.   Gastrointestinal:  Negative for abdominal pain, blood in stool, constipation, nausea and vomiting.  Genitourinary: Negative.  Negative for dysuria, flank pain, frequency, hematuria and urgency.  Endo/Heme/Allergies:  Does not bruise/bleed easily.  Psychiatric/Behavioral: Negative.  Negative for depression, memory loss and substance abuse. The patient is not nervous/anxious and does not have insomnia.     Past medical history, past surgical history, family history and social history were all reviewed and documented in the EPIC chart.  Exam:  Vitals:   01/05/24 1202  BP: 104/72  Pulse: 81  SpO2: 96%  Weight: 175 lb (79.4 kg)  Height: 5' 0.75" (1.543 m)   Body mass index is  33.34 kg/m.  Physical Exam Vitals and nursing note reviewed. Exam conducted with a chaperone present.  Constitutional:      Appearance: Normal appearance. She is obese.  HENT:     Head: Normocephalic and atraumatic.  Neck:     Thyroid: No thyroid mass, thyromegaly or thyroid tenderness.  Cardiovascular:     Rate and Rhythm: Regular rhythm.     Heart sounds: Normal heart sounds.  Pulmonary:     Effort: Pulmonary effort is normal.     Breath sounds: Normal breath sounds.  Chest:  Breasts:    Breasts are symmetrical.     Right: Normal. No inverted nipple, mass, nipple discharge, skin change or tenderness.     Left: Normal. No inverted nipple, mass, nipple discharge, skin change or tenderness.  Abdominal:     General: Abdomen is flat. Bowel sounds are normal.     Palpations: Abdomen is soft.  Genitourinary:    General: Normal vulva.      Vagina: Normal. No vaginal discharge, bleeding or lesions.     Cervix: Normal. No discharge or lesion.     Uterus: Normal. Not enlarged and not tender.      Adnexa: Right adnexa normal and left adnexa normal.       Right: No mass, tenderness or fullness.         Left: No mass, tenderness or fullness.    Lymphadenopathy:     Upper Body:     Right upper body: No axillary adenopathy.     Left upper body: No axillary adenopathy.  Skin:    General: Skin is warm and dry.  Neurological:     Mental Status: She is alert and oriented to person, place, and time.  Psychiatric:        Mood and Affect: Mood normal.        Thought Content: Thought content normal.        Judgment: Judgment normal.      Raynelle Fanning, CMA present for exam  Assessment/Plan:   1. Well woman exam with routine gynecological exam (Primary) Pap 2026 Labs with PCP  2. Positive depression screening Begin wellbutrin in hopes of weaning off prozac by the end of 30days. Will keep me updated how she is doing and we can decrease prozac to 20mg  before her next fill - buPROPion (WELLBUTRIN XL) 150 MG 24 hr tablet; Take 1 tablet (150 mg total) by mouth daily.  Dispense: 90 tablet; Refill: 4  3. Anxiety Use sparingly as needed for anxiety and sleep - LORazepam (ATIVAN) 0.5 MG tablet; Take 1 tablet (0.5 mg total) by mouth every 8 (eight) hours as needed for anxiety.  Dispense: 30 tablet; Refill: 0  4. Class 1 drug-induced obesity without serious comorbidity with body mass index (BMI) of 33.0 to 33.9 in adult Will check with insurance [id number] and let me know    Discussed SBE, pap and mammogram screening as directed/appropriate. Recommend of exercise weekly, including weight bearing exercise. Encouraged the use of seatbelts and sunscreen.  Return in about 1 year (around 01/04/2025) for Annual.  Tanda Rockers WHNP-BC 12:33 PM 01/05/2024

## 2024-01-05 NOTE — Patient Instructions (Signed)

## 2024-02-18 ENCOUNTER — Other Ambulatory Visit: Payer: Self-pay | Admitting: Radiology

## 2024-02-18 ENCOUNTER — Other Ambulatory Visit: Payer: Self-pay | Admitting: Physician Assistant

## 2024-02-18 ENCOUNTER — Other Ambulatory Visit: Payer: Self-pay

## 2024-02-18 ENCOUNTER — Other Ambulatory Visit (HOSPITAL_BASED_OUTPATIENT_CLINIC_OR_DEPARTMENT_OTHER): Payer: Self-pay

## 2024-02-18 DIAGNOSIS — F419 Anxiety disorder, unspecified: Secondary | ICD-10-CM

## 2024-02-18 MED ORDER — LEVOTHYROXINE SODIUM 88 MCG PO TABS
88.0000 ug | ORAL_TABLET | Freq: Every morning | ORAL | 0 refills | Status: DC
  Filled 2024-02-18: qty 30, 30d supply, fill #0

## 2024-02-18 MED ORDER — LORAZEPAM 0.5 MG PO TABS
0.5000 mg | ORAL_TABLET | Freq: Three times a day (TID) | ORAL | 0 refills | Status: AC | PRN
  Filled 2024-02-18: qty 30, 10d supply, fill #0

## 2024-02-18 NOTE — Telephone Encounter (Signed)
 Med refill request: Lorazepam  0.5mg  Last AEX: 01/05/24 Next AEX: none scheduled Last MMG (if hormonal med) n/a Refill authorized: Lorazepam  0.5mg  Please approve or deny as appropriate.

## 2024-02-22 ENCOUNTER — Encounter: Payer: Self-pay | Admitting: Physician Assistant

## 2024-02-22 ENCOUNTER — Other Ambulatory Visit (HOSPITAL_BASED_OUTPATIENT_CLINIC_OR_DEPARTMENT_OTHER): Payer: Self-pay

## 2024-02-23 ENCOUNTER — Other Ambulatory Visit (HOSPITAL_BASED_OUTPATIENT_CLINIC_OR_DEPARTMENT_OTHER): Payer: Self-pay

## 2024-02-23 ENCOUNTER — Other Ambulatory Visit: Payer: Self-pay | Admitting: Radiology

## 2024-02-23 DIAGNOSIS — F419 Anxiety disorder, unspecified: Secondary | ICD-10-CM

## 2024-02-23 DIAGNOSIS — Z1331 Encounter for screening for depression: Secondary | ICD-10-CM

## 2024-02-23 MED ORDER — FLUOXETINE HCL 20 MG PO CAPS
20.0000 mg | ORAL_CAPSULE | Freq: Every day | ORAL | 1 refills | Status: DC
  Filled 2024-02-23: qty 30, 30d supply, fill #0
  Filled 2024-03-28: qty 30, 30d supply, fill #1

## 2024-02-23 NOTE — Telephone Encounter (Signed)
 AEX 01/05/24

## 2024-02-29 ENCOUNTER — Ambulatory Visit: Admitting: Physician Assistant

## 2024-02-29 VITALS — BP 126/86 | HR 65 | Temp 97.7°F | Ht 61.0 in | Wt 173.8 lb

## 2024-02-29 DIAGNOSIS — E039 Hypothyroidism, unspecified: Secondary | ICD-10-CM | POA: Diagnosis not present

## 2024-02-29 DIAGNOSIS — E669 Obesity, unspecified: Secondary | ICD-10-CM

## 2024-02-29 DIAGNOSIS — E559 Vitamin D deficiency, unspecified: Secondary | ICD-10-CM | POA: Diagnosis not present

## 2024-02-29 DIAGNOSIS — R5383 Other fatigue: Secondary | ICD-10-CM | POA: Diagnosis not present

## 2024-02-29 DIAGNOSIS — Z6832 Body mass index (BMI) 32.0-32.9, adult: Secondary | ICD-10-CM

## 2024-02-29 LAB — VITAMIN B12: Vitamin B-12: 361 pg/mL (ref 211–911)

## 2024-02-29 LAB — COMPREHENSIVE METABOLIC PANEL WITH GFR
ALT: 11 U/L (ref 0–35)
AST: 14 U/L (ref 0–37)
Albumin: 4.2 g/dL (ref 3.5–5.2)
Alkaline Phosphatase: 42 U/L (ref 39–117)
BUN: 20 mg/dL (ref 6–23)
CO2: 30 meq/L (ref 19–32)
Calcium: 9.3 mg/dL (ref 8.4–10.5)
Chloride: 100 meq/L (ref 96–112)
Creatinine, Ser: 0.94 mg/dL (ref 0.40–1.20)
GFR: 76.44 mL/min (ref >60.00)
Glucose, Bld: 76 mg/dL (ref 70–99)
Potassium: 4.3 meq/L (ref 3.5–5.1)
Sodium: 137 meq/L (ref 135–145)
Total Bilirubin: 0.3 mg/dL (ref 0.2–1.2)
Total Protein: 7.1 g/dL (ref 6.0–8.3)

## 2024-02-29 LAB — CBC WITH DIFFERENTIAL/PLATELET
Basophils Absolute: 0 10*3/uL (ref 0.0–0.1)
Basophils Relative: 0.5 % (ref 0.0–3.0)
Eosinophils Absolute: 0.1 10*3/uL (ref 0.0–0.7)
Eosinophils Relative: 0.9 % (ref 0.0–5.0)
HCT: 40.5 % (ref 36.0–46.0)
Hemoglobin: 13.5 g/dL (ref 12.0–15.0)
Lymphocytes Relative: 32.6 % (ref 12.0–46.0)
Lymphs Abs: 2.6 10*3/uL (ref 0.7–4.0)
MCHC: 33.3 g/dL (ref 30.0–36.0)
MCV: 93.3 fl (ref 78.0–100.0)
Monocytes Absolute: 0.5 10*3/uL (ref 0.1–1.0)
Monocytes Relative: 5.8 % (ref 3.0–12.0)
Neutro Abs: 4.7 10*3/uL (ref 1.4–7.7)
Neutrophils Relative %: 60.2 % (ref 43.0–77.0)
Platelets: 170 10*3/uL (ref 150.0–400.0)
RBC: 4.34 Mil/uL (ref 3.87–5.11)
RDW: 13 % (ref 11.5–15.5)
WBC: 7.8 10*3/uL (ref 4.0–10.5)

## 2024-02-29 LAB — TSH: TSH: 2.48 u[IU]/mL (ref 0.35–5.50)

## 2024-02-29 LAB — IBC + FERRITIN
Ferritin: 17 ng/mL (ref 10.0–291.0)
Iron: 63 ug/dL (ref 42–145)
Saturation Ratios: 17.2 % — ABNORMAL LOW (ref 20.0–50.0)
TIBC: 366.8 ug/dL (ref 250.0–450.0)
Transferrin: 262 mg/dL (ref 212.0–360.0)

## 2024-02-29 LAB — VITAMIN D 25 HYDROXY (VIT D DEFICIENCY, FRACTURES): VITD: 33.27 ng/mL (ref 30.00–100.00)

## 2024-02-29 NOTE — Progress Notes (Signed)
 Katherine Marshall is a 38 y.o. female here for a new problem.  History of Present Illness:   Chief Complaint  Patient presents with   Fatigue    Wondering if thyroid  is off hasn't had it checked in a while. States tiredness all day, brain fog and can't stay focused.     HPI  Fatigue // Hypothyroidism Pt complains of fatigue. Pt also reports overall tiredness, brain fog, difficulty saying whole sentences, and reduced focus. Pt notes her mother passed away in 12/15/23 and has been consulting grief counseling. She states she is unsure if this fatigue is related to her hypothyroidism. She notes there has been a manufacturer to her thyroid  medication, but these symptoms began prior. 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 Per pt, she sometimes wakes up "not feeling good" and tends to fall asleep when she goes home for lunch. She reports sleeping from 9-5 and rotating her exercise between mornings and afternoons despite fatigue. She also reports she started using an Oura ring for the past week. Per pt, the Oura ring states she gets minimal deep sleep with a recent max of 30 mins. Pt is weaning off Prozac ; she is currently on 20 mg once daily and will decrease to 10 mg in a month.  The plan is to eventually stop this prescription.  She reports she is switching to Wellbutrin  XL, but feels like this medication change is not helpful. Reports regular periods; not extremely heavy. Reports good sleeping habits, caffeine intake, and normal water intake. Denies ever taking iron supplements.  Weight management She attributes her weight gain due to caring for her mother for 6 months. Reports well-balanced diets, regular exercise, and calorie tracking (1700 kcal/day). Denies overly restricting calories.  Past Medical History:  Diagnosis Date   Anxiety    Basal cell carcinoma    left side of face   Family history of brain cancer    Family  history of breast cancer    Family history of melanoma    Family history of pancreatic cancer    GERD (gastroesophageal reflux disease)    Gestational thrombocytopenia without hemorrhage (HCC) 10/06/2016   Hypertension    PIH with last preg   Hypothyroidism    IBS (irritable bowel syndrome)    Kidney stones    Migraines    as a child   Pregnancy induced hypertension    Preterm delivery 2020   Vaginal delivery    20015, 2017, 2022     Social History   Tobacco Use   Smoking status: Never    Passive exposure: Never   Smokeless tobacco: Never  Vaping Use   Vaping status: Never Used  Substance Use Topics   Alcohol use: Yes    Alcohol/week: 1.0 - 2.0 standard drink of alcohol    Types: 1 - 2 Standard drinks or equivalent per week    Comment: occ glass of wine   Drug use: No    Past Surgical History:  Procedure Laterality Date   BASAL CELL CARCINOMA EXCISION     DENTAL SURGERY      Family History  Problem Relation Age of Onset   Hypertension Mother    Pancreatic cancer Mother 73   Thyroid  cancer Father    Hypertension Father    Hypertension Brother    Diabetes Maternal Uncle    Non-Hodgkin's lymphoma Maternal Uncle 88   Melanoma Maternal Uncle    Cancer Maternal Uncle  sarcoma   Melanoma Paternal Aunt    Hypertension Maternal Grandmother    Heart attack Maternal Grandmother    Heart attack Maternal Grandfather    Colon cancer Paternal Grandmother 57   Hypertension Paternal Grandmother    Ulcerative colitis Paternal Grandmother    Brain cancer Paternal Grandfather 23       glioblastoma   Hypertension Paternal Grandfather    Melanoma Other        PGF's sister   Breast cancer Other        MGM's two sisters   Breast cancer Other        MGF's mother   Hearing loss Neg Hx     No Known Allergies  Current Medications:   Current Outpatient Medications:    acetaminophen  (TYLENOL ) 325 MG tablet, Take 2 tablets (650 mg total) by mouth every 4 (four)  hours as needed (for pain scale < 4)., Disp: 30 tablet, Rfl: 1   buPROPion  (WELLBUTRIN  XL) 150 MG 24 hr tablet, Take 1 tablet (150 mg total) by mouth daily., Disp: 90 tablet, Rfl: 4   FLUoxetine  (PROZAC ) 20 MG capsule, Take 1 capsule (20 mg total) by mouth daily., Disp: 30 capsule, Rfl: 1   levothyroxine  (SYNTHROID ) 88 MCG tablet, Take 1 tablet (88 mcg total) by mouth every morning on an empty stomach., Disp: 30 tablet, Rfl: 0   LORazepam  (ATIVAN ) 0.5 MG tablet, Take 1 tablet (0.5 mg total) by mouth every 8 (eight) hours as needed for anxiety., Disp: 30 tablet, Rfl: 0   Review of Systems:   Negative unless otherwise specified per HPI.  Vitals:   Vitals:   02/29/24 1047  BP: 126/86  Pulse: 65  Temp: 97.7 F (36.5 C)  TempSrc: Temporal  SpO2: 97%  Weight: 173 lb 12.8 oz (78.8 kg)  Height: 5\' 1"  (1.549 m)     Body mass index is 32.84 kg/m.  Physical Exam:   Physical Exam Vitals and nursing note reviewed.  Constitutional:      General: She is not in acute distress.    Appearance: She is well-developed. She is not ill-appearing or toxic-appearing.  Cardiovascular:     Rate and Rhythm: Normal rate and regular rhythm.     Pulses: Normal pulses.     Heart sounds: Normal heart sounds, S1 normal and S2 normal.  Pulmonary:     Effort: Pulmonary effort is normal.     Breath sounds: Normal breath sounds.  Skin:    General: Skin is warm and dry.  Neurological:     Mental Status: She is alert.     GCS: GCS eye subscore is 4. GCS verbal subscore is 5. GCS motor subscore is 6.  Psychiatric:        Speech: Speech normal.        Behavior: Behavior normal. Behavior is cooperative.     Assessment and Plan:   1. Other fatigue (Primary) UpToDate on all cancer screenings No red flag symptom(s)  Update blood work to rule out organic cause of symptom(s)  Continue efforts at managing mental health - may consider transition to Trintellix if she does not find Wellbutrin  helpful Her ADHD  Adult Self Report Scale was suspicious of poss underlying ADHD -- we can consider formal evaluation in the future - CBC with Differential/Platelet - Comprehensive metabolic panel with GFR - TSH - Vitamin B12 - VITAMIN D  25 Hydroxy (Vit-D Deficiency, Fractures) - IBC + Ferritin  2. Hypothyroidism, unspecified type Update TSH and adjust levothyroxine  88  mcg accordingly - CBC with Differential/Platelet - Comprehensive metabolic panel with GFR - TSH  3. Vitamin D  deficiency - VITAMIN D  25 Hydroxy (Vit-D Deficiency, Fractures)  4. Obesity, unspecified class, unspecified obesity type, unspecified whether serious comorbidity present Continue efforts at lifestyle change without harmful calorie deficit or restrictive eating pattern  Follow up based on results, clinical symptom(s) and when due for Comprehensive Physical Exam (CPE) preventive care annual visit   I, Timoteo Force, acting as a Neurosurgeon for [employer], [place]., have documented all relevant documentation on the behalf of Alexander Iba, [place], as directed by  Alexander Iba, PA while in the presence of Alexander Iba, [place].  I, Alexander Iba, [place], have reviewed all documentation for this visit. The documentation on 02/29/24 for the exam, diagnosis, procedures, and orders are all accurate and complete.  Alexander Iba, PA-C

## 2024-03-01 ENCOUNTER — Ambulatory Visit: Payer: Self-pay | Admitting: Physician Assistant

## 2024-03-02 ENCOUNTER — Ambulatory Visit

## 2024-03-08 ENCOUNTER — Encounter: Payer: Self-pay | Admitting: Physician Assistant

## 2024-03-09 ENCOUNTER — Other Ambulatory Visit: Payer: Self-pay | Admitting: Physician Assistant

## 2024-03-09 ENCOUNTER — Other Ambulatory Visit (HOSPITAL_BASED_OUTPATIENT_CLINIC_OR_DEPARTMENT_OTHER): Payer: Self-pay

## 2024-03-09 MED ORDER — LISDEXAMFETAMINE DIMESYLATE 20 MG PO CAPS
20.0000 mg | ORAL_CAPSULE | Freq: Every day | ORAL | 0 refills | Status: DC
  Filled 2024-03-09: qty 30, 30d supply, fill #0

## 2024-03-17 ENCOUNTER — Other Ambulatory Visit (HOSPITAL_BASED_OUTPATIENT_CLINIC_OR_DEPARTMENT_OTHER): Payer: Self-pay

## 2024-03-17 ENCOUNTER — Other Ambulatory Visit: Payer: Self-pay | Admitting: Physician Assistant

## 2024-03-17 MED ORDER — LEVOTHYROXINE SODIUM 88 MCG PO TABS
88.0000 ug | ORAL_TABLET | Freq: Every morning | ORAL | 2 refills | Status: DC
  Filled 2024-03-17 – 2024-03-28 (×2): qty 30, 30d supply, fill #0
  Filled 2024-05-04: qty 30, 30d supply, fill #1
  Filled 2024-07-15: qty 30, 30d supply, fill #2

## 2024-03-28 ENCOUNTER — Other Ambulatory Visit (HOSPITAL_BASED_OUTPATIENT_CLINIC_OR_DEPARTMENT_OTHER): Payer: Self-pay

## 2024-04-06 ENCOUNTER — Other Ambulatory Visit: Payer: Self-pay | Admitting: Physician Assistant

## 2024-04-07 NOTE — Telephone Encounter (Signed)
 Pt requesting refill for Vyvanse  20 mg. Last OV 02/29/2024

## 2024-04-08 ENCOUNTER — Other Ambulatory Visit (HOSPITAL_BASED_OUTPATIENT_CLINIC_OR_DEPARTMENT_OTHER): Payer: Self-pay

## 2024-04-08 MED ORDER — LISDEXAMFETAMINE DIMESYLATE 20 MG PO CAPS
20.0000 mg | ORAL_CAPSULE | Freq: Every day | ORAL | 0 refills | Status: DC
  Filled 2024-04-08: qty 30, 30d supply, fill #0

## 2024-04-25 ENCOUNTER — Encounter: Payer: Self-pay | Admitting: Physician Assistant

## 2024-04-27 ENCOUNTER — Ambulatory Visit: Admitting: Physician Assistant

## 2024-05-04 ENCOUNTER — Other Ambulatory Visit (HOSPITAL_BASED_OUTPATIENT_CLINIC_OR_DEPARTMENT_OTHER): Payer: Self-pay

## 2024-05-04 ENCOUNTER — Other Ambulatory Visit: Payer: Self-pay | Admitting: Radiology

## 2024-05-04 ENCOUNTER — Other Ambulatory Visit: Payer: Self-pay

## 2024-05-04 DIAGNOSIS — F419 Anxiety disorder, unspecified: Secondary | ICD-10-CM

## 2024-05-04 DIAGNOSIS — Z1331 Encounter for screening for depression: Secondary | ICD-10-CM

## 2024-05-04 MED ORDER — FLUOXETINE HCL 20 MG PO CAPS
20.0000 mg | ORAL_CAPSULE | Freq: Every day | ORAL | 1 refills | Status: DC
  Filled 2024-05-04: qty 30, 30d supply, fill #0
  Filled 2024-06-14: qty 30, 30d supply, fill #1

## 2024-05-04 NOTE — Telephone Encounter (Signed)
.  Med refill request: Prozac  Last AEX: 01/05/24 Next AEX: Not scheduled / sent a message to the front desk  Last MMG (if hormonal med) Refill authorized: Please Advise?

## 2024-05-13 ENCOUNTER — Other Ambulatory Visit: Payer: Self-pay | Admitting: Physician Assistant

## 2024-05-13 NOTE — Telephone Encounter (Signed)
 Pt requesting refill for Vyvanse  20 mg capsule. Last OV 02/29/2024.

## 2024-05-14 ENCOUNTER — Other Ambulatory Visit (HOSPITAL_BASED_OUTPATIENT_CLINIC_OR_DEPARTMENT_OTHER): Payer: Self-pay

## 2024-05-14 MED ORDER — LISDEXAMFETAMINE DIMESYLATE 20 MG PO CAPS
20.0000 mg | ORAL_CAPSULE | Freq: Every day | ORAL | 0 refills | Status: DC
  Filled 2024-05-14: qty 30, 30d supply, fill #0

## 2024-05-24 ENCOUNTER — Ambulatory Visit: Admitting: Physician Assistant

## 2024-06-03 ENCOUNTER — Other Ambulatory Visit (HOSPITAL_BASED_OUTPATIENT_CLINIC_OR_DEPARTMENT_OTHER): Payer: Self-pay

## 2024-06-03 MED ORDER — METRONIDAZOLE 500 MG PO TABS
500.0000 mg | ORAL_TABLET | Freq: Two times a day (BID) | ORAL | 0 refills | Status: DC
  Filled 2024-06-03: qty 20, 10d supply, fill #0

## 2024-06-14 ENCOUNTER — Other Ambulatory Visit: Payer: Self-pay | Admitting: Family

## 2024-06-15 ENCOUNTER — Other Ambulatory Visit: Payer: Self-pay

## 2024-06-24 ENCOUNTER — Other Ambulatory Visit (HOSPITAL_BASED_OUTPATIENT_CLINIC_OR_DEPARTMENT_OTHER): Payer: Self-pay

## 2024-06-30 ENCOUNTER — Encounter: Payer: Self-pay | Admitting: Physician Assistant

## 2024-07-01 NOTE — Telephone Encounter (Signed)
 Pt requesting refill for Vyvanse  20 mg capsule. Last OV 02/29/2024.

## 2024-07-05 ENCOUNTER — Ambulatory Visit: Admitting: Physician Assistant

## 2024-07-05 ENCOUNTER — Other Ambulatory Visit (HOSPITAL_BASED_OUTPATIENT_CLINIC_OR_DEPARTMENT_OTHER): Payer: Self-pay

## 2024-07-05 ENCOUNTER — Encounter: Payer: Self-pay | Admitting: Physician Assistant

## 2024-07-05 VITALS — BP 110/70 | HR 72 | Temp 97.9°F | Ht 61.0 in | Wt 171.5 lb

## 2024-07-05 DIAGNOSIS — F902 Attention-deficit hyperactivity disorder, combined type: Secondary | ICD-10-CM | POA: Diagnosis not present

## 2024-07-05 DIAGNOSIS — D509 Iron deficiency anemia, unspecified: Secondary | ICD-10-CM

## 2024-07-05 LAB — CBC WITH DIFFERENTIAL/PLATELET
Basophils Absolute: 0 K/uL (ref 0.0–0.1)
Basophils Relative: 0.5 % (ref 0.0–3.0)
Eosinophils Absolute: 0 K/uL (ref 0.0–0.7)
Eosinophils Relative: 0.7 % (ref 0.0–5.0)
HCT: 39 % (ref 36.0–46.0)
Hemoglobin: 13.2 g/dL (ref 12.0–15.0)
Lymphocytes Relative: 38.5 % (ref 12.0–46.0)
Lymphs Abs: 1.8 K/uL (ref 0.7–4.0)
MCHC: 33.7 g/dL (ref 30.0–36.0)
MCV: 91.8 fl (ref 78.0–100.0)
Monocytes Absolute: 0.4 K/uL (ref 0.1–1.0)
Monocytes Relative: 8.9 % (ref 3.0–12.0)
Neutro Abs: 2.4 K/uL (ref 1.4–7.7)
Neutrophils Relative %: 51.4 % (ref 43.0–77.0)
Platelets: 154 K/uL (ref 150.0–400.0)
RBC: 4.25 Mil/uL (ref 3.87–5.11)
RDW: 12.9 % (ref 11.5–15.5)
WBC: 4.6 K/uL (ref 4.0–10.5)

## 2024-07-05 LAB — IBC + FERRITIN
Ferritin: 41.9 ng/mL (ref 10.0–291.0)
Iron: 74 ug/dL (ref 42–145)
Saturation Ratios: 23.6 % (ref 20.0–50.0)
TIBC: 313.6 ug/dL (ref 250.0–450.0)
Transferrin: 224 mg/dL (ref 212.0–360.0)

## 2024-07-05 MED ORDER — LISDEXAMFETAMINE DIMESYLATE 30 MG PO CAPS
30.0000 mg | ORAL_CAPSULE | Freq: Every day | ORAL | 0 refills | Status: DC
  Filled 2024-09-20: qty 30, 30d supply, fill #0

## 2024-07-05 MED ORDER — LISDEXAMFETAMINE DIMESYLATE 30 MG PO CAPS
30.0000 mg | ORAL_CAPSULE | Freq: Every day | ORAL | 0 refills | Status: DC
  Filled 2024-07-05: qty 30, 30d supply, fill #0

## 2024-07-05 MED ORDER — LISDEXAMFETAMINE DIMESYLATE 30 MG PO CAPS
30.0000 mg | ORAL_CAPSULE | Freq: Every day | ORAL | 0 refills | Status: DC
  Filled 2024-08-05 – 2024-08-15 (×2): qty 30, 30d supply, fill #0

## 2024-07-05 NOTE — Patient Instructions (Signed)
 SABRA

## 2024-07-05 NOTE — Progress Notes (Signed)
 Katherine Marshall is a 38 y.o. female here for a follow up of a pre-existing problem.  History of Present Illness:   Chief Complaint  Patient presents with   ADHD    Pt here for f/u, currently taking Vyvanse  20 mg, tolerating well.    Discussed the use of AI scribe software for clinical note transcription with the patient, who gave verbal consent to proceed.  History of Present Illness Katherine Marshall is a 38 year old female who presents with fatigue and concerns about iron levels.  She experiences persistent fatigue and has been taking Slow Fe daily for the past three weeks to manage her iron levels. She tolerates the supplement well and is curious about her current iron status. She would like updated blood work today.  She takes Vyvanse  20 mg for ADHD, which initially worked well but became less effective after a month and a half. Adjusting the dose timing to 10 AM improved its effectiveness.     Past Medical History:  Diagnosis Date   Anxiety    Basal cell carcinoma    left side of face   Family history of brain cancer    Family history of breast cancer    Family history of melanoma    Family history of pancreatic cancer    GERD (gastroesophageal reflux disease)    Gestational thrombocytopenia without hemorrhage (HCC) 10/06/2016   Hypertension    PIH with last preg   Hypothyroidism    IBS (irritable bowel syndrome)    Kidney stones    Migraines    as a child   Pregnancy induced hypertension    Preterm delivery 2020   Vaginal delivery    20015, 2017, 2022     Social History   Tobacco Use   Smoking status: Never    Passive exposure: Never   Smokeless tobacco: Never  Vaping Use   Vaping status: Never Used  Substance Use Topics   Alcohol use: Yes    Alcohol/week: 1.0 - 2.0 standard drink of alcohol    Types: 1 - 2 Standard drinks or equivalent per week    Comment: occ glass of wine   Drug use: No    Past Surgical History:  Procedure Laterality Date   BASAL CELL  CARCINOMA EXCISION     DENTAL SURGERY      Family History  Problem Relation Age of Onset   Hypertension Mother    Pancreatic cancer Mother 83   Thyroid  cancer Father    Hypertension Father    Hypertension Brother    Diabetes Maternal Uncle    Non-Hodgkin's lymphoma Maternal Uncle 53   Melanoma Maternal Uncle    Cancer Maternal Uncle        sarcoma   Melanoma Paternal Aunt    Hypertension Maternal Grandmother    Heart attack Maternal Grandmother    Heart attack Maternal Grandfather    Colon cancer Paternal Grandmother 32   Hypertension Paternal Grandmother    Ulcerative colitis Paternal Grandmother    Brain cancer Paternal Grandfather 54       glioblastoma   Hypertension Paternal Grandfather    Melanoma Other        PGF's sister   Breast cancer Other        MGM's two sisters   Breast cancer Other        MGF's mother   Hearing loss Neg Hx     No Known Allergies  Current Medications:   Current Outpatient Medications:  FLUoxetine  (PROZAC ) 20 MG capsule, Take 1 capsule (20 mg total) by mouth daily., Disp: 30 capsule, Rfl: 1   levothyroxine  (SYNTHROID ) 88 MCG tablet, Take 1 tablet (88 mcg total) by mouth every morning on an empty stomach., Disp: 30 tablet, Rfl: 2   lisdexamfetamine (VYVANSE ) 30 MG capsule, Take 1 capsule (30 mg total) by mouth daily before breakfast., Disp: 30 capsule, Rfl: 0   [START ON 08/04/2024] lisdexamfetamine (VYVANSE ) 30 MG capsule, Take 1 capsule (30 mg total) by mouth daily before breakfast., Disp: 30 capsule, Rfl: 0   [START ON 09/03/2024] lisdexamfetamine (VYVANSE ) 30 MG capsule, Take 1 capsule (30 mg total) by mouth daily before breakfast., Disp: 30 capsule, Rfl: 0   LORazepam  (ATIVAN ) 0.5 MG tablet, Take 1 tablet (0.5 mg total) by mouth every 8 (eight) hours as needed for anxiety., Disp: 30 tablet, Rfl: 0   Review of Systems:   Negative unless otherwise specified per HPI.  Vitals:   Vitals:   07/05/24 1400  BP: 110/70  Pulse: 72   Temp: 97.9 F (36.6 C)  TempSrc: Temporal  SpO2: 99%  Weight: 171 lb 8 oz (77.8 kg)  Height: 5' 1 (1.549 m)     Body mass index is 32.4 kg/m.  Physical Exam:   Physical Exam Constitutional:      Appearance: Normal appearance. She is well-developed.  HENT:     Head: Normocephalic and atraumatic.  Eyes:     General: Lids are normal.     Extraocular Movements: Extraocular movements intact.     Conjunctiva/sclera: Conjunctivae normal.  Pulmonary:     Effort: Pulmonary effort is normal.  Musculoskeletal:        General: Normal range of motion.     Cervical back: Normal range of motion and neck supple.  Skin:    General: Skin is warm and dry.  Neurological:     Mental Status: She is alert and oriented to person, place, and time.  Psychiatric:        Attention and Perception: Attention and perception normal.        Mood and Affect: Mood normal.        Behavior: Behavior normal.        Thought Content: Thought content normal.        Judgment: Judgment normal.     Assessment and Plan:   Assessment and Plan Assessment & Plan Iron deficiency anemia  Persistent fatigue despite three weeks of iron supplementation. She inquired about current iron levels. - Order blood work to check iron stores.  Attention-deficit hyperactivity disorder Initial improvement with Vyvanse  diminished after six weeks. Adjusted dose timing provided slight improvement. Prefers single daily dose. Discussed increasing dose to enhance effectiveness. - Increase Vyvanse  to 30 mg daily. - Schedule follow-up every three months, can be virtual.      [name], PA-C

## 2024-07-06 ENCOUNTER — Ambulatory Visit: Payer: Self-pay | Admitting: Physician Assistant

## 2024-07-08 ENCOUNTER — Other Ambulatory Visit (HOSPITAL_BASED_OUTPATIENT_CLINIC_OR_DEPARTMENT_OTHER): Payer: Self-pay

## 2024-07-20 ENCOUNTER — Other Ambulatory Visit: Payer: Self-pay | Admitting: Radiology

## 2024-07-20 ENCOUNTER — Other Ambulatory Visit (HOSPITAL_BASED_OUTPATIENT_CLINIC_OR_DEPARTMENT_OTHER): Payer: Self-pay

## 2024-07-20 DIAGNOSIS — F419 Anxiety disorder, unspecified: Secondary | ICD-10-CM

## 2024-07-20 DIAGNOSIS — Z1331 Encounter for screening for depression: Secondary | ICD-10-CM

## 2024-07-20 MED ORDER — FLUOXETINE HCL 20 MG PO CAPS
20.0000 mg | ORAL_CAPSULE | Freq: Every day | ORAL | 0 refills | Status: DC
  Filled 2024-07-20: qty 30, 30d supply, fill #0

## 2024-07-20 NOTE — Telephone Encounter (Signed)
.  Med refill request: Fluoxetine   Last AEX: 01/05/24 Next AEX: not scheduled  Last MMG (if hormonal med) Refill authorized: Please Advise?

## 2024-08-04 ENCOUNTER — Other Ambulatory Visit (HOSPITAL_BASED_OUTPATIENT_CLINIC_OR_DEPARTMENT_OTHER): Payer: Self-pay

## 2024-08-05 ENCOUNTER — Other Ambulatory Visit (HOSPITAL_BASED_OUTPATIENT_CLINIC_OR_DEPARTMENT_OTHER): Payer: Self-pay

## 2024-08-15 ENCOUNTER — Other Ambulatory Visit: Payer: Self-pay | Admitting: Physician Assistant

## 2024-08-15 ENCOUNTER — Other Ambulatory Visit: Payer: Self-pay

## 2024-08-15 ENCOUNTER — Other Ambulatory Visit (HOSPITAL_BASED_OUTPATIENT_CLINIC_OR_DEPARTMENT_OTHER): Payer: Self-pay

## 2024-08-15 ENCOUNTER — Other Ambulatory Visit: Payer: Self-pay | Admitting: Radiology

## 2024-08-15 DIAGNOSIS — Z1331 Encounter for screening for depression: Secondary | ICD-10-CM

## 2024-08-15 DIAGNOSIS — F419 Anxiety disorder, unspecified: Secondary | ICD-10-CM

## 2024-08-15 MED ORDER — LEVOTHYROXINE SODIUM 88 MCG PO TABS
88.0000 ug | ORAL_TABLET | Freq: Every morning | ORAL | 5 refills | Status: AC
  Filled 2024-08-15: qty 30, 30d supply, fill #0
  Filled 2024-08-24 – 2024-09-06 (×2): qty 30, 30d supply, fill #1
  Filled 2024-10-17: qty 30, 30d supply, fill #2
  Filled 2024-11-22 (×2): qty 30, 30d supply, fill #3

## 2024-08-16 ENCOUNTER — Other Ambulatory Visit (HOSPITAL_BASED_OUTPATIENT_CLINIC_OR_DEPARTMENT_OTHER): Payer: Self-pay

## 2024-08-16 MED ORDER — FLUOXETINE HCL 20 MG PO CAPS
20.0000 mg | ORAL_CAPSULE | Freq: Every day | ORAL | 0 refills | Status: DC
  Filled 2024-08-16: qty 30, 30d supply, fill #0

## 2024-08-16 NOTE — Telephone Encounter (Signed)
 Med refill request: fluoxetine  (prozac ) Last AEX: 01/05/24 JC Next AEX: not yet scheduled Last MMG (if hormonal med) n/a Refill authorized: Please Advise? Last Rx sent #30 with zero refills on 07/20/24 JC

## 2024-08-24 ENCOUNTER — Other Ambulatory Visit (HOSPITAL_BASED_OUTPATIENT_CLINIC_OR_DEPARTMENT_OTHER): Payer: Self-pay

## 2024-08-25 ENCOUNTER — Other Ambulatory Visit (HOSPITAL_BASED_OUTPATIENT_CLINIC_OR_DEPARTMENT_OTHER): Payer: Self-pay

## 2024-08-31 ENCOUNTER — Ambulatory Visit: Payer: Managed Care, Other (non HMO) | Admitting: Dermatology

## 2024-09-06 ENCOUNTER — Other Ambulatory Visit (HOSPITAL_BASED_OUTPATIENT_CLINIC_OR_DEPARTMENT_OTHER): Payer: Self-pay

## 2024-09-06 ENCOUNTER — Other Ambulatory Visit: Payer: Self-pay

## 2024-09-09 ENCOUNTER — Other Ambulatory Visit (HOSPITAL_BASED_OUTPATIENT_CLINIC_OR_DEPARTMENT_OTHER): Payer: Self-pay

## 2024-09-20 ENCOUNTER — Other Ambulatory Visit (HOSPITAL_BASED_OUTPATIENT_CLINIC_OR_DEPARTMENT_OTHER): Payer: Self-pay

## 2024-09-30 ENCOUNTER — Other Ambulatory Visit (HOSPITAL_BASED_OUTPATIENT_CLINIC_OR_DEPARTMENT_OTHER): Payer: Self-pay

## 2024-09-30 ENCOUNTER — Other Ambulatory Visit: Payer: Self-pay | Admitting: Radiology

## 2024-09-30 ENCOUNTER — Other Ambulatory Visit: Payer: Self-pay

## 2024-09-30 DIAGNOSIS — Z1331 Encounter for screening for depression: Secondary | ICD-10-CM

## 2024-09-30 DIAGNOSIS — F419 Anxiety disorder, unspecified: Secondary | ICD-10-CM

## 2024-09-30 MED ORDER — FLUOXETINE HCL 20 MG PO CAPS
20.0000 mg | ORAL_CAPSULE | Freq: Every day | ORAL | 0 refills | Status: DC
  Filled 2024-09-30 (×2): qty 30, 30d supply, fill #0

## 2024-09-30 NOTE — Telephone Encounter (Signed)
 Med refill request:   FLUoxetine  (PROZAC ) 20 MG capsule  Start:  08/16/24 - Last dispensed: 08/24/24 Disp: 30 capsules Refills:  0  Last AEX:  01/05/24 Next AEX:  Not yet scheduled Last MMG (if hormonal med):  N/A Refill authorized? Please Advise.

## 2024-10-17 ENCOUNTER — Other Ambulatory Visit (HOSPITAL_BASED_OUTPATIENT_CLINIC_OR_DEPARTMENT_OTHER): Payer: Self-pay

## 2024-10-24 ENCOUNTER — Other Ambulatory Visit: Payer: Self-pay | Admitting: Obstetrics and Gynecology

## 2024-10-24 ENCOUNTER — Other Ambulatory Visit: Payer: Self-pay | Admitting: Physician Assistant

## 2024-10-24 DIAGNOSIS — F419 Anxiety disorder, unspecified: Secondary | ICD-10-CM

## 2024-10-24 DIAGNOSIS — Z1331 Encounter for screening for depression: Secondary | ICD-10-CM

## 2024-10-24 NOTE — Telephone Encounter (Signed)
 Med refill request: FLUOXETINE  (PROZAC ) 20 MG CAP Last AEX: 01/05/24 JC Next AEX: not yet scheduled Last MMG (if hormonal med) n/a Refill authorized: Please Advise? Last Rx sent #30 with zero refills on 09/30/24 [facility]

## 2024-10-24 NOTE — Telephone Encounter (Signed)
 Pt requesting refill for Vyvanse  30 mg capsule. Last OV 07/05/2024.

## 2024-10-24 NOTE — Telephone Encounter (Signed)
 Med refill request:Prozac  20 mg cap PO daily for depression/Anxiety.  Last AEX: 01/05/24 -JC Next AEX: Not scheduled Last MMG (if hormonal med) N/A  Call placed to patient, left detailed message, ok per dpr. Advised refill request received for Prozac  20 mg tab, are you still taking this dosage or have you weaned down? Can send MYChart update or return call to [phone], opt 4.   Will be due for next AEX after 01/05/25, contact the office at [phone], opt 1 for appts to schedule.

## 2024-10-25 ENCOUNTER — Other Ambulatory Visit (HOSPITAL_BASED_OUTPATIENT_CLINIC_OR_DEPARTMENT_OTHER): Payer: Self-pay

## 2024-10-25 ENCOUNTER — Other Ambulatory Visit: Payer: Self-pay

## 2024-10-25 MED ORDER — FLUOXETINE HCL 20 MG PO CAPS
20.0000 mg | ORAL_CAPSULE | Freq: Every day | ORAL | 0 refills | Status: DC
  Filled 2024-10-25: qty 30, 30d supply, fill #0

## 2024-10-25 MED ORDER — LIOTHYRONINE SODIUM 5 MCG PO TABS
ORAL_TABLET | ORAL | 1 refills | Status: DC
  Filled 2024-10-25: qty 60, 30d supply, fill #0

## 2024-10-28 ENCOUNTER — Other Ambulatory Visit: Payer: Self-pay | Admitting: Physician Assistant

## 2024-10-28 NOTE — Telephone Encounter (Signed)
 Received refill request for Vyvanse . Katherine Marshall refused needs an appt.

## 2024-10-31 ENCOUNTER — Encounter: Payer: Self-pay | Admitting: Physician Assistant

## 2024-11-01 ENCOUNTER — Ambulatory Visit: Admitting: Physician Assistant

## 2024-11-01 ENCOUNTER — Telehealth: Payer: Self-pay | Admitting: Physician Assistant

## 2024-11-01 NOTE — Telephone Encounter (Signed)
 Error

## 2024-11-03 ENCOUNTER — Encounter: Payer: Self-pay | Admitting: Physician Assistant

## 2024-11-03 ENCOUNTER — Telehealth: Admitting: Physician Assistant

## 2024-11-03 ENCOUNTER — Other Ambulatory Visit: Payer: Self-pay

## 2024-11-03 ENCOUNTER — Other Ambulatory Visit (HOSPITAL_BASED_OUTPATIENT_CLINIC_OR_DEPARTMENT_OTHER): Payer: Self-pay

## 2024-11-03 VITALS — Ht 61.0 in | Wt 165.0 lb

## 2024-11-03 DIAGNOSIS — F902 Attention-deficit hyperactivity disorder, combined type: Secondary | ICD-10-CM | POA: Diagnosis not present

## 2024-11-03 MED ORDER — LISDEXAMFETAMINE DIMESYLATE 30 MG PO CAPS: 30.0000 mg | ORAL_CAPSULE | Freq: Every day | ORAL | 0 refills | Status: AC

## 2024-11-03 MED ORDER — LISDEXAMFETAMINE DIMESYLATE 30 MG PO CAPS
30.0000 mg | ORAL_CAPSULE | Freq: Every day | ORAL | 0 refills | Status: AC
  Filled 2024-11-03: qty 30, 30d supply, fill #0

## 2024-11-03 NOTE — Progress Notes (Signed)
 "   Virtual Visit via Video Note   I, [name], connected with  Katherine Marshall  ([id number], Apr 27, 1986) on 11/03/24 at  8:40 AM EST by a video-enabled telemedicine application and verified that I am speaking with the correct person using two identifiers.  Location: Patient: Home Provider: [facility] [place] [facility] office   I discussed the limitations of evaluation and management by telemedicine and the availability of in person appointments. The patient expressed understanding and agreed to proceed.    Discussed the use of AI scribe software for clinical note transcription with the patient, who gave verbal consent to proceed.  History of Present Illness   Katherine Marshall is a 39 year old female with diabetes who presents with follow up of Attention Deficit Hyperactivity Disorder (ADHD) medication.  During the holidays she had increased fatigue, which she attributes to emotional stress after her mother's death last year, but she feels closer to baseline now.  She takes Vyvanse  30 mg daily, which effectively supports work and daily functioning, and she does not want a dose change. She uses high amounts of caffeine without perceived adverse effects and is trying to cut back.  She reports good sleep at night without disturbances.       Problems:  Patient Active Problem List   Diagnosis Date Noted   Genetic testing 07/29/2023   Family history of pancreatic cancer    Family history of breast cancer    Family history of melanoma    Family history of brain cancer    Insulin resistance 12/18/2022   Hypertension in pregnancy, preeclampsia, severe, delivered 05/14/2021   Hypothyroidism 05/14/2021   Anxiety 05/14/2021   Calculus of kidney 02/18/2016    Allergies: Allergies[1] Medications: Current Medications[2]  Observations/Objective: Patient is well-developed, well-nourished in no acute distress.  Resting comfortably  at home.  Head is normocephalic, atraumatic.  No labored  breathing.  Speech is clear and coherent with logical content.  Patient is alert and oriented at baseline.   Assessment and Plan: Assessment and Plan    Attention-deficit hyperactivity disorder ADHD well-managed with Vyvanse . No dose adjustment needed. No negative side effects reported. - Continue Vyvanse  at current dose of 30 mg daily - Sent prescription for Vyvanse  for three months.  - PDMP reviewed during this encounter.        Follow Up Instructions: I discussed the assessment and treatment plan with the patient. The patient was provided an opportunity to ask questions and all were answered. The patient agreed with the plan and demonstrated an understanding of the instructions.  A copy of instructions were sent to the patient via MyChart unless otherwise noted below.   The patient was advised to call back or seek an in-person evaluation if the symptoms worsen or if the condition fails to improve as anticipated.  [name], PA    [1] No Known Allergies [2]  Current Outpatient Medications:    FLUoxetine  (PROZAC ) 20 MG capsule, Take 1 capsule (20 mg total) by mouth daily., Disp: 30 capsule, Rfl: 0   levothyroxine  (SYNTHROID ) 88 MCG tablet, Take 1 tablet (88 mcg total) by mouth every morning on an empty stomach., Disp: 30 tablet, Rfl: 5   liothyronine  (CYTOMEL ) 5 MCG tablet, Take 1 tablet by mouth on an empty stomach for 7 days, then increase to 2 tablets once daily., Disp: 60 tablet, Rfl: 1   lisdexamfetamine  (VYVANSE ) 30 MG capsule, Take 1 capsule (30 mg total) by mouth daily before breakfast., Disp: 30 capsule, Rfl: 0   [  START ON 12/03/2024] lisdexamfetamine  (VYVANSE ) 30 MG capsule, Take 1 capsule (30 mg total) by mouth daily before breakfast., Disp: 30 capsule, Rfl: 0   [START ON 01/02/2025] lisdexamfetamine  (VYVANSE ) 30 MG capsule, Take 1 capsule (30 mg total) by mouth daily before breakfast., Disp: 30 capsule, Rfl: 0   LORazepam  (ATIVAN ) 0.5 MG tablet, Take 1 tablet (0.5 mg  total) by mouth every 8 (eight) hours as needed for anxiety., Disp: 30 tablet, Rfl: 0  "

## 2024-11-22 ENCOUNTER — Other Ambulatory Visit (HOSPITAL_BASED_OUTPATIENT_CLINIC_OR_DEPARTMENT_OTHER): Payer: Self-pay

## 2024-11-22 ENCOUNTER — Ambulatory Visit: Admitting: Physician Assistant

## 2024-11-22 ENCOUNTER — Encounter: Payer: Self-pay | Admitting: Physician Assistant

## 2024-11-22 ENCOUNTER — Other Ambulatory Visit: Payer: Self-pay

## 2024-11-22 ENCOUNTER — Other Ambulatory Visit: Payer: Self-pay | Admitting: Radiology

## 2024-11-22 VITALS — BP 120/84 | HR 75 | Temp 97.3°F | Ht 61.0 in | Wt 169.4 lb

## 2024-11-22 DIAGNOSIS — B9689 Other specified bacterial agents as the cause of diseases classified elsewhere: Secondary | ICD-10-CM

## 2024-11-22 DIAGNOSIS — Z1331 Encounter for screening for depression: Secondary | ICD-10-CM

## 2024-11-22 DIAGNOSIS — J019 Acute sinusitis, unspecified: Secondary | ICD-10-CM

## 2024-11-22 DIAGNOSIS — F419 Anxiety disorder, unspecified: Secondary | ICD-10-CM

## 2024-11-22 LAB — POC INFLUENZA A&B (BINAX/QUICKVUE)
Influenza A, POC: NEGATIVE
Influenza B, POC: NEGATIVE

## 2024-11-22 LAB — POC COVID19 BINAXNOW: SARS Coronavirus 2 Ag: NEGATIVE

## 2024-11-22 MED ORDER — FLUTICASONE PROPIONATE 50 MCG/ACT NA SUSP
2.0000 | Freq: Every day | NASAL | 1 refills | Status: AC
  Filled 2024-11-22: qty 16, 30d supply, fill #0

## 2024-11-22 MED ORDER — AMOXICILLIN-POT CLAVULANATE 400-57 MG/5ML PO SUSR
800.0000 mg | Freq: Two times a day (BID) | ORAL | 0 refills | Status: AC
  Filled 2024-11-22: qty 200, 7d supply, fill #0

## 2024-11-22 MED ORDER — FLUOXETINE HCL 20 MG PO CAPS
20.0000 mg | ORAL_CAPSULE | Freq: Every day | ORAL | 1 refills | Status: AC
  Filled 2024-11-22: qty 30, 30d supply, fill #0

## 2025-01-05 ENCOUNTER — Ambulatory Visit: Admitting: Radiology
